# Patient Record
Sex: Female | Born: 1950 | Race: Black or African American | Hispanic: No | Smoking: Former smoker
Health system: Southern US, Community
[De-identification: ages and names within clinical notes are randomized; demographics above are authoritative.]

## PROBLEM LIST (undated history)

## (undated) DIAGNOSIS — E782 Mixed hyperlipidemia: Secondary | ICD-10-CM

## (undated) DIAGNOSIS — I10 Essential (primary) hypertension: Secondary | ICD-10-CM

## (undated) DIAGNOSIS — Z9289 Personal history of other medical treatment: Secondary | ICD-10-CM

## (undated) DIAGNOSIS — Z973 Presence of spectacles and contact lenses: Secondary | ICD-10-CM

## (undated) DIAGNOSIS — Z972 Presence of dental prosthetic device (complete) (partial): Secondary | ICD-10-CM

## (undated) DIAGNOSIS — M1990 Unspecified osteoarthritis, unspecified site: Secondary | ICD-10-CM

## (undated) DIAGNOSIS — R55 Syncope and collapse: Secondary | ICD-10-CM

## (undated) DIAGNOSIS — M5030 Other cervical disc degeneration, unspecified cervical region: Secondary | ICD-10-CM

## (undated) DIAGNOSIS — E785 Hyperlipidemia, unspecified: Secondary | ICD-10-CM

## (undated) HISTORY — PX: COLONOSCOPY: SHX174

## (undated) HISTORY — PX: KNEE ARTHROSCOPY: SUR90

## (undated) HISTORY — DX: Mixed hyperlipidemia: E78.2

## (undated) HISTORY — DX: Hyperlipidemia, unspecified: E78.5

## (undated) HISTORY — DX: Other cervical disc degeneration, unspecified cervical region: M50.30

## (undated) HISTORY — DX: Personal history of other medical treatment: Z92.89

## (undated) HISTORY — DX: Presence of spectacles and contact lenses: Z97.3

## (undated) HISTORY — DX: Presence of dental prosthetic device (complete) (partial): Z97.2

## (undated) HISTORY — DX: Unspecified osteoarthritis, unspecified site: M19.90

## (undated) HISTORY — PX: PARTIAL HYSTERECTOMY: SHX80

## (undated) HISTORY — PX: LIPOMA EXCISION: SHX5283

## (undated) HISTORY — DX: Syncope and collapse: R55

---

## 1998-04-16 ENCOUNTER — Ambulatory Visit (HOSPITAL_COMMUNITY): Admission: RE | Admit: 1998-04-16 | Discharge: 1998-04-16 | Payer: Self-pay | Admitting: Pulmonary Disease

## 2000-10-15 ENCOUNTER — Other Ambulatory Visit: Admission: RE | Admit: 2000-10-15 | Discharge: 2000-10-15 | Payer: Self-pay | Admitting: Unknown Physician Specialty

## 2001-01-30 ENCOUNTER — Ambulatory Visit (HOSPITAL_COMMUNITY): Admission: RE | Admit: 2001-01-30 | Discharge: 2001-01-30 | Payer: Self-pay | Admitting: Pulmonary Disease

## 2001-01-30 ENCOUNTER — Encounter: Payer: Self-pay | Admitting: Pulmonary Disease

## 2001-04-28 ENCOUNTER — Encounter: Admission: RE | Admit: 2001-04-28 | Discharge: 2001-05-26 | Payer: Self-pay | Admitting: Neurosurgery

## 2004-03-27 ENCOUNTER — Ambulatory Visit (HOSPITAL_COMMUNITY): Admission: RE | Admit: 2004-03-27 | Discharge: 2004-03-27 | Payer: Self-pay | Admitting: Pulmonary Disease

## 2005-04-24 ENCOUNTER — Encounter: Admission: RE | Admit: 2005-04-24 | Discharge: 2005-04-24 | Payer: Self-pay | Admitting: General Surgery

## 2005-04-27 ENCOUNTER — Ambulatory Visit (HOSPITAL_BASED_OUTPATIENT_CLINIC_OR_DEPARTMENT_OTHER): Admission: RE | Admit: 2005-04-27 | Discharge: 2005-04-27 | Payer: Self-pay | Admitting: General Surgery

## 2005-04-27 ENCOUNTER — Encounter (INDEPENDENT_AMBULATORY_CARE_PROVIDER_SITE_OTHER): Payer: Self-pay | Admitting: Specialist

## 2005-04-27 ENCOUNTER — Ambulatory Visit (HOSPITAL_COMMUNITY): Admission: RE | Admit: 2005-04-27 | Discharge: 2005-04-27 | Payer: Self-pay | Admitting: General Surgery

## 2005-11-02 DIAGNOSIS — Z9289 Personal history of other medical treatment: Secondary | ICD-10-CM

## 2005-11-02 HISTORY — DX: Personal history of other medical treatment: Z92.89

## 2006-04-12 ENCOUNTER — Ambulatory Visit: Payer: Self-pay | Admitting: Cardiology

## 2006-04-21 ENCOUNTER — Encounter: Payer: Self-pay | Admitting: Cardiovascular Disease

## 2006-04-21 ENCOUNTER — Ambulatory Visit: Payer: Self-pay

## 2008-04-09 ENCOUNTER — Ambulatory Visit (HOSPITAL_COMMUNITY): Admission: RE | Admit: 2008-04-09 | Discharge: 2008-04-09 | Payer: Self-pay | Admitting: Pulmonary Disease

## 2010-11-02 DIAGNOSIS — R55 Syncope and collapse: Secondary | ICD-10-CM

## 2010-11-02 HISTORY — DX: Syncope and collapse: R55

## 2011-03-19 ENCOUNTER — Other Ambulatory Visit (HOSPITAL_COMMUNITY): Payer: Self-pay | Admitting: Pulmonary Disease

## 2011-03-19 DIAGNOSIS — G939 Disorder of brain, unspecified: Secondary | ICD-10-CM

## 2011-03-20 NOTE — Op Note (Signed)
NAME:  Nancy Castro, Nancy Castro               ACCOUNT NO.:  [id number]   MEDICAL RECORD NO.:  [id number]          PATIENT TYPE:  AMB   LOCATION:  DSC                          FACILITY:  [facility]   PHYSICIAN:  Leonie Man, M.D.   DATE OF BIRTH:  [birth year]   DATE OF PROCEDURE:  04/27/2005  DATE OF DISCHARGE:                                 OPERATIVE REPORT   PREOPERATIVE DIAGNOSES:  1.  Lipomata of the right flank x2.  2.  Skin tag of the left upper arm.   POSTOPERATIVE DIAGNOSES:  1.  Lipomata of the right flank x2.  2.  Skin tag of the left upper arm.   PROCEDURE:  1.  Excision of lipoma 15 cm from the right flank.  2.  Excision of lipoma 3 cm from the right flank.  3.  Excision of skin tag the left upper arm.   SURGEON:  Leonie Man, M.D.   ASSISTANT:  OR tech.   ANESTHESIA:  General.   SPECIMENS TO THE LAB:  One 15 cm lipoma, one 3 cm lipoma and a skin tag.   ESTIMATED BLOOD LOSS:  Minimal.   COMPLICATIONS:  No apparent complications. The patient returned to the PACU  in excellent condition.   The patient is a 60 year old female presenting with a large mass of the  right posterior lateral flank which on examination, is felt to be a large  lipoma.  She wishes to have this removed because it is causing some pressure  against her undergarments. She comes to the operating room now after risks  and potential benefits of surgery have been discussed. All questions  answered and consent obtained.  On evaluation, the patient also has a small  lipoma somewhat below this close to the thoracolumbar area and she has an  enlarging irritated skin tag on her left upper arm both of which she wants  also removed.   PROCEDURE:  Following the induction of satisfactory general anesthesia, the  patient is turned in the left lateral recumbent position. The right flank is  then prepped and draped to be included in a sterile operative field.  Incision is made over the larger of the two masses,  deepened through the  skin and subcutaneous tissue, carried down to the capsule of the mass where  the mass is dissected free from the surrounding soft tissues, carried down  to the latissimus dorsi of the back and completely excised.  Forwarded for  pathologic evaluation. Hemostasis assured with electrocautery and sponge and  instrument counts were verified. Subcutaneous tissues closed with  interrupted 3-0 Vicryl sutures and skin closed with running 4-0 Monocryl  suture. Attention then turned to the smaller of the two massed located  somewhat inferior and medial to the first.  This is approached through a  transverse incision, deepened through skin and subcutaneous tissues to the  capsule of the mass.  The mass was dissected free and removed in its  entirety and forwarded for pathologic evaluation. Hemostasis was assured  with electrocautery. The subcutaneous tissues closed with interrupted 3-0  Vicryl sutures and skin closed with a  running 4-0 Monocryl suture. Both  wounds reinforced with Steri-Strips and sterile dressings applied.   Attention then turned to the skin tag on the left forearm where this was  infiltrated with 0.5% Marcaine with epinephrine and the skin tag was cleanly  excised at its base and then cauterized. This was dressed with a Band-Aid.  The anesthetic reversed. The patient removed from the operating room to the  recovery room in stable condition. She tolerated the procedure well.       PB/MEDQ  D:  04/27/2005  T:  04/27/2005  Job:  [id number]

## 2011-03-24 ENCOUNTER — Ambulatory Visit (HOSPITAL_COMMUNITY)
Admission: RE | Admit: 2011-03-24 | Discharge: 2011-03-24 | Disposition: A | Discharge status: Home / Self Care | Payer: BC Managed Care – PPO | Source: Ambulatory Visit | Attending: Pulmonary Disease | Admitting: Pulmonary Disease

## 2011-03-24 ENCOUNTER — Encounter (HOSPITAL_COMMUNITY): Payer: Self-pay

## 2011-03-24 ENCOUNTER — Other Ambulatory Visit (HOSPITAL_COMMUNITY): Payer: Self-pay | Admitting: Pulmonary Disease

## 2011-03-24 DIAGNOSIS — Z9181 History of falling: Secondary | ICD-10-CM | POA: Insufficient documentation

## 2011-03-24 DIAGNOSIS — G939 Disorder of brain, unspecified: Secondary | ICD-10-CM

## 2011-03-24 DIAGNOSIS — R55 Syncope and collapse: Secondary | ICD-10-CM | POA: Insufficient documentation

## 2011-03-24 HISTORY — DX: Essential (primary) hypertension: I10

## 2011-03-24 MED ORDER — IOHEXOL 300 MG/ML  SOLN: 100.0000 mL | Freq: Once | INTRAMUSCULAR | Status: AC | PRN

## 2011-03-25 ENCOUNTER — Ambulatory Visit (HOSPITAL_COMMUNITY)
Admission: RE | Admit: 2011-03-25 | Discharge: 2011-03-25 | Disposition: A | Discharge status: Home / Self Care | Payer: BC Managed Care – PPO | Source: Ambulatory Visit | Attending: Pulmonary Disease | Admitting: Pulmonary Disease

## 2011-03-25 DIAGNOSIS — R55 Syncope and collapse: Secondary | ICD-10-CM

## 2011-04-24 ENCOUNTER — Encounter: Payer: Self-pay | Admitting: Family Medicine

## 2012-02-08 ENCOUNTER — Ambulatory Visit (INDEPENDENT_AMBULATORY_CARE_PROVIDER_SITE_OTHER): Payer: BC Managed Care – PPO | Admitting: Medical

## 2012-02-08 ENCOUNTER — Encounter: Payer: Self-pay | Admitting: Medical

## 2012-02-08 VITALS — BP 170/100 | HR 78 | Temp 98.4°F | Resp 16 | Ht 61.0 in | Wt 145.0 lb

## 2012-02-08 DIAGNOSIS — R55 Syncope and collapse: Secondary | ICD-10-CM

## 2012-02-08 DIAGNOSIS — I10 Essential (primary) hypertension: Secondary | ICD-10-CM | POA: Insufficient documentation

## 2012-02-08 DIAGNOSIS — Z79899 Other long term (current) drug therapy: Secondary | ICD-10-CM

## 2012-02-08 DIAGNOSIS — E785 Hyperlipidemia, unspecified: Secondary | ICD-10-CM | POA: Insufficient documentation

## 2012-02-08 LAB — CBC WITH DIFFERENTIAL/PLATELET
Basophils Absolute: 0 10*3/uL (ref 0.0–0.1)
Basophils Relative: 0 % (ref 0–1)
Eosinophils Absolute: 0 10*3/uL (ref 0.0–0.7)
Eosinophils Relative: 0 % (ref 0–5)
Hemoglobin: 12.9 g/dL (ref 12.0–15.0)
Lymphocytes Relative: 46 % (ref 12–46)
Lymphs Abs: 1.7 10*3/uL (ref 0.7–4.0)
MCHC: 32 g/dL (ref 30.0–36.0)
MCV: 102.8 fL — ABNORMAL HIGH (ref 78.0–100.0)
Neutro Abs: 1.6 10*3/uL — ABNORMAL LOW (ref 1.7–7.7)
Platelets: 318 10*3/uL (ref 150–400)
RBC: 3.92 MIL/uL (ref 3.87–5.11)
RDW: 12.6 % (ref 11.5–15.5)
WBC: 3.7 10*3/uL — ABNORMAL LOW (ref 4.0–10.5)

## 2012-02-08 LAB — COMPREHENSIVE METABOLIC PANEL
ALT: 14 U/L (ref 0–35)
AST: 19 U/L (ref 0–37)
Albumin: 4.5 g/dL (ref 3.5–5.2)
CO2: 27 mEq/L (ref 19–32)
Calcium: 10.4 mg/dL (ref 8.4–10.5)
Chloride: 101 mEq/L (ref 96–112)
Creat: 0.61 mg/dL (ref 0.50–1.10)
Glucose, Bld: 112 mg/dL — ABNORMAL HIGH (ref 70–99)
Sodium: 138 mEq/L (ref 135–145)
Total Bilirubin: 0.8 mg/dL (ref 0.3–1.2)
Total Protein: 7.4 g/dL (ref 6.0–8.3)

## 2012-02-08 LAB — POCT URINALYSIS DIPSTICK
Bilirubin, UA: NEGATIVE
Glucose, UA: NEGATIVE
Ketones, UA: NEGATIVE
Nitrite, UA: NEGATIVE
Spec Grav, UA: 1.025
Urobilinogen, UA: NEGATIVE
pH, UA: 5

## 2012-02-08 LAB — LIPID PANEL
Cholesterol: 277 mg/dL — ABNORMAL HIGH (ref 0–200)
HDL: 139 mg/dL (ref >39)
Total CHOL/HDL Ratio: 2 Ratio
Triglycerides: 81 mg/dL (ref <150)

## 2012-02-08 LAB — TSH: TSH: 2.74 u[IU]/mL (ref 0.350–4.500)

## 2012-02-08 NOTE — Patient Instructions (Signed)
We will check labs today, will try and get copies of prior records, and we will call with lab results.

## 2012-02-08 NOTE — Progress Notes (Signed)
Subjective:   HPI  Nancy Castro is a 61 y.o. female who presents as a new patient today.   She has been seeing Dr. Petra Kuba on [address] the last 18 years, but husband's insurer recently made them changes doctors.  Thus, she is here today to establish.    She would like her BP and cholesterol checked today.  Last check was 9/12, BP was elevated then, but cholesterol was ok.  She does notes some statin intolerance in the past.  Has tried Welchol, Lipitor and both gave her problems.  Can tolerate Crestor.     She does note 2012 having some passing out episodes, went on to have stress testing and head imaging, and no cause could be found.  Last episode was 03/2011.  She ended up quitting her job as she couldn't continue working her job due to dizziness, back pain and syncope.  Ended up seeing cardiology and neurology.  Reviewed their medical, surgical, family, social, medication, and allergy history and updated chart as appropriate.  Past Medical History  Diagnosis Date  . Hypertension   . Hyperlipidemia   . DDD (degenerative disc disease), cervical   . Syncope 2012    cardiac stress test      Review of Systems Constitutional: -fever, -chills, -sweats, -unexpected weight change, -anorexia, -fatigue Allergy: -sneezing, -itching, -congestion Dermatology: denies changing moles, rash, lumps, new worrisome lesions ENT: -runny nose, -ear pain, -sore throat, -hoarseness, -sinus pain, -teeth pain, -tinnitus, -hearing loss, -epistaxis Cardiology:  -chest pain, -palpitations, +edema, -orthopnea, -paroxysmal nocturnal dyspnea Respiratory: -cough, -shortness of breath, -dyspnea on exertion, -wheezing, -hemoptysis Gastroenterology: -abdominal pain, -nausea, -vomiting, -diarrhea, -constipation, -blood in stool, -changes in bowel movement, -dysphagia Hematology: -bleeding or bruising problems Musculoskeletal: -arthralgias, +myalgias, +joint swelling, +back pain, +neck pain, -cramping, -gait  changes Ophthalmology: -vision changes, -eye redness, -itching, -discharge Urology: -dysuria, -difficulty urinating, -hematuria, -urinary frequency, -urgency, incontinence Neurology: -headache, -weakness, -tingling, -numbness, -speech abnormality, -memory loss, +falls x 3, +dizziness Psychology:  -depressed mood, -agitation, +sleep problems      Objective:   Physical Exam  Filed Vitals:   02/08/12 1118  BP: 170/100  Pulse: 78  Temp: 98.4 F (36.9 C)  Resp: 16    General appearance: alert, no distress, WD/WN, pleasant black female HEENT: normocephalic, sclerae anicteric, TMs pearly, nares patent, no discharge or erythema, pharynx normal Oral cavity: MMM, no lesions, upper denture Neck: supple, no lymphadenopathy, no thyromegaly, no masses Heart: RRR, normal S1, S2, no murmurs Lungs: CTA bilaterally, no wheezes, rhonchi, or rales Abdomen: +bs, soft, non tender, non distended, no masses, no hepatomegaly, no splenomegaly Pulses: 2+ symmetric, upper and lower extremities, normal cap refill Neuro: CN2-12 intact, few episodes of clonus with LE exam, DTRs normal, otherwise non focal exam MSK: left index finger and thumb with some bony arthritis changes of DIPs and PIPs, no joint swelling   Assessment and Plan :    Encounter Diagnoses  Name Primary?  . Essential hypertension, benign Yes  . Hyperlipidemia   . Encounter for long-term (current) use of other medications   . Syncope    HTN - she hasn't taken her medication today.  Discussed compliance, not missing doses, and she is reportedly taking Benicar 40mg  daily. Labs today  Hyperlipemia - labs today.  We called [facility] and verified she is taking both Simcor and Trilipix daily.  Syncope - none since 03/2011.  We will request prior records from primary care, urgent care, cardiac stress testing and neurology.  UA today  with blood and urine, microscopic.  Will need to repeat in 2 wk.   Follow-up pending labs.

## 2012-02-12 NOTE — Progress Notes (Signed)
Lmom to cb. CLS 

## 2012-03-24 ENCOUNTER — Ambulatory Visit (INDEPENDENT_AMBULATORY_CARE_PROVIDER_SITE_OTHER): Payer: BC Managed Care – PPO | Admitting: Medical

## 2012-03-24 ENCOUNTER — Encounter: Payer: Self-pay | Admitting: Medical

## 2012-03-24 VITALS — BP 128/90 | HR 84 | Temp 97.7°F | Resp 16 | Wt 145.5 lb

## 2012-03-24 DIAGNOSIS — R3129 Other microscopic hematuria: Secondary | ICD-10-CM

## 2012-03-24 DIAGNOSIS — M62838 Other muscle spasm: Secondary | ICD-10-CM

## 2012-03-24 DIAGNOSIS — E785 Hyperlipidemia, unspecified: Secondary | ICD-10-CM

## 2012-03-24 DIAGNOSIS — I10 Essential (primary) hypertension: Secondary | ICD-10-CM

## 2012-03-24 DIAGNOSIS — R7301 Impaired fasting glucose: Secondary | ICD-10-CM

## 2012-03-24 DIAGNOSIS — M542 Cervicalgia: Secondary | ICD-10-CM

## 2012-03-24 LAB — POCT URINALYSIS DIPSTICK
Bilirubin, UA: NEGATIVE
Blood, UA: NEGATIVE
Glucose, UA: NEGATIVE
Ketones, UA: NEGATIVE
Spec Grav, UA: 1.015
Urobilinogen, UA: NEGATIVE
pH, UA: 5

## 2012-03-24 LAB — POCT GLYCOSYLATED HEMOGLOBIN (HGB A1C): Hemoglobin A1C: 114

## 2012-03-24 LAB — POCT CBG (FASTING - GLUCOSE)-MANUAL ENTRY: Glucose Fasting, POC: 5.3 mg/dL (ref 70–99)

## 2012-03-24 MED ORDER — MELOXICAM 15 MG PO TABS: 15.0000 mg | ORAL_TABLET | Freq: Every day | ORAL | Status: DC

## 2012-03-24 MED ORDER — NIACIN-SIMVASTATIN ER 500-20 MG PO TB24: 1.0000 | ORAL_TABLET | Freq: Every day | ORAL | Status: DC

## 2012-03-24 MED ORDER — OLMESARTAN MEDOXOMIL 40 MG PO TABS: 40.0000 mg | ORAL_TABLET | Freq: Every day | ORAL | Status: DC

## 2012-03-24 NOTE — Progress Notes (Signed)
  Subjective:   HPI  Nancy Castro is a 61 y.o. female who presents with 23mo recheck.  Since last visit started back on her blood pressure medication, is complaint with her cholesterol medication.    She notes longstanding problems with neck pain, hx/o disc disease in the neck.  Has been on Flexeril 10mg  BID for neck for a long time and this doesn't help.  Denies much exercise.  No other aggravating or relieving factors.    No other c/o.  The following portions of the patient's history were reviewed and updated as appropriate: allergies, current medications, past family history, past medical history, past social history, past surgical history and problem list.  Past Medical History  Diagnosis Date  . Hypertension   . Hyperlipidemia   . DDD (degenerative disc disease), cervical   . Syncope 2012    cardiac stress test   . Osteoarthritis     No Known Allergies   Review of Systems ROS reviewed and was negative other than noted in HPI or above.    Objective:   Physical Exam  General appearance: alert, no distress, WD/WN Oral cavity: MMM, no lesions Neck: supple, no lymphadenopathy, no thyromegaly, no masses, tender posterior and lateral neck, mild pain with ROM Back: tender supraspinatus, tender trapezius, otherwise nontender Heart: RRR, normal S1, S2, no murmurs Lungs: CTA bilaterally, no wheezes, rhonchi, or rales Pulses: 2+ symmetric, upper extremities MSK: UE nontender and normal ROM Neuro: normal UE strength and sensation and DTRs  Assessment and Plan :     Encounter Diagnoses  Name Primary?  . Essential hypertension, benign Yes  . Microscopic hematuria   . Impaired fasting blood sugar   . Cervicalgia   . Muscle spasm   . Hyperlipidemia    HTN - c/t same medication, refills today, reviewed her recent labs, recheck 236mo  Hematuria - urinalysis today normal, no further workup.   Impaired fasting glucose - fasting glucose today 114, HgbA1C of 5.3%.  Discussed diet  and regular exercise.   Recheck 236mo.   Cervicalgia and muscle spasm - discussed daily stretching routine, consider That Chi, and will begin trial of Mobic daily.   Hyperlipidemia - c/t same medication, recheck 236mo.   Discuss preventative care next visit.  She is UTD on mammogram, but never had a colonoscopy.

## 2012-04-22 ENCOUNTER — Telehealth: Payer: Self-pay | Admitting: Medical

## 2012-04-22 NOTE — Telephone Encounter (Signed)
LM

## 2012-05-10 DIAGNOSIS — Z0271 Encounter for disability determination: Secondary | ICD-10-CM

## 2012-05-20 ENCOUNTER — Telehealth: Payer: Self-pay | Admitting: Medical

## 2012-05-20 ENCOUNTER — Other Ambulatory Visit: Payer: Self-pay | Admitting: Medical

## 2012-05-20 MED ORDER — CYCLOBENZAPRINE HCL 10 MG PO TABS: 10.0000 mg | ORAL_TABLET | Freq: Two times a day (BID) | ORAL | Status: DC | PRN

## 2012-05-20 NOTE — Telephone Encounter (Signed)
Pt called and stated that mobic is causing her to be dizzy. She wants to go back on flexeril. Pt uses [facility].

## 2012-05-20 NOTE — Telephone Encounter (Signed)
Patient states that she didn't say that the flexrill didn't help. She states that is what she wanted to get back on. CLS

## 2012-05-20 NOTE — Telephone Encounter (Signed)
She had told me that flexeril didn't help.  We can try different anti-inflammatory if she'd like.  Stop the mobic if this is making her feel funny.  Would she be interested in seeing physical therapy for the neck issue?

## 2012-06-22 ENCOUNTER — Telehealth: Payer: Self-pay | Admitting: Internal Medicine

## 2012-06-22 MED ORDER — OLMESARTAN MEDOXOMIL 40 MG PO TABS: 40.0000 mg | ORAL_TABLET | Freq: Every day | ORAL | Status: DC

## 2012-06-22 MED ORDER — NIACIN-SIMVASTATIN ER 500-20 MG PO TB24: 1.0000 | ORAL_TABLET | Freq: Every day | ORAL | Status: DC

## 2012-06-22 NOTE — Telephone Encounter (Signed)
[facility] refills [facility]. CLS

## 2012-06-22 NOTE — Telephone Encounter (Signed)
[facility]

## 2012-09-26 ENCOUNTER — Other Ambulatory Visit: Payer: Self-pay | Admitting: Medical

## 2012-09-26 NOTE — Telephone Encounter (Signed)
Patient is aware that she needs a follow up appointment and she has an appointment for 10/10/12 @ 900 am. CLS

## 2012-09-26 NOTE — Telephone Encounter (Signed)
Rx refill on Flexeril.

## 2012-09-26 NOTE — Telephone Encounter (Signed)
pls make f/u appt.  Last visit, I believe she had elevated glucose and due for recheck at this time

## 2012-10-10 ENCOUNTER — Ambulatory Visit
Admission: RE | Admit: 2012-10-10 | Discharge: 2012-10-10 | Disposition: A | Discharge status: Home / Self Care | Payer: BC Managed Care – PPO | Source: Ambulatory Visit | Attending: Medical | Admitting: Medical

## 2012-10-10 ENCOUNTER — Ambulatory Visit (INDEPENDENT_AMBULATORY_CARE_PROVIDER_SITE_OTHER): Payer: BC Managed Care – PPO | Admitting: Medical

## 2012-10-10 ENCOUNTER — Encounter: Payer: Self-pay | Admitting: Medical

## 2012-10-10 VITALS — BP 120/80 | HR 80 | Temp 98.1°F | Resp 16 | Wt 151.0 lb

## 2012-10-10 DIAGNOSIS — Z23 Encounter for immunization: Secondary | ICD-10-CM

## 2012-10-10 DIAGNOSIS — E785 Hyperlipidemia, unspecified: Secondary | ICD-10-CM

## 2012-10-10 DIAGNOSIS — M25449 Effusion, unspecified hand: Secondary | ICD-10-CM

## 2012-10-10 DIAGNOSIS — M19049 Primary osteoarthritis, unspecified hand: Secondary | ICD-10-CM

## 2012-10-10 DIAGNOSIS — I10 Essential (primary) hypertension: Secondary | ICD-10-CM

## 2012-10-10 DIAGNOSIS — M542 Cervicalgia: Secondary | ICD-10-CM

## 2012-10-10 LAB — HEPATIC FUNCTION PANEL
ALT: 15 U/L (ref 0–35)
AST: 18 U/L (ref 0–37)
Albumin: 4.6 g/dL (ref 3.5–5.2)
Alkaline Phosphatase: 52 U/L (ref 39–117)
Total Bilirubin: 0.5 mg/dL (ref 0.3–1.2)
Total Protein: 6.9 g/dL (ref 6.0–8.3)

## 2012-10-10 LAB — LIPID PANEL
Cholesterol: 209 mg/dL — ABNORMAL HIGH (ref 0–200)
HDL: 87 mg/dL (ref >39)

## 2012-10-10 MED ORDER — CYCLOBENZAPRINE HCL 10 MG PO TABS: 10.0000 mg | ORAL_TABLET | Freq: Two times a day (BID) | ORAL | Status: DC | PRN

## 2012-10-10 MED ORDER — IBUPROFEN 800 MG PO TABS: 800.0000 mg | ORAL_TABLET | Freq: Two times a day (BID) | ORAL | Status: DC | PRN

## 2012-10-10 MED ORDER — OLMESARTAN MEDOXOMIL 40 MG PO TABS: 40.0000 mg | ORAL_TABLET | Freq: Every day | ORAL | Status: DC

## 2012-10-10 NOTE — Progress Notes (Signed)
Subjective: Here for f/u on several issues.   I saw her as new patient 02/2012.  She is here for f/u on things and other ongoing problems.  Hypertension - compliant with medication, no c/o.  Dyslipidemia - complaint with both Trilipix and Simcor.  exercises with walking, tries to eat healthy.  Neck pain ongoing.  Been on Flexeril BID for years.  Didn't tolerate mobic due to dizziness.  Her health care plan recommended she try Ibuprofen instead.  Hx/o consult with neurosurgery who recommended surgery, but she did not want to pursue this as that time.  Has changes joint bony changes and swelling of both hands.  Gets similar swelling in right knee.  Prior neurosurgeon advised that her left hand swells due to the neck issues, but she has new worsening bony changes.  She notes some morning stiffness but it doesn't last very long.  Often the hand pains improve as the day goes on.  She thinks she may have been diagnosed with RA, but not sure.   Says Dr. Petra Kuba did labs on her in 2010 or 2011 related to this.   Needs 90 day supply on most of her medications. Uses [facility].  Past Medical History  Diagnosis Date  . Hypertension   . Hyperlipidemia   . DDD (degenerative disc disease), cervical   . Syncope 2012    cardiac stress test   . Osteoarthritis    ROS as in HPI    Objective:   Physical Exam  Filed Vitals:   10/10/12 0909  BP: 120/80  Pulse: 80  Temp: 98.1 F (36.7 C)  Resp: 16    General appearance: alert, no distress, WD/WN, AA female Neck: mild bilat and posterior tenderness, ROM limited to about 75% of normal in all directions, no lymphadenopathy, no thyromegaly, no masses Heart: RRR, normal S1, S2, no murmurs Lungs: CTA bilaterally, no wheezes, rhonchi, or rales MSK: right hand with bony changes of index finger MCP and DIP, bony changes at thumb MCP, otherwise no bony changes, no swelling, rest of UE unremarkable Skin: unremarkable Pulses normal Ext: no obvious swelling,  cyanosis or clubbing Neuro: normal strength, sensation and DTRs UE  Assessment and Plan :    Encounter Diagnoses  Name Primary?  . Cervicalgia Yes  . Arthritis of hand   . Swelling of hand joint   . Dyslipidemia   . Essential hypertension, benign   . 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 DISC PROTRUSION IS ALSO NOTED POSTEROLATERALLY ON THE LEFT AT C5-6 EXTENDING TOWARDS THE FORAMEN. THERE IS A LARGER FORAMINAL HERNIATED NUCLEUS PULPOSUS ON THE LEFT AT C6-7. GIVEN THE FINDINGS JUST DESCRIBED, I FEEL THAT THE LEFT C6 AND LEFT C7 NERVE ROOTS  ARE AT RISK FOR COMPRESSION. DISC PROTRUSION POSTEROLATERALLY ON THE RIGHT AT C3-4 IS ALSO NOTED. BROAD BASED POSTERIOR ANNULAR BULGING/SUBLIGAMENTOUS DISC PROTRUSION AT C4-5.  She has used Flexeril long term BID.  She has seen neurosurgery prior.   Advised that at some point she may want to consider surgery if things worsen more.  C/t current medication management, c/t stretching and routine exercise.  At her request, we will hold off on additional eval at this time.  Will request prior records.   Arthritis hand, swelling of hand joints - will request records from prior rheumatology screening labs and prior records.  Sent for xray of both hands.  Stop mobic, begin trial of Ibuprofen.  Discussed risks  of NSAIDs including possible gastritis, ulcer disease, kidney and liver effects. Advised she limit this to 1-2 times daily prn.   Dyslipidemia - labs today.  pending labs, will need refills.  HTN - controlled, refills sent.  Flu vaccine, vaccine counseling and VIS given.  Follow-up pending labs

## 2012-10-11 ENCOUNTER — Other Ambulatory Visit: Payer: Self-pay | Admitting: Medical

## 2012-10-11 MED ORDER — CHOLINE FENOFIBRATE 135 MG PO CPDR: 135.0000 mg | DELAYED_RELEASE_CAPSULE | Freq: Every day | ORAL | Status: DC

## 2012-10-11 MED ORDER — NIACIN-SIMVASTATIN ER 500-20 MG PO TB24: 1.0000 | ORAL_TABLET | Freq: Every day | ORAL | Status: DC

## 2012-10-17 ENCOUNTER — Telehealth: Payer: Self-pay | Admitting: Medical

## 2012-10-19 NOTE — Telephone Encounter (Signed)
lm

## 2013-02-28 ENCOUNTER — Encounter: Payer: Self-pay | Admitting: Gastroenterology

## 2013-02-28 ENCOUNTER — Ambulatory Visit (INDEPENDENT_AMBULATORY_CARE_PROVIDER_SITE_OTHER): Payer: BC Managed Care – PPO | Admitting: Medical

## 2013-02-28 ENCOUNTER — Encounter: Payer: Self-pay | Admitting: Medical

## 2013-02-28 ENCOUNTER — Telehealth: Payer: Self-pay | Admitting: Family Medicine

## 2013-02-28 VITALS — BP 150/90 | HR 88 | Temp 98.1°F | Resp 16 | Ht 61.0 in | Wt 150.0 lb

## 2013-02-28 DIAGNOSIS — M25569 Pain in unspecified knee: Secondary | ICD-10-CM

## 2013-02-28 DIAGNOSIS — M25561 Pain in right knee: Secondary | ICD-10-CM

## 2013-02-28 DIAGNOSIS — I10 Essential (primary) hypertension: Secondary | ICD-10-CM

## 2013-02-28 DIAGNOSIS — R3129 Other microscopic hematuria: Secondary | ICD-10-CM

## 2013-02-28 DIAGNOSIS — Z13820 Encounter for screening for osteoporosis: Secondary | ICD-10-CM

## 2013-02-28 DIAGNOSIS — Z0000 Encounter for general adult medical examination without abnormal findings: Secondary | ICD-10-CM

## 2013-02-28 DIAGNOSIS — Z1211 Encounter for screening for malignant neoplasm of colon: Secondary | ICD-10-CM

## 2013-02-28 DIAGNOSIS — Z23 Encounter for immunization: Secondary | ICD-10-CM

## 2013-02-28 DIAGNOSIS — E782 Mixed hyperlipidemia: Secondary | ICD-10-CM | POA: Insufficient documentation

## 2013-02-28 LAB — CBC WITH DIFFERENTIAL/PLATELET
Basophils Absolute: 0 10*3/uL (ref 0.0–0.1)
Basophils Relative: 1 % (ref 0–1)
Eosinophils Absolute: 0 10*3/uL (ref 0.0–0.7)
HCT: 34.9 % — ABNORMAL LOW (ref 36.0–46.0)
Hemoglobin: 12.2 g/dL (ref 12.0–15.0)
Lymphocytes Relative: 48 % — ABNORMAL HIGH (ref 12–46)
Lymphs Abs: 1.6 10*3/uL (ref 0.7–4.0)
MCHC: 35 g/dL (ref 30.0–36.0)
Monocytes Absolute: 0.3 10*3/uL (ref 0.1–1.0)
Monocytes Relative: 9 % (ref 3–12)
Neutro Abs: 1.4 10*3/uL — ABNORMAL LOW (ref 1.7–7.7)
Neutrophils Relative %: 42 % — ABNORMAL LOW (ref 43–77)
RBC: 3.65 MIL/uL — ABNORMAL LOW (ref 3.87–5.11)
WBC: 3.2 10*3/uL — ABNORMAL LOW (ref 4.0–10.5)

## 2013-02-28 LAB — POCT URINALYSIS DIPSTICK
Bilirubin, UA: NEGATIVE
Glucose, UA: NEGATIVE
Ketones, UA: NEGATIVE
Nitrite, UA: NEGATIVE
pH, UA: 5

## 2013-02-28 LAB — LIPID PANEL
Cholesterol: 340 mg/dL — ABNORMAL HIGH (ref 0–200)
HDL: 116 mg/dL (ref >39)
LDL Cholesterol: 196 mg/dL — ABNORMAL HIGH (ref 0–99)
Total CHOL/HDL Ratio: 2.9 Ratio
Triglycerides: 141 mg/dL (ref <150)
VLDL: 28 mg/dL (ref 0–40)

## 2013-02-28 LAB — COMPREHENSIVE METABOLIC PANEL
ALT: 21 U/L (ref 0–35)
AST: 28 U/L (ref 0–37)
BUN: 16 mg/dL (ref 6–23)
CO2: 28 mEq/L (ref 19–32)
Calcium: 10.1 mg/dL (ref 8.4–10.5)
Chloride: 102 mEq/L (ref 96–112)
Creat: 0.58 mg/dL (ref 0.50–1.10)
Total Bilirubin: 0.6 mg/dL (ref 0.3–1.2)

## 2013-02-28 LAB — TSH: TSH: 2.426 u[IU]/mL (ref 0.350–4.500)

## 2013-02-28 NOTE — Telephone Encounter (Signed)
Patient is aware of her appointment for her colonscopy on May 28th, 2014 with Dr. Jarold Motto and her nurse visit on May 14,14 @ 230 pm. CLS [facility] [phone]

## 2013-02-28 NOTE — Progress Notes (Signed)
Subjective:   HPI  Nancy Castro is a 61 y.o. female who presents for a complete physical.   Preventative care: Last ophthalmology visit:Yes - [place]- 06/2012 Last dental visit:yes- [address] Last colonoscopy:never Last mammogram:2013  Last gynecological exam: sees gynecology Last EKG:yes- 2010 Last labs:02/08/2012  Prior vaccinations: TD or Tdap:2010 Influenza:10/2012 Pneumococcal:n/a Shingles/Zostavax:n/a  Advanced directive:n/a Health care power of attorney:n/a Living will:n/a  Concerns: hypertension - compliant, usually numbers are in normal range.  Today its up because she was rushing to get here.     hyperlipidemia - compliant with medication.    Right knee gets swelling occasional.   This is the knee she had surgery on in the past.  No recent fall, injury.  Reviewed their medical, surgical, family, social, medication, and allergy history and updated chart as appropriate.   Past Medical History  Diagnosis Date  . Hypertension   . DDD (degenerative disc disease), cervical   . Syncope 2012    cardiac stress test   . Osteoarthritis   . Wears glasses   . Wears partial dentures     upper and lower  . H/O mammogram 2013  . Mixed dyslipidemia   . History of exercise stress test 2007  . H/O echocardiogram 2007    Past Surgical History  Procedure Laterality Date  . Partial hysterectomy      fibroids; has both ovaries  . Knee arthroscopy      right  . Lipoma excision      back x 2  . Colonoscopy      never    Family History  Problem Relation Age of Onset  . Cancer Mother     died of lung cancer  . Cancer Father     died of lung cancer, mets to brain  . Hypertension Maternal Grandmother   . Heart disease Neg Hx   . Stroke Neg Hx   . Diabetes Neg Hx   . Cancer Sister     lung, mets to brain    History   Social History  . Marital Status: Married    Spouse Name: N/A    Number of Children: N/A  . Years of Education: N/A   Occupational  History  . Not on file.   Social History Main Topics  . Smoking status: Former Smoker    Quit date: 02/08/1987  . Smokeless tobacco: Not on file  . Alcohol Use: No  . Drug Use: No  . Sexually Active: Not on file   Other Topics Concern  . Not on file   Social History Narrative   Married, has 2 children, exercise - treadmill, most days of the week, retired, Oceanographer prior.  No grandchildren.   Sews - hobby    Current Outpatient Prescriptions on File Prior to Visit  Medication Sig Dispense Refill  . aspirin 81 MG tablet Take 81 mg by mouth daily.      . Choline Fenofibrate (TRILIPIX) 135 MG capsule Take 1 capsule (135 mg total) by mouth daily.  90 capsule  1  . cyclobenzaprine (FLEXERIL) 10 MG tablet Take 1 tablet (10 mg total) by mouth 2 (two) times daily as needed.  180 tablet  0  . ibuprofen (ADVIL,MOTRIN) 800 MG tablet Take 1 tablet (800 mg total) by mouth 2 (two) times daily as needed for pain.  60 tablet  1  . niacin-simvastatin (SIMCOR) 500-20 MG 24 hr tablet Take 1 tablet by mouth at bedtime.  90 tablet  1  . olmesartan (BENICAR) 40 MG tablet Take 1 tablet (40 mg total) by mouth daily.  90 tablet  3   No current facility-administered medications on file prior to visit.    No Known Allergies   Review of Systems Constitutional: -fever, -chills, -sweats, -unexpected weight change, -decreased appetite, -fatigue Allergy: -sneezing, -itching, -congestion Dermatology: -changing moles, --rash, -lumps ENT: -runny nose, -ear pain, -sore throat, -hoarseness, -sinus pain, -teeth pain, - ringing in ears, -hearing loss, -nosebleeds Cardiology: -chest pain, -palpitations, +swelling, -difficulty breathing when lying flat, -waking up short of breath Respiratory: -cough, -shortness of breath, -difficulty breathing with exercise or exertion, -wheezing, -coughing up blood Gastroenterology: -abdominal pain, -nausea, -vomiting, -diarrhea, -constipation, -blood in stool,  -changes in bowel movement, -difficulty swallowing or eating Hematology: -bleeding, -bruising  Musculoskeletal: -joint aches, -muscle aches, +joint swelling, -back pain, +neck pain, -cramping, -changes in gait Ophthalmology: denies vision changes, eye redness, itching, discharge Urology: -burning with urination, -difficulty urinating, -blood in urine, -urinary frequency, -urgency, -incontinence Neurology: -headache, -weakness, -tingling, -numbness, -memory loss, -falls, -dizziness Psychology: -depressed mood, -agitation, -sleep problems     Objective:   Physical Exam  Filed Vitals:   02/28/13 0931  BP: 150/90  Pulse: 88  Temp: 98.1 F (36.7 C)  Resp: 16    General appearance: alert, no distress, WD/WN, AA female Skin: flat patchy brown birth mark on right lateral shoulder HEENT: normocephalic, conjunctiva/corneas normal, sclerae anicteric, PERRLA, EOMi, nares patent, no discharge or erythema, pharynx normal Oral cavity: MMM, tongue normal, teeth with dentures present upper and lower Neck: supple, no lymphadenopathy, no thyromegaly, no masses, normal ROM, no bruits Chest: non tender, normal shape and expansion Heart: RRR, normal S1, S2, no murmurs Lungs: CTA bilaterally, no wheezes, rhonchi, or rales Abdomen: +bs, soft, non tender, non distended, no masses, no hepatomegaly, no splenomegaly, no bruits Back: non tender, normal ROM, no scoliosis Musculoskeletal: mild swelling of right knee, surgical scar, mild pain with flexion and extension, otherwise upper extremities non tender, no obvious deformity, normal ROM throughout, lower extremities non tender, no obvious deformity, normal ROM throughout Extremities: no edema, no cyanosis, no clubbing Pulses: 2+ symmetric, upper and lower extremities, normal cap refill Neurological: alert, oriented x 3, CN2-12 intact, strength normal upper extremities and lower extremities, sensation normal throughout, DTRs 2+ throughout, no cerebellar signs,  gait normal Psychiatric: normal affect, behavior normal, pleasant  Breast/gyn/rectal - deferred to gynecology    Adult ECG Report  Indication: physical, hypertension  Rate: 81 bpm  Rhythm: normal sinus rhythm  QRS Axis: 13 degrees  PR Interval: 130 ms  QRS Duration: 80ms  QTc:  Conduction Disturbances: none  Other Abnormalities: none  Patient's cardiac risk factors are: dyslipidemia and hypertension.  EKG comparison: none   Narrative Interpretation: normal EKG     Assessment and Plan :    Encounter Diagnoses  Name Primary?  . Routine general medical examination at a health care facility Yes  . Mixed dyslipidemia   . Essential hypertension, benign   . Knee pain, right   . Special screening for malignant neoplasms, colon   . Microscopic hematuria   . Screening for osteoporosis   . Need for shingles vaccine      Physical exam - discussed healthy lifestyle, diet, exercise, preventative care, vaccinations, and addressed their concerns.  Handout given.  Patient Instructions  Thank you for coming in for your physical today.   Make sure you see your gynecologist, eye doctor, and dentist yearly.  High cholesterol - continue  your current medications.  We will call with lab results and recommendations.  Eat a healthy low fat diet.  Hypertension - continue your current medication daily, exercise regularly, avoid added salt in your diet.  Knee pain - you can use some OTC Aleve occasionally.  Elevated the leg when swollen.  Consider ice pack 20 minutes on, 20 minutes off twice daily when painful or swollen.  Avoid deep bending.   If this worsens let me know . This is likely osteoarthritis changes  We will refer you for your first screening colonoscopy.  At some point in the next year we will consider screening for osteoporosis with a bone density scan.    Call your insurance company and ask about coverage for shingles vaccine.

## 2013-02-28 NOTE — Patient Instructions (Signed)
Thank you for coming in for your physical today.   Make sure you see your gynecologist, eye doctor, and dentist yearly.  High cholesterol - continue your current medications.  We will call with lab results and recommendations.  Eat a healthy low fat diet.  Hypertension - continue your current medication daily, exercise regularly, avoid added salt in your diet.  Knee pain - you can use some OTC Aleve occasionally.  Elevated the leg when swollen.  Consider ice pack 20 minutes on, 20 minutes off twice daily when painful or swollen.  Avoid deep bending.   If this worsens let me know . This is likely osteoarthritis changes  We will refer you for your first screening colonoscopy.  At some point in the next year we will consider screening for osteoporosis with a bone density scan.    Call your insurance company and ask about coverage for shingles vaccine.

## 2013-03-01 ENCOUNTER — Other Ambulatory Visit: Payer: Self-pay | Admitting: Medical

## 2013-03-01 MED ORDER — CHOLINE FENOFIBRATE 135 MG PO CPDR: 135.0000 mg | DELAYED_RELEASE_CAPSULE | Freq: Every day | ORAL | Status: DC

## 2013-03-01 MED ORDER — OLMESARTAN MEDOXOMIL 40 MG PO TABS: 40.0000 mg | ORAL_TABLET | Freq: Every day | ORAL | Status: DC

## 2013-03-01 MED ORDER — ATORVASTATIN CALCIUM 40 MG PO TABS: 40.0000 mg | ORAL_TABLET | Freq: Every day | ORAL | Status: DC

## 2013-03-01 MED ORDER — NIACIN ER (ANTIHYPERLIPIDEMIC) 500 MG PO TBCR: 500.0000 mg | EXTENDED_RELEASE_TABLET | Freq: Every day | ORAL | Status: DC

## 2013-03-04 LAB — URINE CULTURE: Colony Count: >=100000

## 2013-03-05 ENCOUNTER — Other Ambulatory Visit: Payer: Self-pay | Admitting: Medical

## 2013-03-05 MED ORDER — CIPROFLOXACIN HCL 500 MG PO TABS: 500.0000 mg | ORAL_TABLET | Freq: Two times a day (BID) | ORAL | Status: DC

## 2013-03-15 ENCOUNTER — Ambulatory Visit (AMBULATORY_SURGERY_CENTER): Payer: BC Managed Care – PPO | Admitting: *Deleted

## 2013-03-15 ENCOUNTER — Encounter: Payer: Self-pay | Admitting: Gastroenterology

## 2013-03-15 VITALS — Ht 61.0 in | Wt 150.2 lb

## 2013-03-15 DIAGNOSIS — Z1211 Encounter for screening for malignant neoplasm of colon: Secondary | ICD-10-CM

## 2013-03-15 MED ORDER — MOVIPREP 100 G PO SOLR: ORAL | Status: DC

## 2013-03-20 ENCOUNTER — Other Ambulatory Visit (INDEPENDENT_AMBULATORY_CARE_PROVIDER_SITE_OTHER): Payer: BC Managed Care – PPO

## 2013-03-20 DIAGNOSIS — R3129 Other microscopic hematuria: Secondary | ICD-10-CM

## 2013-03-20 LAB — POCT URINALYSIS DIPSTICK
Bilirubin, UA: NEGATIVE
Blood, UA: NEGATIVE
Ketones, UA: NEGATIVE
Leukocytes, UA: NEGATIVE
Nitrite, UA: NEGATIVE
Spec Grav, UA: 1.02
Urobilinogen, UA: NEGATIVE
pH, UA: 5

## 2013-03-29 ENCOUNTER — Ambulatory Visit (AMBULATORY_SURGERY_CENTER): Payer: BC Managed Care – PPO | Admitting: Gastroenterology

## 2013-03-29 ENCOUNTER — Encounter: Payer: Self-pay | Admitting: Gastroenterology

## 2013-03-29 VITALS — BP 131/78 | HR 68 | Temp 98.4°F | Resp 18 | Ht 61.0 in | Wt 150.0 lb

## 2013-03-29 DIAGNOSIS — Z1211 Encounter for screening for malignant neoplasm of colon: Secondary | ICD-10-CM

## 2013-03-29 DIAGNOSIS — D126 Benign neoplasm of colon, unspecified: Secondary | ICD-10-CM

## 2013-03-29 DIAGNOSIS — K5730 Diverticulosis of large intestine without perforation or abscess without bleeding: Secondary | ICD-10-CM

## 2013-03-29 MED ORDER — SODIUM CHLORIDE 0.9 % IV SOLN: 500.0000 mL | INTRAVENOUS | Status: DC

## 2013-03-29 NOTE — Progress Notes (Signed)
Called to room to assist during endoscopic procedure.  Patient ID and intended procedure confirmed with present staff. Received instructions for my participation in the procedure from the performing physician.  

## 2013-03-29 NOTE — Progress Notes (Signed)
Patient did not experience any of the following events: a burn prior to discharge; a fall within the facility; wrong site/side/patient/procedure/implant event; or a hospital transfer or hospital admission upon discharge from the facility. (G8907) Patient did not have preoperative order for IV antibiotic SSI prophylaxis. (G8918) Patient did not have preoperative order for IV antibiotic SSI prophylaxis. (G8918)  

## 2013-03-29 NOTE — Op Note (Signed)
[facility] [address] [place], [address]   COLONOSCOPY PROCEDURE REPORT  PATIENT: Nancy Castro, Nancy Castro  MR#: [id number] BIRTHDATE: 1951-10-12 , 61  yrs. old GENDER: Female ENDOSCOPIST: Mardella Layman, MD, [facility] REFERRED BY:  Kristian Covey, PA-C PROCEDURE DATE:  03/29/2013 PROCEDURE:   Colonoscopy with snare polypectomy ASA CLASS:   Class II INDICATIONS:Average risk patient for colon cancer. MEDICATIONS: propofol (Diprivan) 400mg  IV  DESCRIPTION OF PROCEDURE:   After the risks and benefits and of the procedure were explained, informed consent was obtained.  A digital rectal exam revealed no abnormalities of the rectum.    The [id number]  endoscope was introduced through the anus and advanced to the cecum, which was identified by both the appendix and ileocecal valve .  The quality of the prep was excellent, using MoviPrep .  The instrument was then slowly withdrawn as the colon was fully examined.     COLON FINDINGS: There was moderate diverticulosis noted throughout the entire examined colon with associated tortuosity, muscular hypertrophy and colonic spasm.   Three firm sessile polyps ranging between 3-43mm in size were found in the right colon.  A polypectomy was performed using snare cautery.  The resection was complete and the polyp tissue was completely retrieved.   A polypoid shaped pedunculated polyp ranging between 5-73mm in size was found at the splenic flexure.  A polypectomy was performed using snare cautery. The resection was complete and the polyp tissue was completely retrieved.   A smooth sessile polyp ranging between 3-79mm in size was found in the sigmoid colon.  A polypectomy was performed using snare cautery.  The resection was complete and the polyp tissue was completely retrieved.     Retroflexed views revealed no abnormalities.     The scope was then withdrawn from the patient and the procedure  completed.  COMPLICATIONS: There were no complications. ENDOSCOPIC IMPRESSION: 1.   There was moderate diverticulosis noted throughout the entire examined colon 2.   Three sessile polyps ranging between 3-10mm in size were found in the left colon; polypectomy was performed using snare cautery 3.   Pedunculated polyp ranging between 10-12 mm in size was found at the splenic flexure; polypectomy was performed using snare cautery 4.   Sessile polyp ranging between 3-9mm in size was found in the sigmoid colon; polypectomy was performed using snare cautery  RECOMMENDATIONS: 1.  Await pathology results 2.  Repeat Colonoscopy in 3 years.   REPEAT EXAM:  cc:  _______________________________ eSignedMardella Layman, MD, [facility] 03/29/2013 11:19 AM     PATIENT NAME:  Nancy Castro, Nancy Castro MR#: [id number]

## 2013-03-29 NOTE — Patient Instructions (Addendum)

## 2013-03-30 ENCOUNTER — Telehealth: Payer: Self-pay | Admitting: *Deleted

## 2013-03-30 NOTE — Telephone Encounter (Signed)
  Follow up Call-  Call back number 03/29/2013  Post procedure Call Back phone  # [phone]  Permission to leave phone message No     Patient questions:  Do you have a fever, pain , or abdominal swelling? no Pain Score  0 *  Have you tolerated food without any problems? yes  Have you been able to return to your normal activities? yes  Do you have any questions about your discharge instructions: Diet   no Medications  no Follow up visit  no  Do you have questions or concerns about your Care? no  Actions: * If pain score is 4 or above: No action needed, pain <4.

## 2013-04-04 ENCOUNTER — Encounter: Payer: Self-pay | Admitting: Gastroenterology

## 2013-09-20 LAB — HM MAMMOGRAPHY

## 2013-09-21 ENCOUNTER — Encounter: Payer: Self-pay | Admitting: Internal Medicine

## 2013-10-24 ENCOUNTER — Telehealth: Payer: Self-pay | Admitting: Family Medicine

## 2013-10-24 MED ORDER — ATORVASTATIN CALCIUM 40 MG PO TABS: 40.0000 mg | ORAL_TABLET | Freq: Once | ORAL | Status: DC

## 2013-10-24 NOTE — Telephone Encounter (Signed)
Done

## 2013-11-11 ENCOUNTER — Other Ambulatory Visit: Payer: Self-pay | Admitting: Medical

## 2013-11-13 NOTE — Telephone Encounter (Signed)
Is this okay to refill? 

## 2013-11-17 ENCOUNTER — Other Ambulatory Visit: Payer: Self-pay | Admitting: Medical

## 2014-03-15 ENCOUNTER — Ambulatory Visit (INDEPENDENT_AMBULATORY_CARE_PROVIDER_SITE_OTHER): Payer: BC Managed Care – PPO | Admitting: Medical

## 2014-03-15 ENCOUNTER — Encounter: Payer: Self-pay | Admitting: Medical

## 2014-03-15 VITALS — BP 130/70 | HR 80 | Temp 98.0°F | Resp 18 | Ht 61.0 in | Wt 143.0 lb

## 2014-03-15 DIAGNOSIS — I10 Essential (primary) hypertension: Secondary | ICD-10-CM

## 2014-03-15 DIAGNOSIS — Z0000 Encounter for general adult medical examination without abnormal findings: Secondary | ICD-10-CM

## 2014-03-15 DIAGNOSIS — R7301 Impaired fasting glucose: Secondary | ICD-10-CM

## 2014-03-15 DIAGNOSIS — E782 Mixed hyperlipidemia: Secondary | ICD-10-CM

## 2014-03-15 DIAGNOSIS — M1990 Unspecified osteoarthritis, unspecified site: Secondary | ICD-10-CM

## 2014-03-15 DIAGNOSIS — Z2882 Immunization not carried out because of caregiver refusal: Secondary | ICD-10-CM

## 2014-03-15 DIAGNOSIS — M2540 Effusion, unspecified joint: Secondary | ICD-10-CM

## 2014-03-15 LAB — BASIC METABOLIC PANEL
BUN: 18 mg/dL (ref 6–23)
CO2: 23 meq/L (ref 19–32)
Calcium: 9.9 mg/dL (ref 8.4–10.5)
Chloride: 104 mEq/L (ref 96–112)
Creat: 0.64 mg/dL (ref 0.50–1.10)
Glucose, Bld: 68 mg/dL — ABNORMAL LOW (ref 70–99)
Potassium: 4 mEq/L (ref 3.5–5.3)
Sodium: 139 mEq/L (ref 135–145)

## 2014-03-15 LAB — POCT URINALYSIS DIPSTICK
BILIRUBIN UA: NEGATIVE
Blood, UA: NEGATIVE
Glucose, UA: NEGATIVE
Ketones, UA: NEGATIVE
Leukocytes, UA: NEGATIVE
Nitrite, UA: NEGATIVE
Protein, UA: NEGATIVE
Spec Grav, UA: 1.02
Urobilinogen, UA: NEGATIVE
pH, UA: 5

## 2014-03-15 LAB — HEPATIC FUNCTION PANEL
ALBUMIN: 4.6 g/dL (ref 3.5–5.2)
ALT: 17 U/L (ref 0–35)
AST: 26 U/L (ref 0–37)
Alkaline Phosphatase: 65 U/L (ref 39–117)
Bilirubin, Direct: 0.1 mg/dL (ref 0.0–0.3)
Indirect Bilirubin: 0.4 mg/dL (ref 0.2–1.2)
Total Bilirubin: 0.5 mg/dL (ref 0.2–1.2)
Total Protein: 7.3 g/dL (ref 6.0–8.3)

## 2014-03-15 LAB — CBC
HEMATOCRIT: 34.8 % — AB (ref 36.0–46.0)
Hemoglobin: 11.3 g/dL — ABNORMAL LOW (ref 12.0–15.0)
MCH: 31.2 pg (ref 26.0–34.0)
MCHC: 32.5 g/dL (ref 30.0–36.0)
MCV: 96.1 fL (ref 78.0–100.0)
Platelets: 348 10*3/uL (ref 150–400)
RBC: 3.62 MIL/uL — ABNORMAL LOW (ref 3.87–5.11)
RDW: 13.7 % (ref 11.5–15.5)
WBC: 6.5 10*3/uL (ref 4.0–10.5)

## 2014-03-15 LAB — HEMOGLOBIN A1C
Hgb A1c MFr Bld: 5.5 % (ref <5.7)
Mean Plasma Glucose: 111 mg/dL (ref <117)

## 2014-03-15 NOTE — Progress Notes (Signed)
Subjective:   HPI  Nancy Castro is a 63 y.o. female who presents for a complete physical.   Preventative care: Last ophthalmology visit:2014 [address] Last dental visit:2014 [address] Last colonoscopy:2014 [place], polyps, repeat in 3 years Last mammogram:2014 Last gynecological exam: sees gynecology Last EKG:2007 Last labs:2014  Prior vaccinations: TD or Tdap:unsure, does not want Influenza:2014 Pneumococcal:no Shingles/Zostavax:no  Advanced directive: Health care power of attorney:yes Living will:yes  Concerns: Joint pains, worse deviation of right second and third fingers, right knee pain, neck pain all chronic arthritis pain  Reviewed their medical, surgical, family, social, medication, and allergy history and updated chart as appropriate.  Past Medical History  Diagnosis Date  . Hypertension   . DDD (degenerative disc disease), cervical   . Syncope 2012    cardiac stress test   . Osteoarthritis   . Wears glasses   . Wears partial dentures     upper and lower  . H/O mammogram 2013  . Mixed dyslipidemia   . History of exercise stress test 2007  . H/O echocardiogram 2007    Past Surgical History  Procedure Laterality Date  . Partial hysterectomy      fibroids; has both ovaries  . Knee arthroscopy      right  . Lipoma excision      back x 2  . Colonoscopy      never    History   Social History  . Marital Status: Married    Spouse Name: N/A    Number of Children: N/A  . Years of Education: N/A   Occupational History  . Not on file.   Social History Main Topics  . Smoking status: Former Smoker    Quit date: 02/08/1987  . Smokeless tobacco: Never Used  . Alcohol Use: Yes     Comment: "sometime" occ.  . Drug Use: No  . Sexual Activity: Not on file   Other Topics Concern  . 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  No  grandchildren.   Sews - hobby    Family History  Problem Relation Age of Onset  . Cancer Mother     died of lung cancer  . Cancer Father     died of lung cancer, mets to brain  . Hypertension Maternal Grandmother   . Heart disease Neg Hx   . Stroke Neg Hx   . Diabetes Neg Hx   . Colon cancer Neg Hx   . Cancer Sister     lung, mets to brain    Current outpatient prescriptions:aspirin 81 MG tablet, Take 81 mg by mouth daily., Disp: , Rfl: ;  atorvastatin (LIPITOR) 40 MG tablet, Take 1 tablet (40 mg total) by mouth once., Disp: 90 tablet, Rfl: 0;  Cholecalciferol (VITAMIN D3) 400 UNITS CHEW, Chew 2 tablets by mouth daily., Disp: , Rfl: ;  Choline Fenofibrate (TRILIPIX) 135 MG capsule, Take 1 capsule (135 mg total) by mouth daily., Disp: 90 capsule, Rfl: 1 cyclobenzaprine (FLEXERIL) 10 MG tablet, Take 1 tablet (10 mg total) by mouth 2 (two) times daily as needed., Disp: 180 tablet, Rfl: 0;  ibuprofen (ADVIL,MOTRIN) 800 MG tablet, Take 1 tablet (800 mg total) by mouth 2 (two) times daily as needed for pain., Disp: 60 tablet, Rfl: 1;  niacin (NIASPAN) 500 MG CR tablet, Take 1 tablet (500 mg total) by mouth at  bedtime., Disp: 90 tablet, Rfl: 1 olmesartan (BENICAR) 40 MG tablet, Take 1 tablet (40 mg total) by mouth daily., Disp: 90 tablet, Rfl: 3  No Known Allergies     Review of Systems Constitutional: -fever, -chills, -sweats, -unexpected weight change, -decreased appetite, -fatigue Allergy: -sneezing, -itching, -congestion Dermatology: -changing moles, --rash, -lumps ENT: -runny nose, -ear pain, -sore throat, -hoarseness, -sinus pain, -teeth pain, - ringing in ears, -hearing loss, -nosebleeds Cardiology: -chest pain, -palpitations, -swelling, -difficulty breathing when lying flat, -waking up short of breath Respiratory: -cough, -shortness of breath, -difficulty breathing with exercise or exertion, -wheezing, -coughing up blood Gastroenterology: -abdominal pain, -nausea, -vomiting,  -diarrhea, -constipation, -blood in stool, -changes in bowel movement, -difficulty swallowing or eating Hematology: -bleeding, -bruising  Musculoskeletal: +joint aches, -muscle aches,+joint swelling, -back pain, +neck pain, -cramping, -changes in gait Ophthalmology: denies vision changes, eye redness, itching, discharge Urology: -burning with urination, -difficulty urinating, -blood in urine, -urinary frequency, -urgency, -incontinence Neurology: -headache, -weakness, -tingling, -numbness, -memory loss, -falls, -dizziness Psychology: -depressed mood, -agitation, -sleep problems     Objective:   Physical Exam  BP 130/70  Pulse 80  Temp(Src) 98 F (36.7 C) (Oral)  Resp 18  Ht 5\' 1"  (1.549 m)  Wt 143 lb (64.864 kg)  BMI 27.03 kg/m2  General appearance: alert, no distress, WD/WN, AA female Skin: few scattered macules, including bilat orbits, no worrisome lesions HEENT: normocephalic, conjunctiva/corneas normal, sclerae anicteric, PERRLA, EOMi, nares patent, no discharge or erythema, pharynx normal Oral cavity: MMM, tongue normal, teeth -upper dentures Neck: supple, no lymphadenopathy, no thyromegaly, no masses, normal ROM, no bruits Chest: non tender, normal shape and expansion Heart: RRR, normal S1, S2, no murmurs Lungs: CTA bilaterally, no wheezes, rhonchi, or rales Abdomen: +bs, soft, non tender, non distended, no masses, no hepatomegaly, no splenomegaly, no bruits Back: Fatty lump on right lower back consistent with lipoma, surgical scar on back from prior lipoma ,non tender, normal ROM, no scoliosis Musculoskeletal: Right hand second and third MCP with bony arthritic changes, several PIPs with bony arthritic changes, tender of right hand first and second and third digits at the MCP, right knee with bony arthritic changes, mild effusion in general, tender along the joint line,  No laxity, otherwise, upper extremities non tender, no obvious deformity, normal ROM throughout, lower  extremities non tender, no obvious deformity, normal ROM throughout Extremities: no edema, no cyanosis, no clubbing Pulses: 2+ symmetric, upper and lower extremities, normal cap refill Neurological: alert, oriented x 3, CN2-12 intact, strength normal upper extremities and lower extremities, sensation normal throughout, DTRs 2+ throughout, no cerebellar signs, gait normal Psychiatric: normal affect, behavior normal, pleasant  Breast/gyn/rectal - deferred to gynecology   Assessment and Plan :    Encounter Diagnoses  Name Primary?  . Routine general medical examination at a health care facility Yes  . Mixed dyslipidemia   . Essential hypertension, benign   . Osteoarthritis   . Impaired fasting blood sugar   . Vaccine refused by parent   . Joint swelling     Physical exam - discussed healthy lifestyle, diet, exercise, preventative care, vaccinations, and addressed their concerns.  Handout given. Mixed dyslipidemia-lipoprofile labs today Hypertension-continue current medication Osteoarthritis, joint swelling-labs today, reviewed prior x-rays, will call back with recommendations Impaired fasting glucose-labs today She refuses shingles and Tdap vaccine today despite recommendations Follow-up pending labs

## 2014-03-16 ENCOUNTER — Other Ambulatory Visit: Payer: Self-pay | Admitting: Medical

## 2014-03-16 ENCOUNTER — Telehealth: Payer: Self-pay | Admitting: Medical

## 2014-03-16 LAB — NMR LIPOPROFILE WITH LIPIDS
Cholesterol, Total: 219 mg/dL — ABNORMAL HIGH (ref <200)
HDL Particle Number: 42 umol/L (ref >=30.5)
HDL Size: 10.5 nm (ref >=9.2)
HDL-C: 90 mg/dL (ref >=40)
LDL (calc): 119 mg/dL — ABNORMAL HIGH (ref <100)
LDL PARTICLE NUMBER: 1170 nmol/L — AB (ref <1000)
LDL Size: 21.1 nm (ref >20.5)
LP-IR Score: <25 (ref <=45)
Large HDL-P: >20 umol/L (ref >=4.8)
Large VLDL-P: 0.9 nmol/L (ref <=2.7)
Triglycerides: 49 mg/dL (ref <150)
VLDL Size: 49.6 nm — ABNORMAL HIGH (ref <=46.6)

## 2014-03-16 LAB — CYCLIC CITRUL PEPTIDE ANTIBODY, IGG: Cyclic Citrullin Peptide Ab: <2 U/mL (ref 0.0–5.0)

## 2014-03-16 LAB — ANA: Anti Nuclear Antibody(ANA): NEGATIVE

## 2014-03-16 LAB — SEDIMENTATION RATE: Sed Rate: 8 mm/hr (ref 0–22)

## 2014-03-16 MED ORDER — CYCLOBENZAPRINE HCL 10 MG PO TABS: 10.0000 mg | ORAL_TABLET | Freq: Two times a day (BID) | ORAL | Status: DC | PRN

## 2014-03-16 MED ORDER — MELOXICAM 15 MG PO TABS: 15.0000 mg | ORAL_TABLET | Freq: Every day | ORAL | Status: DC

## 2014-03-16 NOTE — Telephone Encounter (Signed)
Is this okay to refill? 

## 2014-03-19 ENCOUNTER — Other Ambulatory Visit: Payer: Self-pay | Admitting: Medical

## 2014-03-19 MED ORDER — ATORVASTATIN CALCIUM 40 MG PO TABS: 40.0000 mg | ORAL_TABLET | Freq: Once | ORAL | Status: DC

## 2014-04-11 NOTE — Telephone Encounter (Signed)
tsd  °

## 2014-05-11 ENCOUNTER — Telehealth: Payer: Self-pay | Admitting: Medical

## 2014-05-11 NOTE — Telephone Encounter (Signed)
Please call patient.  I was looking back over her chart and at the May visit her lipid numbers were not at goal.  I want to let her know about a lipid study.    We work with a company that does research studies and medication trials, called [employer].   They are currently doing a study on a medication for cholesterol.    Participants in the study receive compensation, free medication, close follow up and monitoring.   I wanted to let them know about this study because although she is on Lipitor high dose, the recent cholesterol was NOT at goal.   See if they have any interest in being part of a study.  If agreeable, pass on her info to [employer] and let me know.  If they desire not to be part of the study, that is fine.   We will continue their current medications and/or treatment as usual.

## 2014-05-17 NOTE — Telephone Encounter (Signed)
NO ANSWER, NO VOICEMAIL. CLS

## 2014-05-22 NOTE — Telephone Encounter (Signed)
LMOM TO CB. CLS 

## 2014-05-24 ENCOUNTER — Other Ambulatory Visit: Payer: Self-pay | Admitting: Medical

## 2014-05-24 MED ORDER — ROSUVASTATIN CALCIUM 40 MG PO TABS: 40.0000 mg | ORAL_TABLET | Freq: Every day | ORAL | Status: DC

## 2014-05-24 NOTE — Telephone Encounter (Signed)
Left message for patient to call back, re crestor rx.

## 2014-05-24 NOTE — Telephone Encounter (Signed)
Patient contacted [employer] and she is not eligible.

## 2014-05-24 NOTE — Telephone Encounter (Signed)
Ok, in that case, lets change to crestor as it works the best.  Plan to recheck lipids in 56mo.  Can give her coupon card.

## 2014-05-24 NOTE — Telephone Encounter (Signed)
Reported to patient. She will start Crestor instead of Lipitor and will call back for labwork appointment.

## 2014-10-17 ENCOUNTER — Encounter: Payer: 59 | Admitting: Medical

## 2014-10-24 ENCOUNTER — Other Ambulatory Visit: Payer: Self-pay | Admitting: Family Medicine

## 2014-10-24 ENCOUNTER — Encounter: Payer: Self-pay | Admitting: Medical

## 2014-10-24 ENCOUNTER — Telehealth: Payer: Self-pay | Admitting: Medical

## 2014-10-24 ENCOUNTER — Ambulatory Visit (INDEPENDENT_AMBULATORY_CARE_PROVIDER_SITE_OTHER): Payer: 59 | Admitting: Medical

## 2014-10-24 VITALS — BP 120/80 | HR 98 | Temp 98.1°F | Resp 14 | Wt 135.0 lb

## 2014-10-24 DIAGNOSIS — I10 Essential (primary) hypertension: Secondary | ICD-10-CM

## 2014-10-24 DIAGNOSIS — E785 Hyperlipidemia, unspecified: Secondary | ICD-10-CM

## 2014-10-24 DIAGNOSIS — R413 Other amnesia: Secondary | ICD-10-CM

## 2014-10-24 DIAGNOSIS — Z79899 Other long term (current) drug therapy: Secondary | ICD-10-CM

## 2014-10-24 DIAGNOSIS — D6489 Other specified anemias: Secondary | ICD-10-CM

## 2014-10-24 LAB — COMPREHENSIVE METABOLIC PANEL
ALT: 18 U/L (ref 0–35)
AST: 25 U/L (ref 0–37)
Albumin: 4.5 g/dL (ref 3.5–5.2)
Alkaline Phosphatase: 62 U/L (ref 39–117)
BILIRUBIN TOTAL: 0.3 mg/dL (ref 0.2–1.2)
BUN: 13 mg/dL (ref 6–23)
CALCIUM: 9.3 mg/dL (ref 8.4–10.5)
CO2: 25 mEq/L (ref 19–32)
Chloride: 105 mEq/L (ref 96–112)
Creat: 0.63 mg/dL (ref 0.50–1.10)
Glucose, Bld: 85 mg/dL (ref 70–99)
Potassium: 3.9 mEq/L (ref 3.5–5.3)
Sodium: 144 mEq/L (ref 135–145)
Total Protein: 7 g/dL (ref 6.0–8.3)

## 2014-10-24 NOTE — Telephone Encounter (Signed)
Chandra-please help me figure out what happened here.  I restarted her Lipitor in May, but for some reason Crestor was sent out in July and I can't see any correspondence to suggest why we have sent Crestor. I don't recall telling her to use Crestor, don't recall switching from Lipitor to Crestor but somehow she ended up getting Crestor in July and has been a stay doubling up taking Lipitor and Crestor.

## 2014-10-24 NOTE — Telephone Encounter (Signed)
I put the orders in EPIC for [facility] and they will contact the patient by phone for her appointment.

## 2014-10-24 NOTE — Telephone Encounter (Signed)
Refer to neurology [facility] ) for memory changes, concern from daughter, abnormal MMSE exam.

## 2014-10-24 NOTE — Progress Notes (Signed)
Subjective:   Nancy Castro is a 63 y.o. female presenting on 10/24/2014 with fasting medication check  She is accompanied by her daughter today from [place] who has questions about her memory and age.  She has hx/o HTN, dyslipidemia.   Hypertension-taking Benicar 40 mg daily. medicaion is somewhat expensive, but works well for her.  Dyslipidemia-per daughter and her report, she is taking Crestor 40 mg and Lipitor 40mg  daily, and niacin 500 mg daily, Aspirin daily.  At her last visit we had advise she start Lipitor and niacin but apparently she is still taking Crestor as well. Last visit in May after we did an NMR LipoProfile showing moderately elevated LDL particle numbers.    Last labs in may showed mild anemia.  Had colonoscopy last year.    Diet - eats healthy Exercise - does some walking  Her daughter's main concern today is her memory over the last several months she seems to forget things are sometimes can't remember certain things. She lives alone although there is some family close by including an aunt that stops by and sees her somewhat regularly.  There hasn't been any major concerns such as forgetting the stove being left on or bath water running or being lost in her car.  Minor but somewhat frequent small things.  She had has had a history of syncope between 2007 and 2012 that never was determined to be cardiac but she never saw neurology at the time.  She hasn't had any of these episodes since at least 2013.  No history of seizures. No other aggravating or relieving factors.  No other complaint.  Review of Systems ROS as in subjective      Objective:   BP 120/80 mmHg  Pulse 98  Temp(Src) 98.1 F (36.7 C) (Oral)  Resp 14  Wt 135 lb (61.236 kg)  Wt Readings from Last 3 Encounters:  10/24/14 135 lb (61.236 kg)  03/15/14 143 lb (64.864 kg)  03/29/13 150 lb (68.04 kg)    General appearance: alert, no distress, WD/WN HEENT: normocephalic, sclerae anicteric, TMs  pearly, nares patent, no discharge or erythema, pharynx normal Oral cavity: MMM, no lesions Neck: supple, no lymphadenopathy, no thyromegaly, no masses, no bruits Heart: RRR, normal S1, S2, no murmurs Lungs: CTA bilaterally, no wheezes, rhonchi, or rales Abdomen: +bs, soft, non tender, non distended, no masses, no hepatomegaly, no splenomegaly Pulses: 2+ symmetric, upper and lower extremities, normal cap refill Neuro: PERRLA, EOMI, CN II through XII intact, normal strength and sensation normal DTRs      Assessment: Encounter Diagnoses  Name Primary?  . Dyslipidemia Yes  . Essential hypertension   . Memory change   . High risk medication use   . 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 She will finish out the Crestor she has then will switch back to Lipitor.  She will continue niacin, daily aspirin, plan to recheck lipid panel in 3-4 months. CMET and liver test today Hypertension-for now continue Benicar 40 mg but consider changing in January to a cheaper option memory change-no mini status exam today with score of 26/30.  Normal clock face drawing.  Part of her recent symptoms could be related to mental fog from statin use and she was taking 2 statins at the same time. Nevertheless, refer to neurology for evaluation High risk medication-labs today Anemia-labs today  Nancy Castro was seen today for fasting medication check.  Diagnoses and associated orders for this visit:  Dyslipidemia - Comprehensive metabolic panel - CBC with Differential - Vitamin B12 - Folate - TSH  Essential hypertension - Comprehensive metabolic panel - CBC with Differential - Vitamin B12 - Folate - TSH  Memory change - Comprehensive metabolic panel - CBC with Differential - Vitamin B12 - Folate - TSH  High risk medication use - Comprehensive metabolic panel - CBC with Differential - Vitamin B12 - Folate - TSH  Anemia due to  other cause - Comprehensive metabolic panel - CBC with Differential - Vitamin B12 - Folate - TSH   Return pending labs.

## 2014-10-25 LAB — CBC WITH DIFFERENTIAL/PLATELET
Basophils Absolute: 0 10*3/uL (ref 0.0–0.1)
Basophils Relative: 1 % (ref 0–1)
Eosinophils Absolute: 0 10*3/uL (ref 0.0–0.7)
Eosinophils Relative: 0 % (ref 0–5)
HCT: 39 % (ref 36.0–46.0)
Hemoglobin: 12.5 g/dL (ref 12.0–15.0)
LYMPHS PCT: 52 % — AB (ref 12–46)
Lymphs Abs: 1.8 10*3/uL (ref 0.7–4.0)
MCH: 32.6 pg (ref 26.0–34.0)
MCHC: 32.1 g/dL (ref 30.0–36.0)
MCV: 101.8 fL — AB (ref 78.0–100.0)
MONO ABS: 0.2 10*3/uL (ref 0.1–1.0)
MPV: 10.7 fL (ref 9.4–12.4)
Monocytes Relative: 5 % (ref 3–12)
NEUTROS PCT: 42 % — AB (ref 43–77)
Neutro Abs: 1.5 10*3/uL — ABNORMAL LOW (ref 1.7–7.7)
Platelets: 337 10*3/uL (ref 150–400)
RBC: 3.83 MIL/uL — ABNORMAL LOW (ref 3.87–5.11)
RDW: 14.6 % (ref 11.5–15.5)
WBC: 3.5 10*3/uL — AB (ref 4.0–10.5)

## 2014-10-25 LAB — FOLATE: Folate: 4 ng/mL

## 2014-10-25 LAB — VITAMIN B12: Vitamin B-12: 500 pg/mL (ref 211–911)

## 2014-10-25 LAB — TSH: TSH: 1.1 u[IU]/mL (ref 0.350–4.500)

## 2014-10-29 ENCOUNTER — Telehealth: Payer: Self-pay | Admitting: Medical

## 2014-10-29 NOTE — Telephone Encounter (Signed)
I look through the patients chart and I'm not sure where the mix up happen. I  did speak to her daughter to make sure all medications was correct as of today. The states that everything is correct.

## 2014-10-29 NOTE — Telephone Encounter (Signed)
Pt called requesting refill on Benicar 40MG  to [facility] @ [facility]. Also rcvd refill request for Cyclobenzaprine, Niacin ER, Crestor, Atorvastatin, Meloxicam @ [facility]

## 2014-10-30 ENCOUNTER — Telehealth: Payer: Self-pay | Admitting: Family Medicine

## 2014-10-30 ENCOUNTER — Other Ambulatory Visit: Payer: Self-pay | Admitting: Medical

## 2014-10-30 MED ORDER — OLMESARTAN MEDOXOMIL 40 MG PO TABS: 40.0000 mg | ORAL_TABLET | Freq: Every day | ORAL | Status: DC

## 2014-10-30 MED ORDER — NIACIN ER (ANTIHYPERLIPIDEMIC) 500 MG PO TBCR: 500.0000 mg | EXTENDED_RELEASE_TABLET | Freq: Every day | ORAL | Status: DC

## 2014-10-30 NOTE — Telephone Encounter (Signed)
  Patient states that she takes Flexeril 2 x a day and she takes Melocicam 1 x a day.  Patient is aware of the message from Dorothea Ogle PA         Patient is requesting refills on her Flexeril and Meloxicam.

## 2014-10-30 NOTE — Telephone Encounter (Signed)
How often is she taking Flexeril?  How often is she using Meloxicam?  As we just discussed at recent visit, she has plenty of Crestor and Lipitor left.  She will finish out Crestor then switch to Lipitor.  I sent the Niacin and Benicar.

## 2014-10-30 NOTE — Telephone Encounter (Signed)
Patient states that she takes Flexeril 2 x a day and she takes Melocicam 1 x a day.  Patient is aware of the message from [facility] PA

## 2014-11-01 ENCOUNTER — Telehealth: Payer: Self-pay | Admitting: Internal Medicine

## 2014-11-01 ENCOUNTER — Other Ambulatory Visit: Payer: Self-pay | Admitting: Medical

## 2014-11-01 MED ORDER — CYCLOBENZAPRINE HCL 10 MG PO TABS: 10.0000 mg | ORAL_TABLET | Freq: Two times a day (BID) | ORAL | Status: DC | PRN

## 2014-11-01 NOTE — Telephone Encounter (Signed)
She states she has arthritis everywhere and needs something for pain daily. She states she was on flexeril when she cam to see you and then you provided her with Meloxicam. I told her we would advise her on what you would like her to try.

## 2014-11-01 NOTE — Telephone Encounter (Signed)
Now that we recently discussed her memory and overall medications, I don't like the fact she is taking Meloxicam every day and flexeril BID.  Flexeril is sedating, and meloxicam raises risk of GI bleeds and kidney damage.    I assume she still takes these regularly for neck pains?   Does she feel like she needs something for pain every day?    Lets come up with a different plan.

## 2014-11-01 NOTE — Telephone Encounter (Signed)
Refill request for crestor and atorvastatin to [facility]

## 2014-11-01 NOTE — Telephone Encounter (Signed)
Tried to call patient, phone was busy

## 2014-11-01 NOTE — Telephone Encounter (Signed)
No.  I don't understand . When she was here recently with her daughter, she had several Crestor tablets left and was taking this and Lipitor simultaneously.   I told her to STOP Lipitor for the time being so she wouldn't be doubling up, finish Crestor, then go back to Lipitor.   My understanding is that she has both tablets in plenty of quantity at home NOW.    Of note, when she was here, there was concern for her memory.   If needed, call her daughter and verify or clarify any discrepancy.  If needed I'll send Lipitor.

## 2014-11-01 NOTE — Telephone Encounter (Signed)
Nancy Castro ok to RF both crestor and atorvastatin?

## 2014-11-01 NOTE — Telephone Encounter (Signed)
Spoke with patient, she apparently did get Lipitor filled at the pharmacy recently and does not need it sent to mail order. She does have Crestor and Lipitor prescriptions at home. I advised her NOT to take both at the same time and she says she has not and will not. However she is planning on using up the Crestor she has and then going to Lipitor.

## 2014-11-01 NOTE — Telephone Encounter (Signed)
Ok, lets c/t same medications for now, plan recheck in 25mo, and lets discuss her pains further at that time.  This past visit we spent the bulk of the time on other issues, so we really didn't address pain and those medications like we need to

## 2014-11-05 NOTE — Telephone Encounter (Signed)
Patient is aware of Nancy Castro pAc message in detail

## 2014-11-08 ENCOUNTER — Other Ambulatory Visit: Payer: Self-pay | Admitting: Medical

## 2014-11-08 ENCOUNTER — Telehealth: Payer: Self-pay | Admitting: Family Medicine

## 2014-11-08 ENCOUNTER — Other Ambulatory Visit: Payer: Self-pay

## 2014-11-08 MED ORDER — ATORVASTATIN CALCIUM 40 MG PO TABS: 40.0000 mg | ORAL_TABLET | Freq: Every day | ORAL | Status: DC

## 2014-11-08 NOTE — Telephone Encounter (Signed)
Pt called and wanted refill of Atorvastatin sent to [facility] on [address]  Pt ph [phone]

## 2014-11-08 NOTE — Telephone Encounter (Signed)
OK to RF

## 2014-11-08 NOTE — Telephone Encounter (Signed)
pls call it out 

## 2014-11-08 NOTE — Telephone Encounter (Signed)
Called generic lipitor 40mg  1 po qd #90 0 rf to [address], patient aware, she did not want it sent to [place].

## 2014-12-03 ENCOUNTER — Ambulatory Visit: Payer: BC Managed Care – PPO | Admitting: Neurology

## 2014-12-03 ENCOUNTER — Ambulatory Visit (INDEPENDENT_AMBULATORY_CARE_PROVIDER_SITE_OTHER): Payer: 59 | Admitting: Neurology

## 2014-12-03 ENCOUNTER — Encounter: Payer: Self-pay | Admitting: Neurology

## 2014-12-03 VITALS — BP 130/76 | HR 91 | Resp 16 | Ht 61.0 in | Wt 140.0 lb

## 2014-12-03 DIAGNOSIS — I10 Essential (primary) hypertension: Secondary | ICD-10-CM

## 2014-12-03 DIAGNOSIS — E785 Hyperlipidemia, unspecified: Secondary | ICD-10-CM

## 2014-12-03 DIAGNOSIS — R413 Other amnesia: Secondary | ICD-10-CM

## 2014-12-03 NOTE — Patient Instructions (Signed)
1. Schedule MRI brain without contrast 2. Physical exercise and brain stimulation exercises are important for brain health 3. Continue control of BP and cholesterol with your PCP 4. Continue to monitor mood, as this may affect memory as well 5. Follow-up in 6 months

## 2014-12-03 NOTE — Progress Notes (Signed)
NEUROLOGY CONSULTATION NOTE  Nancy Castro MRN: [id number] DOB: [birth year]  Referring provider: Chana Bode, PA-C Primary care provider: Chana Bode, PA-C  Reason for consult:  Memory loss  Thank you for your kind referral of Nancy Castro for consultation of the above symptoms. Although her history is well known to you, please allow me to reiterate it for the purpose of our medical record. The patient was accompanied to the clinic by her daughter who also provides collateral information. Records and images were personally reviewed where available.  HISTORY OF PRESENT ILLNESS: This is a 64 year old right-handed woman with a history of hypertension, syncope, hyperlipidemia, presenting for evaluation of memory loss. She feels her memory is "all right." She might repeat herself, which she feels is normal for her age. Her daughter has been concerned about this, because she [place]. She states there are days when she is "spot on," but other days when she calls and the next day her mother does not recall their conversation. This has been ongoing for the past year. She reports that her mother separated from her father earlier in the year. She denies any personality changes in her mother. The patient denies missing her medications, she denies getting lost driving, no missed bill payments. She lives by herself and performs ADLs independently. There is no family history of memory loss.  She denies any headaches, dizziness, diplopia, dysarthria, dysphagia, focal numbness/tingling/weakness, bowel/bladder dysfunction. No tremors or anosmia. She has occasional neck pain. She denies any significant head injuries. They report syncopal episodes in the past with unremarkable cardiac workup, last episode was in 2012. No prior warning symptoms, she would wake up on the ground. She denies any episodes of staring/unresponsiveness, rising epigastric sensation, olfactory/gustatory  hallucinations, myoclonic jerks.   Laboratory Data: Lab Results  Component Value Date   WBC 3.5* 10/24/2014   HGB 12.5 10/24/2014   HCT 39.0 10/24/2014   MCV 101.8* 10/24/2014   PLT 337 10/24/2014     Chemistry      Component Value Date/Time   NA 144 10/24/2014 1050   K 3.9 10/24/2014 1050   CL 105 10/24/2014 1050   CO2 25 10/24/2014 1050   BUN 13 10/24/2014 1050   CREATININE 0.63 10/24/2014 1050      Component Value Date/Time   CALCIUM 9.3 10/24/2014 1050   ALKPHOS 62 10/24/2014 1050   AST 25 10/24/2014 1050   ALT 18 10/24/2014 1050   BILITOT 0.3 10/24/2014 1050     Lab Results  Component Value Date   TSH 1.100 10/24/2014   Lab Results  Component Value Date   VITAMINB12 500 10/24/2014     PAST MEDICAL HISTORY: Past Medical History  Diagnosis Date  . Hypertension   . DDD (degenerative disc disease), cervical   . Syncope 2012    cardiac stress test   . Osteoarthritis   . Wears glasses   . Wears partial dentures     upper and lower  . H/O mammogram 2013  . Mixed dyslipidemia   . History of exercise stress test 2007  . H/O echocardiogram 2007    PAST SURGICAL HISTORY: Past Surgical History  Procedure Laterality Date  . Partial hysterectomy      fibroids; has both ovaries  . Knee arthroscopy      right  . Lipoma excision      back x 2  . Colonoscopy      2014, polyps, repeat 3 years  MEDICATIONS: Current Outpatient Prescriptions on File Prior to Visit  Medication Sig Dispense Refill  . aspirin 81 MG tablet Take 81 mg by mouth daily.    . Cholecalciferol (VITAMIN D3) 400 UNITS CHEW Chew 2 tablets by mouth daily.    . cyclobenzaprine (FLEXERIL) 10 MG tablet Take 1 tablet (10 mg total) by mouth 2 (two) times daily as needed. 180 tablet 0  . ibuprofen (ADVIL,MOTRIN) 800 MG tablet Take 1 tablet (800 mg total) by mouth 2 (two) times daily as needed for pain. 60 tablet 1  . meloxicam (MOBIC) 15 MG tablet Take 1 tablet (15 mg total) by mouth daily. 30  tablet 1  . niacin (NIASPAN) 500 MG CR tablet Take 1 tablet (500 mg total) by mouth at bedtime. 90 tablet 1  . olmesartan (BENICAR) 40 MG tablet Take 1 tablet (40 mg total) by mouth daily. 90 tablet 0  . atorvastatin (LIPITOR) 40 MG tablet Take 1 tablet (40 mg total) by mouth daily. (Patient not taking: Reported on 12/03/2014) 90 tablet 0   No current facility-administered medications on file prior to visit.    ALLERGIES: No Known Allergies  FAMILY HISTORY: Family History  Problem Relation Age of Onset  . Cancer Mother     died of lung cancer  . Cancer Father     died of lung cancer, mets to brain  . Hypertension Maternal Grandmother   . Heart disease Neg Hx   . Stroke Neg Hx   . Diabetes Neg Hx   . Colon cancer Neg Hx   . Cancer Sister     lung, mets to brain    SOCIAL HISTORY: History   Social History  . Marital Status: Married    Spouse Name: N/A    Number of Children: N/A  . Years of Education: N/A   Occupational History  . Not on file.   Social History Main Topics  . Smoking status: Former Smoker    Quit date: 02/08/1987  . Smokeless tobacco: Never Used  . Alcohol Use: No  . Drug Use: No  . Sexual Activity: Not on file   Other Topics Concern  . 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  No grandchildren.   Sews - hobby    REVIEW OF SYSTEMS: Constitutional: No fevers, chills, or sweats, no generalized fatigue, change in appetite Eyes: No visual changes, double vision, eye pain Ear, nose and throat: No hearing loss, ear pain, nasal congestion, sore throat Cardiovascular: No chest pain, palpitations Respiratory:  No shortness of breath at rest or with exertion, wheezes GastrointestinaI: No nausea, vomiting, diarrhea, abdominal pain, fecal incontinence Genitourinary:  No dysuria, urinary retention or frequency Musculoskeletal:  + neck pain, no  back pain Integumentary: No rash, pruritus, skin lesions Neurological: as above Psychiatric: No depression, insomnia, anxiety Endocrine: No palpitations, fatigue, diaphoresis, mood swings, change in appetite, change in weight, increased thirst Hematologic/Lymphatic:  No anemia, purpura, petechiae. Allergic/Immunologic: no itchy/runny eyes, nasal congestion, recent allergic reactions, rashes  PHYSICAL EXAM: Filed Vitals:   12/03/14 0851  BP: 130/76  Pulse: 91  Resp: 16   General: No acute distress Head:  Normocephalic/atraumatic Eyes: Fundoscopic exam shows bilateral sharp discs, no vessel changes, exudates, or hemorrhages Neck: supple, no paraspinal tenderness, full range of motion Back: No paraspinal tenderness Heart: regular rate and rhythm Lungs: Clear to auscultation bilaterally. Vascular: No carotid  bruits. Skin/Extremities: No rash, no edema Neurological Exam: Mental status: alert and oriented to person, place, and time, no dysarthria or aphasia, Fund of knowledge is appropriate.  Remote memory intact.  Attention and concentration are normal.    Able to name objects and repeat phrases.  Montreal Cognitive Assessment  12/03/2014  Visuospatial/ Executive (0/5) 4  Naming (0/3) 3  Attention: Read list of digits (0/2) 1  Attention: Read list of letters (0/1) 1  Attention: Serial 7 subtraction starting at 100 (0/3) 2  Language: Repeat phrase (0/2) 2  Language : Fluency (0/1) 0  Abstraction (0/2) 2  Delayed Recall (0/5) 3  Orientation (0/6) 6  Total 24  Adjusted Score (based on education) 24   Cranial nerves: CN I: not tested CN II: pupils equal, round and reactive to light, visual fields intact, fundi unremarkable. CN III, IV, VI:  full range of motion, no nystagmus, no ptosis CN V: facial sensation intact CN VII: upper and lower face symmetric CN VIII: hearing intact to finger rub CN IX, X: gag intact, uvula midline CN XI: sternocleidomastoid and trapezius muscles  intact CN XII: tongue midline Bulk & Tone: normal, no fasciculations. Motor: 5/5 throughout with no pronator drift. Sensation: intact to light touch, cold, pin, vibration and joint position sense.  No extinction to double simultaneous stimulation.  Romberg test negative Deep Tendon Reflexes: +2 throughout, no ankle clonus Plantar responses: downgoing bilaterally Cerebellar: no incoordination on finger to nose, heel to shin. No dysdiadochokinesia Gait: narrow-based and steady, able to tandem walk adequately. Tremor: none  IMPRESSION: This is a 64 year old right-handed woman with vascular risk factors including hypertension and hyperlipidemia, presenting for evaluation of memory loss. Her daughter has noticed short-term memory changes which the patient denies. Her MOCA score today is 24/30 (normal >26/30), indicating mild cognitive impairment. Otherwise non-focal exam. We discussed different causes of memory changes. TSH and B12 normal. MRI brain without contrast will be ordered to assess for underlying structural abnormality and vascular load. We discussed pseudodementia and effects of mood on memory, her daughter did endorse stress since her recent separation from her husband. We discussed the importance of control of vascular risk factors, physical exercise and brain stimulation exercises for brain health. We discussed the option for starting low dose cholinesterase inhibitors such as Aricept, side effects and expectations from the medication, she would like to hold off for now. She will follow-up in 6 months.   Thank you for allowing me to participate in the care of this patient. Please do not hesitate to call for any questions or concerns.   Ellouise Newer, M.D.

## 2014-12-04 ENCOUNTER — Encounter: Payer: Self-pay | Admitting: Neurology

## 2014-12-17 ENCOUNTER — Telehealth: Payer: Self-pay | Admitting: Family Medicine

## 2014-12-17 NOTE — Telephone Encounter (Signed)
Patient called. She wanted to let you know that she had to cancel MRI due to expense. Her out of pocket would have been $2000.

## 2014-12-18 ENCOUNTER — Other Ambulatory Visit: Payer: Self-pay | Admitting: Family Medicine

## 2014-12-18 ENCOUNTER — Telehealth: Payer: Self-pay | Admitting: Medical

## 2014-12-18 ENCOUNTER — Ambulatory Visit (HOSPITAL_COMMUNITY)
Admission: RE | Admit: 2014-12-18 | Discharge: 2014-12-18 | Disposition: A | Discharge status: Home / Self Care | Payer: 59 | Source: Ambulatory Visit | Attending: Neurology | Admitting: Neurology

## 2014-12-18 DIAGNOSIS — R413 Other amnesia: Secondary | ICD-10-CM | POA: Diagnosis present

## 2014-12-18 DIAGNOSIS — R930 Abnormal findings on diagnostic imaging of skull and head, not elsewhere classified: Secondary | ICD-10-CM | POA: Diagnosis not present

## 2014-12-18 DIAGNOSIS — M479 Spondylosis, unspecified: Secondary | ICD-10-CM | POA: Insufficient documentation

## 2014-12-18 MED ORDER — NIACIN ER (ANTIHYPERLIPIDEMIC) 500 MG PO TBCR: 500.0000 mg | EXTENDED_RELEASE_TABLET | Freq: Every day | ORAL | Status: DC

## 2014-12-18 MED ORDER — ATORVASTATIN CALCIUM 40 MG PO TABS: 40.0000 mg | ORAL_TABLET | Freq: Every day | ORAL | Status: DC

## 2014-12-18 NOTE — Telephone Encounter (Signed)
Pt stopped by and requested refills on lipitor and niacin sent to [facility]. Pt can be reached at [phone]

## 2014-12-18 NOTE — Telephone Encounter (Signed)
Lmom to return my call. 

## 2014-12-18 NOTE — Telephone Encounter (Signed)
Can she do head CT without contrast? If not, we can discuss on follow-up visit. Thanks

## 2014-12-18 NOTE — Telephone Encounter (Signed)
Medications was sent to her pharmacy

## 2014-12-20 ENCOUNTER — Telehealth: Payer: Self-pay | Admitting: Family Medicine

## 2014-12-20 NOTE — Telephone Encounter (Signed)
Lmovm to return my call. 

## 2014-12-20 NOTE — Telephone Encounter (Signed)
-----   Message from Cameron Sprang, MD sent at 12/20/2014 12:52 PM EST ----- Pls let her know I reviewed MRI brain, it is normal, no evidence of tumor, stroke, or bleed. Thanks

## 2014-12-24 NOTE — Telephone Encounter (Signed)
Left another msg for patient to return my call. Need to speak with her about MRI results.

## 2014-12-27 ENCOUNTER — Encounter: Payer: Self-pay | Admitting: Family Medicine

## 2014-12-27 NOTE — Telephone Encounter (Signed)
Result letter mailed to patient after not being able to reach by phone.

## 2014-12-31 ENCOUNTER — Telehealth: Payer: Self-pay | Admitting: Family Medicine

## 2014-12-31 MED ORDER — ATORVASTATIN CALCIUM 40 MG PO TABS: 40.0000 mg | ORAL_TABLET | Freq: Every day | ORAL | Status: DC

## 2014-12-31 NOTE — Telephone Encounter (Signed)
Pt was told after finish her Crestor for her to start her Lipitor.  It looks like the Lipitor was ordered and then cancelled.  Patient needs Lipitor, not sure why cancelled.  Please send to [facility]

## 2014-12-31 NOTE — Telephone Encounter (Signed)
done

## 2015-01-02 ENCOUNTER — Other Ambulatory Visit: Payer: Self-pay | Admitting: Medical

## 2015-01-02 ENCOUNTER — Telehealth: Payer: Self-pay | Admitting: Family Medicine

## 2015-01-02 MED ORDER — NIACIN ER (ANTIHYPERLIPIDEMIC) 500 MG PO TBCR: 500.0000 mg | EXTENDED_RELEASE_TABLET | Freq: Every day | ORAL | Status: DC

## 2015-01-02 NOTE — Telephone Encounter (Signed)
I sent different form of Niacin to pharmacy.  F/u was advised 2-3 mo, so will be due end of March fasting.

## 2015-01-02 NOTE — Telephone Encounter (Signed)
Pt called and she went to pick up her Niacin and she cannot afford it.  Please call in generic to [facility] on [address].

## 2015-01-02 NOTE — Telephone Encounter (Signed)
Due for f/u anyhow, so have her come in fasting.

## 2015-01-02 NOTE — Telephone Encounter (Signed)
Patient states that she was just in here on 10/25/15. Your last OV note plan said to follow up here fasting in 3 to 4 months so please advise about her medication.

## 2015-01-03 NOTE — Telephone Encounter (Signed)
Reported and scheduled appointment 01/30/15

## 2015-01-07 NOTE — Telephone Encounter (Signed)
Pt states that the niacin/niaspan is still over $100 and she can't afford that. Pt needs something else sent in and then also needs lipitor refilled. Send to [facility]

## 2015-01-07 NOTE — Telephone Encounter (Signed)
pls call pharmacy to see what generic form is available or what they recommend.   I am only seeing XR versions in Epic and warnings about not using regular release dosing

## 2015-01-08 NOTE — Telephone Encounter (Signed)
I spoke with the pharmacist who said that the Niacin is the generic to the Niaspan. He said he doesn't know of anything else except they have a OTC Niacin controlled release she could try.

## 2015-01-09 ENCOUNTER — Other Ambulatory Visit: Payer: Self-pay | Admitting: Medical

## 2015-01-09 MED ORDER — NIACIN 250 MG PO TABS: ORAL_TABLET | ORAL | Status: DC

## 2015-01-09 NOTE — Telephone Encounter (Signed)
If possible, I would try and call her daughter who came in last visit.  There was already a concern about memory loss, and although she has seen neurology, this phone call would suggests that maybe she is forgetting things.   I do recommend a follow up visit

## 2015-01-09 NOTE — Telephone Encounter (Signed)
Since insurance is giving [place] a road block I sent regular release Niacin, take 2 tablets BID.   F/u in 6wk fasting.

## 2015-01-09 NOTE — Telephone Encounter (Signed)
I called and I spoke with the patients Daughter Nancy Castro about the last 2 conversations that I had with her regarding her medications . The daughter states that she will speak with her mom and go over medications with her over the phone. I told the daughter that she can call me back if she needs to. She states that they had a death in the family and she may be a little confused from that. She said she appreciates [place] calling her.

## 2015-01-09 NOTE — Telephone Encounter (Signed)
I went over the message from [facility] and I told her sent another Rx of Niacin to her pharmacy.

## 2015-01-09 NOTE — Telephone Encounter (Signed)
Patient proceeds to ask me about Lipitor Rx and it hasn't been ordered yet. I look in the computer and it was ordered on 12/31/14 # 33. I called over to [facility] and spoke with the pharmacy tech. Who states that Rx was there. It was pick up and paid for on 01/01/15 for # 90. I called the patient back to let her know what the pharmacy said. She states that she doesn't have a Rx for Niacin I explain to her that I'm talking about her Niacin but I'm speaking about her Lipitor  that she states was not sent to the pharmacy. She states that she doesn't have any Lipitor.  Please, advise on what to do next

## 2015-01-30 ENCOUNTER — Ambulatory Visit (INDEPENDENT_AMBULATORY_CARE_PROVIDER_SITE_OTHER): Payer: 59 | Admitting: Medical

## 2015-01-30 ENCOUNTER — Encounter: Payer: Self-pay | Admitting: Medical

## 2015-01-30 VITALS — BP 130/82 | HR 76 | Temp 97.9°F | Resp 14 | Wt 143.0 lb

## 2015-01-30 DIAGNOSIS — I10 Essential (primary) hypertension: Secondary | ICD-10-CM | POA: Diagnosis not present

## 2015-01-30 DIAGNOSIS — E782 Mixed hyperlipidemia: Secondary | ICD-10-CM | POA: Diagnosis not present

## 2015-01-30 DIAGNOSIS — G3184 Mild cognitive impairment, so stated: Secondary | ICD-10-CM | POA: Diagnosis not present

## 2015-01-30 DIAGNOSIS — M159 Polyosteoarthritis, unspecified: Secondary | ICD-10-CM

## 2015-01-30 DIAGNOSIS — M8949 Other hypertrophic osteoarthropathy, multiple sites: Secondary | ICD-10-CM

## 2015-01-30 DIAGNOSIS — D6489 Other specified anemias: Secondary | ICD-10-CM | POA: Diagnosis not present

## 2015-01-30 DIAGNOSIS — M150 Primary generalized (osteo)arthritis: Secondary | ICD-10-CM

## 2015-01-30 LAB — COMPREHENSIVE METABOLIC PANEL
ALBUMIN: 4.4 g/dL (ref 3.5–5.2)
ALT: 19 U/L (ref 0–35)
AST: 17 U/L (ref 0–37)
Alkaline Phosphatase: 71 U/L (ref 39–117)
BILIRUBIN TOTAL: 0.5 mg/dL (ref 0.2–1.2)
BUN: 10 mg/dL (ref 6–23)
CO2: 27 mEq/L (ref 19–32)
Calcium: 10.1 mg/dL (ref 8.4–10.5)
Chloride: 106 mEq/L (ref 96–112)
Creat: 0.58 mg/dL (ref 0.50–1.10)
Glucose, Bld: 96 mg/dL (ref 70–99)
POTASSIUM: 4.3 meq/L (ref 3.5–5.3)
Sodium: 142 mEq/L (ref 135–145)
Total Protein: 6.9 g/dL (ref 6.0–8.3)

## 2015-01-30 LAB — CBC
HCT: 35.9 % — ABNORMAL LOW (ref 36.0–46.0)
HEMOGLOBIN: 12 g/dL (ref 12.0–15.0)
MCH: 31.7 pg (ref 26.0–34.0)
MCHC: 33.4 g/dL (ref 30.0–36.0)
MCV: 95 fL (ref 78.0–100.0)
MPV: 10.4 fL (ref 8.6–12.4)
Platelets: 284 10*3/uL (ref 150–400)
RBC: 3.78 MIL/uL — ABNORMAL LOW (ref 3.87–5.11)
RDW: 13.7 % (ref 11.5–15.5)
WBC: 3.6 10*3/uL — AB (ref 4.0–10.5)

## 2015-01-30 MED ORDER — CYCLOBENZAPRINE HCL 5 MG PO TABS: 5.0000 mg | ORAL_TABLET | Freq: Every evening | ORAL | Status: DC | PRN

## 2015-01-30 MED ORDER — OLMESARTAN MEDOXOMIL 40 MG PO TABS: 40.0000 mg | ORAL_TABLET | Freq: Every day | ORAL | Status: DC

## 2015-01-30 MED ORDER — MELOXICAM 15 MG PO TABS: 15.0000 mg | ORAL_TABLET | Freq: Every day | ORAL | Status: DC

## 2015-01-30 NOTE — Progress Notes (Signed)
   Subjective:   Nancy Castro is a 64 y.o. female presenting on 01/30/2015 with fasting medication check and Medication Refill  She has hx/o HTN, dyslipidemia.   Hypertension-taking Benicar 40 mg daily without c/o.  Dyslipidemia-taking Lipitor daily and OTC niacin BID without c/o.  Finished all the crestor she had stockpiled from last visit   Last labs in may showed mild anemia.  Had colonoscopy last year.    Diet - eats healthy Exercise - does some walking  Needs refill on arthritis pill.  Had arthritis of fingers, hands, right knee.    Review of Systems ROS as in subjective      Objective:   BP 130/82 mmHg  Pulse 76  Temp(Src) 97.9 F (36.6 C) (Oral)  Resp 14  Wt 143 lb (64.864 kg)  General appearance: alert, no distress, WD/WN Neck: supple, no lymphadenopathy, no thyromegaly, no masses, no bruits Heart: RRR, normal S1, S2, no murmurs Lungs: CTA bilaterally, no wheezes, rhonchi, or rales Pulses: 2+ symmetric, upper and lower extremities, normal cap refill Ext: no edema MSK: bony arthrits changes of bilat thumbs, 1st and 2nd MCP and DIP, right knee with bony arthritis changes, mild generalized tenderness, no laxity, no swelling      Assessment: Encounter Diagnoses  Name Primary?  . Mixed dyslipidemia Yes  . Essential hypertension   . Anemia due to other cause   . Mild cognitive impairment   . Primary osteoarthritis involving multiple joints      Plan: Dyslipidemia-labs today, c/t Lipitor and niacin OTC  Hypertension- continue Benicar 40 mg   Anemia - labs today  Mild cognitive impairment - saw neurology since last visit here   OA - mobic 15mg  daily, can use topical capsaicin, routine exercise   [name] was seen today for fasting medication check and medication refill.  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 For blood pressure -     meloxicam (MOBIC) 15 MG tablet; Take 1 tablet (15 mg total) by mouth daily. For arthritis -     cyclobenzaprine (FLEXERIL) 5 MG tablet; Take 1 tablet (5 mg total) by mouth at bedtime as needed for muscle spasms.   Return pending labs.

## 2015-01-30 NOTE — Addendum Note (Signed)
Addended by: Christin Bach L on: 01/30/2015 12:12 PM   Modules accepted: Medications

## 2015-02-01 LAB — NMR LIPOPROFILE WITH LIPIDS
Cholesterol, Total: 274 mg/dL — ABNORMAL HIGH (ref 100–199)
HDL Particle Number: 29.7 umol/L — ABNORMAL LOW (ref >=30.5)
HDL Size: 10.4 nm (ref >=9.2)
HDL-C: 82 mg/dL (ref >39)
LARGE HDL: 13.7 umol/L (ref >=4.8)
LDL (calc): 177 mg/dL — ABNORMAL HIGH (ref 0–99)
LDL PARTICLE NUMBER: 1824 nmol/L — AB (ref <1000)
LDL Size: 21.3 nm (ref >=20.8)
Large VLDL-P: 1.4 nmol/L (ref <=2.7)
Small LDL Particle Number: <90 nmol/L (ref <=527)
Triglycerides: 73 mg/dL (ref 0–149)
VLDL Size: 41.9 nm (ref <=46.6)

## 2015-02-04 ENCOUNTER — Other Ambulatory Visit: Payer: Self-pay | Admitting: Medical

## 2015-06-04 ENCOUNTER — Ambulatory Visit: Payer: 59 | Admitting: Neurology

## 2015-06-04 LAB — HM MAMMOGRAPHY

## 2015-06-05 ENCOUNTER — Encounter: Payer: Self-pay | Admitting: Internal Medicine

## 2015-06-06 ENCOUNTER — Telehealth: Payer: Self-pay | Admitting: Medical

## 2015-06-06 NOTE — Telephone Encounter (Signed)
Patient was not aware of results as of yet but she was told from [facility] that her results would come through the mail and she hasn't received the results yet, so I did make her aware the results

## 2015-06-06 NOTE — Telephone Encounter (Signed)
Mammogram normal/no worrisome findings.  Verify if she already knew this info.  I want to try and cut down on duplicate communication.   I got the paper result from [facility] that shows result and that patient was informed of result.

## 2015-06-06 NOTE — Telephone Encounter (Signed)
LMOM TO CB. CLS 

## 2015-07-11 ENCOUNTER — Telehealth: Payer: Self-pay | Admitting: Medical

## 2015-07-11 NOTE — Telephone Encounter (Signed)
Pt says Benicar is too expensive. She did not pick up the med. Requesting a generic form of Benicar be sent in to the pharmacy.

## 2015-07-12 ENCOUNTER — Other Ambulatory Visit: Payer: Self-pay | Admitting: Medical

## 2015-07-12 MED ORDER — LOSARTAN POTASSIUM 25 MG PO TABS: 25.0000 mg | ORAL_TABLET | Freq: Every day | ORAL | Status: DC

## 2015-07-12 NOTE — Telephone Encounter (Signed)
Pt made an appt for first week in October.

## 2015-07-12 NOTE — Telephone Encounter (Signed)
Losartan sent in place of Benicar, and make f/u appt for 3-4 wk from now.

## 2015-08-07 ENCOUNTER — Encounter: Payer: Self-pay | Admitting: Medical

## 2015-08-07 ENCOUNTER — Ambulatory Visit (INDEPENDENT_AMBULATORY_CARE_PROVIDER_SITE_OTHER): Payer: PPO | Admitting: Medical

## 2015-08-07 VITALS — BP 130/70 | HR 64 | Wt 152.0 lb

## 2015-08-07 DIAGNOSIS — M159 Polyosteoarthritis, unspecified: Secondary | ICD-10-CM | POA: Insufficient documentation

## 2015-08-07 DIAGNOSIS — G3184 Mild cognitive impairment, so stated: Secondary | ICD-10-CM | POA: Diagnosis not present

## 2015-08-07 DIAGNOSIS — E782 Mixed hyperlipidemia: Secondary | ICD-10-CM

## 2015-08-07 DIAGNOSIS — M150 Primary generalized (osteo)arthritis: Secondary | ICD-10-CM

## 2015-08-07 DIAGNOSIS — Z23 Encounter for immunization: Secondary | ICD-10-CM | POA: Insufficient documentation

## 2015-08-07 DIAGNOSIS — I10 Essential (primary) hypertension: Secondary | ICD-10-CM | POA: Diagnosis not present

## 2015-08-07 DIAGNOSIS — M8949 Other hypertrophic osteoarthropathy, multiple sites: Secondary | ICD-10-CM

## 2015-08-07 MED ORDER — MELOXICAM 15 MG PO TABS: 15.0000 mg | ORAL_TABLET | Freq: Every day | ORAL | Status: DC

## 2015-08-07 MED ORDER — LOSARTAN POTASSIUM 50 MG PO TABS: 50.0000 mg | ORAL_TABLET | Freq: Every day | ORAL | Status: DC

## 2015-08-07 NOTE — Patient Instructions (Signed)
Continue your current medications  Please check insurance coverage for Shingles vaccine.  I do recommend you get the Tdap vaccine as well.  This is good for 10 years.  Return at your convenience for pap smear with Harland Dingwall, our new nurse practitioner.

## 2015-08-07 NOTE — Progress Notes (Signed)
Subjective: Chief Complaint  Patient presents with  . Follow-up    flu shot given. been checking at home and its been about 160/100. no other concerns.   Here for routine med check.   Been alone for a year.  Husband had been seeing another lady for 10 years, but she and husband finally separated a year ago.   Working through the divorce now.  Spends time with neighbors and friends.  Has neighbor 65yo she helps look after.   Compliant with losartan which was sent a few months ago when benicar more than doubled in price on her plan.  BPs running about the same as what we get here.  Mixed dyslipidemia - since last visit using Lipitor and added OTC Niacin 250mg  2 tablets daily, c/t ASA daily  Takes mobic for OA, does ok on this.    No new c/o.  Past Medical History  Diagnosis Date  . Hypertension   . DDD (degenerative disc disease), cervical   . Syncope 2012    cardiac stress test   . Osteoarthritis   . Wears glasses   . Wears partial dentures     upper and lower  . H/O mammogram 2013  . Mixed dyslipidemia   . History of exercise stress test 2007  . H/O echocardiogram 2007   ROS as in subjective   Objective: BP 130/70 mmHg  Pulse 64  Wt 152 lb (68.947 kg)  Gen: wd, wn, nad Heart: RRR, normal s1, s2, no murmurs Lungs clear Ext: no edema Pulses normal Psych - pleasant, good eye contact, answers questions approprietly   Assessment: Encounter Diagnoses  Name Primary?  . Mixed dyslipidemia Yes  . Essential hypertension   . Primary osteoarthritis involving multiple joints   . Mild cognitive impairment   . Need for prophylactic vaccination and inoculation against influenza   . Need for prophylactic vaccination against Streptococcus pneumoniae (pneumococcus)     Plan: Mixed dyslipidemia - since last visit she has continued Lipitor and OTC Niacin 500mg  daily, ASA 81mg  daily.  Labs today HTN - increase to Losartan 50mg .   We called out losartan since last visit to change  from Benicar due to cost. Mild cognitive impairment - sees neurology.  Seems to be doing better, working through her divorce which has been a big stressors C/t OTC Vit D daily. Counseled on the influenza virus vaccine.  Vaccine information sheet given.  Influenza vaccine given after consent obtained. Counseled on the pneumococcal vaccine.  Vaccine information sheet given.  Pneumococcal vaccine PPSV 23 given after consent obtained. Recommended Tdap and Shingles vaccines.   She will consider. She will f/u with Vickie here for breast/pap for female provider Advised she return within 65mo for Welcome To Medicare Visit.

## 2015-08-08 LAB — COMPREHENSIVE METABOLIC PANEL
ALBUMIN: 4.4 g/dL (ref 3.6–5.1)
ALT: 17 U/L (ref 6–29)
AST: 18 U/L (ref 10–35)
Alkaline Phosphatase: 92 U/L (ref 33–130)
BUN: 10 mg/dL (ref 7–25)
CHLORIDE: 106 mmol/L (ref 98–110)
CO2: 25 mmol/L (ref 20–31)
CREATININE: 0.55 mg/dL (ref 0.50–0.99)
Calcium: 10 mg/dL (ref 8.6–10.4)
Glucose, Bld: 91 mg/dL (ref 65–99)
Potassium: 4.4 mmol/L (ref 3.5–5.3)
SODIUM: 138 mmol/L (ref 135–146)
TOTAL PROTEIN: 6.9 g/dL (ref 6.1–8.1)
Total Bilirubin: 0.4 mg/dL (ref 0.2–1.2)

## 2015-08-10 LAB — CARDIO IQ(R) ADVANCED LIPID PANEL
APOLIPOPROTEIN (CARDIO IQ ADV LIPID PANEL): 73 mg/dL (ref 49–103)
Cholesterol, Total: 187 mg/dL (ref 125–200)
Cholesterol/HDL Ratio: 2.1 calc (ref <=5.0)
HDL Cholesterol: 89 mg/dL (ref >=46)
LDL Large: 10374 nmol/L (ref 5038–17886)
LDL Medium: 206 nmol/L (ref 121–397)
LDL Particle Number: 1353 nmol/L (ref 1016–2185)
LDL Peak Size: 224.8 Angstrom (ref >=218.2)
LDL SMALL: 197 nmol/L (ref 115–386)
LDL, Calculated: 88 mg/dL
Lipoprotein (a): 192 nmol/L — ABNORMAL HIGH (ref <75)
NON-HDL CHOLESTEROL (CARDIO IQ ADV LIPID PANEL): 98 mg/dL
TRIGLYCERIDES (CARDIO IQ ADV LIPID PANEL): 51 mg/dL

## 2015-08-20 ENCOUNTER — Telehealth: Payer: Self-pay | Admitting: Medical

## 2015-08-20 NOTE — Telephone Encounter (Signed)
Received request for topical and oral pain medication from a random pharmacy.  Did she order this?

## 2015-08-20 NOTE — Telephone Encounter (Signed)
Pt did not order those

## 2015-08-21 ENCOUNTER — Encounter: Payer: Self-pay | Admitting: Medical

## 2015-09-06 ENCOUNTER — Other Ambulatory Visit: Payer: Self-pay | Admitting: Medical

## 2015-10-17 ENCOUNTER — Telehealth (HOSPITAL_COMMUNITY): Payer: Self-pay | Admitting: *Deleted

## 2015-11-06 ENCOUNTER — Telehealth: Payer: Self-pay | Admitting: Family Medicine

## 2015-11-06 NOTE — Telephone Encounter (Signed)
Tom with [facility] called to change the patients appointment.  I explained the pt needed to do that.  Gershon Mussel said that they had completed the AWV and the patient had lots of testing done and wanted to make sure we received the results in time for the appointment.  I asked Tom who ordered the testing?  He said that is something insurance pays for .  I explained that the PCP would order any testing for our patients as should be done for continuity of care.  I also explained we do the AWV here for our patients.  I asked for his name and number.  He would only give me his first name, Gershon Mussel at [phone].   The patient will need to call to change their appointment.  He understood.  He stated AIC and Lipids, BP and AWV done.

## 2015-11-06 NOTE — Telephone Encounter (Signed)
noted 

## 2015-11-13 ENCOUNTER — Institutional Professional Consult (permissible substitution): Payer: PPO | Admitting: Medical

## 2015-11-25 ENCOUNTER — Institutional Professional Consult (permissible substitution): Payer: PPO | Admitting: Medical

## 2016-01-20 ENCOUNTER — Encounter: Payer: Self-pay | Admitting: Internal Medicine

## 2016-02-05 ENCOUNTER — Encounter: Payer: Self-pay | Admitting: Medical

## 2016-02-05 ENCOUNTER — Ambulatory Visit (INDEPENDENT_AMBULATORY_CARE_PROVIDER_SITE_OTHER): Payer: PPO | Admitting: Medical

## 2016-02-05 VITALS — BP 120/88 | HR 83 | Ht 60.75 in | Wt 157.0 lb

## 2016-02-05 DIAGNOSIS — R609 Edema, unspecified: Secondary | ICD-10-CM

## 2016-02-05 DIAGNOSIS — G3184 Mild cognitive impairment, so stated: Secondary | ICD-10-CM

## 2016-02-05 DIAGNOSIS — I10 Essential (primary) hypertension: Secondary | ICD-10-CM | POA: Diagnosis not present

## 2016-02-05 DIAGNOSIS — Z780 Asymptomatic menopausal state: Secondary | ICD-10-CM | POA: Diagnosis not present

## 2016-02-05 DIAGNOSIS — Z0000 Encounter for general adult medical examination without abnormal findings: Secondary | ICD-10-CM

## 2016-02-05 DIAGNOSIS — R635 Abnormal weight gain: Secondary | ICD-10-CM | POA: Diagnosis not present

## 2016-02-05 DIAGNOSIS — M150 Primary generalized (osteo)arthritis: Secondary | ICD-10-CM

## 2016-02-05 DIAGNOSIS — E559 Vitamin D deficiency, unspecified: Secondary | ICD-10-CM | POA: Diagnosis not present

## 2016-02-05 DIAGNOSIS — E782 Mixed hyperlipidemia: Secondary | ICD-10-CM

## 2016-02-05 DIAGNOSIS — M8949 Other hypertrophic osteoarthropathy, multiple sites: Secondary | ICD-10-CM

## 2016-02-05 DIAGNOSIS — M159 Polyosteoarthritis, unspecified: Secondary | ICD-10-CM

## 2016-02-05 LAB — POCT URINALYSIS DIPSTICK
Bilirubin, UA: NEGATIVE
GLUCOSE UA: NEGATIVE
Ketones, UA: NEGATIVE
NITRITE UA: NEGATIVE
Protein, UA: NEGATIVE
Spec Grav, UA: >=1.03
UROBILINOGEN UA: NEGATIVE
pH, UA: 6

## 2016-02-05 LAB — CBC
HCT: 38.4 % (ref 35.0–45.0)
Hemoglobin: 12.5 g/dL (ref 11.7–15.5)
MCH: 31.1 pg (ref 27.0–33.0)
MCHC: 32.6 g/dL (ref 32.0–36.0)
MCV: 95.5 fL (ref 80.0–100.0)
MPV: 11.3 fL (ref 7.5–12.5)
Platelets: 271 10*3/uL (ref 140–400)
RBC: 4.02 MIL/uL (ref 3.80–5.10)
RDW: 13.5 % (ref 11.0–15.0)
WBC: 3.6 10*3/uL — ABNORMAL LOW (ref 4.0–10.5)

## 2016-02-05 NOTE — Progress Notes (Signed)
Subjective:    Nancy Castro is a 65 y.o. female who presents for Preventative Services visit and chronic medical problems/med check visit.  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 Chief Complaint  Patient presents with  . Annual Exam    fasting. sees eye doctor, sees dentist. will come back to see Nancy Castro for breast and pelvic. urinary frequency. no other problems or concerns    Primary Care Provider Nancy Oxford, PA-C here for primary care  Current Health Care Team:  Sees Dentist  Sees Eye doctor  Dr. Ellouise Newer, neurology  Dr. Verl Blalock, GI  D. Kirk Ruths, cardiology [name], [place] here for primary care  Medical Services you may have received from other than [facility] providers in the past year (date may be approximate) none  Exercise Current exercise habits: mowing the yard, walking, exerciser machine in house   Nutrition/Diet Current diet: in general, a "healthy" diet  , eats biggest meal in the morning, juices, avoids salt  Depression Screen Depression screen [facility] 2/9 02/05/2016  Decreased Interest 0  Down, Depressed, Hopeless 0  PHQ - 2 Score 0    Activities of Daily Living Screen/Functional Status Survey Is the patient deaf or have difficulty hearing?: No Does the patient have difficulty seeing, even when wearing glasses/contacts?: No Does the patient have difficulty concentrating, remembering, or making decisions?: No Does the patient have difficulty walking or climbing stairs?: No Does the patient have difficulty dressing or bathing?: No Does the patient have difficulty doing errands alone such as visiting a doctor's office or shopping?: No  Can patient draw a clock face showing 3:15 o'clock, yes  Fall Risk Screen Fall Risk  02/05/2016 08/07/2015  Falls in the past year? No No    Gait Assessment: Normal gait observed yes  Advanced directives Does patient have a [place]? Yes Does patient have a Living  Will? Yes  Past Medical History  Diagnosis Date  . Hypertension   . DDD (degenerative disc disease), cervical   . Syncope 2012    cardiac stress test   . Osteoarthritis   . Wears glasses   . Wears partial dentures     upper and lower  . H/O mammogram 2013  . Mixed dyslipidemia   . History of exercise stress test 2007  . H/O echocardiogram 2007    Past Surgical History  Procedure Laterality Date  . Partial hysterectomy      fibroids; has both ovaries  . Knee arthroscopy      right  . Lipoma excision      back x 2  . Colonoscopy      2014, polyps, repeat 3 years    Social History   Social History  . Marital Status: Married    Spouse Name: N/A  . Number of Children: N/A  . Years of Education: N/A   Occupational History  . Not on file.   Social History Main Topics  . Smoking status: Former Smoker -- 0.25 packs/day for 5 years    Quit date: 02/08/1987  . Smokeless tobacco: Never Used  . Alcohol Use: No  . Drug Use: No  . Sexual Activity: Not on file   Other Topics Concern  . 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  No grandchildren.   Sews - hobby    Family History  Problem Relation Age of Onset  . Cancer Mother     died of breast cancer  . Cancer Father     died of lung cancer, mets to brain  . Hypertension Maternal Grandmother   . Heart disease Neg Hx   . Stroke Neg Hx   . Diabetes Neg Hx   . Colon cancer Neg Hx   . Cancer Sister     lung, mets to brain  . Cancer Brother     lung and stomach     Current outpatient prescriptions:  .  aspirin 81 MG tablet, Take 81 mg by mouth daily., Disp: , Rfl:  .  atorvastatin (LIPITOR) 40 MG tablet, TAKE 1 TABLET BY MOUTH DAILY, Disp: 90 tablet, Rfl: 1 .  losartan (COZAAR) 50 MG tablet, Take 1 tablet (50 mg total) by mouth daily., Disp: 90 tablet, Rfl: 3 .  MULTIPLE VITAMIN PO, Take by mouth  daily., Disp: , Rfl:  .  niacin 250 MG tablet, Take 250 mg by mouth at bedtime. 2 tablets OTC daily, Disp: , Rfl:  .  Vitamin D, Cholecalciferol, 400 UNITS TABS, Take 2 tablets by mouth., Disp: , Rfl:  .  cyclobenzaprine (FLEXERIL) 5 MG tablet, Take 1 tablet (5 mg total) by mouth at bedtime as needed for muscle spasms. (Patient not taking: Reported on 02/05/2016), Disp: 30 tablet, Rfl: 1 .  meloxicam (MOBIC) 15 MG tablet, Take 1 tablet (15 mg total) by mouth daily. For arthritis (Patient not taking: Reported on 02/05/2016), Disp: 90 tablet, Rfl: 1  No Known Allergies  History reviewed: allergies, current medications, past family history, past medical history, past social history, past surgical history and problem list  Chronic issues discussed: Dyslipidemia - She is compliant with Lipitor 40mg  daily and niacin 250mg  2 tablets daily without c/o  HTN - She is compliant with losartan 50mg  daily without c/o  She requests something cheaper than meloxicam for OA  Acute issues discussed: Urinating more since starting the last new medication Losartan.    Has gained some weight in recent weeks and unsure why.  Diet, activity all the same.  Objective:      Biometrics BP 120/88 mmHg  Pulse 83  Ht 5' 0.75" (1.543 m)  Wt 157 lb (71.215 kg)  BMI 29.91 kg/m2  Cognitive Testing  Alert? Yes  Normal Appearance?Yes  Oriented to person? Yes  Place? Yes   Time? Yes  Recall of three objects?  Yes  Can perform simple calculations? Yes  Displays appropriate judgment?Yes  Can read the correct time from a watch face?Yes  General appearance: alert, no distress, WD/WN, AA  female  Nutritional Status: Inadequate calore intake? no Loss of muscle mass? no Loss of fat beneath skin? no Localized or general edema? no Diminished functional status? no  Other pertinent exam: HEENT: normocephalic, sclerae anicteric, TMs pearly, nares patent, no discharge or erythema, pharynx normal Oral cavity: MMM, no  lesions, dentures present upper and lower Neck: supple, no lymphadenopathy, no thyromegaly, no masses, no bruits Heart: RRR, normal S1, S2, no murmurs Lungs: CTA bilaterally, no wheezes, rhonchi, or rales Abdomen: +bs, soft, non tender, non distended, no masses, no hepatomegaly, no splenomegaly, no bruits Back: nontender, mid right back short surgical scars from prior lipoma excision Musculoskeletal: right knee with anterior surgical scars, right knee with bony arthritis changes and superior medial portion of tibia with scooped out deformity from prior surgery, otherwise UE and LE nontender, no swelling, no obvious deformity Extremities: no edema, no cyanosis,  no clubbing Pulses: 2+ symmetric, upper and lower extremities, normal cap refill Neurological: alert, oriented x 3, CN2-12 intact, strength normal upper extremities and lower extremities, sensation normal throughout, DTRs 2+ throughout, no cerebellar signs, gait normal Psychiatric: normal affect, behavior normal, pleasant    Assessment:   Encounter Diagnoses  Name Primary?  . Routine general medical examination at a health care facility Yes  . Medicare annual wellness visit, initial   . Mixed dyslipidemia   . Essential hypertension   . Primary osteoarthritis involving multiple joints   . Mild cognitive impairment   . Weight gain   . Edema, unspecified type      Plan:   A preventative services visit was completed today.  During the course of the visit today, we discussed and counseled about appropriate screening and preventive services.  A health risk assessment was established today that included a review of current medications, allergies, social history, family history, medical and preventative health history, biometrics, and preventative screenings to identify potential safety concerns or impairments.  A personalized plan was printed today for your records and use.   Personalized health advice and education was given today to  reduce health risks and promote self management and wellness.  Information regarding end of life planning was discussed today.  Conditions/risks identified: Swelling, weight gain - and we will follow up pending labs  Chronic problems discussed today: Hypertension - continue your Losartan 50mg  daily  Dyslipidemia - continue Aspirin 81mg  daily, Niacin 250mg , 2 tablets daily, Lipitor 40mg  daily at bedtime  Continue Vitamin D OTC daily  Osteoarthritis - change to Naprosyn as needed up to twice daily, but try not to use this every day.  Consider topical OTC arthritis cream such as capsicin.   If you continue to have knee swelling, we will need to refer you back to [place]  Acute problems discussed today: Wait gain, urinary frequency - follow up pending labs  Recommendations:  I recommend a yearly ophthalmology/optometry visit for glaucoma screening and eye checkup  I recommended a yearly dental visit for hygiene and checkup  Advanced directives - discussed nature and purpose of Advanced Directives, encouraged them to complete them if they have not done so and/or encouraged them to get [place] a copy if they have done this already.  I recommend a screening mammogram every year  Your last colonoscopy was 2014.  I recommend you have a repeat at this time, and you have already been contacted by gastroenterology  Your last bone density screen was ?  I recommend you have a bone density evaluation  Your last diabetes screen was today.     Your last lipid/cholesterol screen was in recent months and continue the same medication for now   Referrals today: Bone Density Scan  Immunizations: I recommended a yearly influenza vaccine, typically in September when the vaccine is usually available Is the Pneumococcal vaccine up to date: yes. Is the Shingles vaccine up to date: no.  Check costs with local pharmacy such as [facility] Is the Td/Tdap vaccine up to date: no.  Check  costs with local pharmacy such as [facility] was seen today for annual exam.  Diagnoses and all orders for this visit:  Routine general medical examination at a health care facility -     POCT urinalysis dipstick  Medicare annual wellness visit, initial  Mixed dyslipidemia -     atorvastatin (LIPITOR) 40 MG tablet; Take 1 tablet (40 mg total)  by mouth daily. -     aspirin 81 MG tablet; Take 1 tablet (81 mg total) by mouth daily. -     niacin 250 MG tablet; Take 1 tablet (250 mg total) by mouth at bedtime. 2 tablets OTC daily  Essential hypertension -     CBC -     Basic metabolic panel -     Hemoglobin A1c -     TSH -     losartan (COZAAR) 50 MG tablet; Take 1 tablet (50 mg total) by mouth daily.  Primary osteoarthritis involving multiple joints -     DG Bone Density; Future -     naproxen (NAPROSYN) 375 MG tablet; Take 1 tablet (375 mg total) by mouth 2 (two) times daily with a meal.  Mild cognitive impairment -     DG Bone Density; Future  Weight gain -     CBC -     Basic metabolic panel -     Hemoglobin A1c -     TSH  Edema, unspecified type -     CBC -     Basic metabolic panel -     Hemoglobin A1c -     TSH -     DG Bone Density; Future  Postmenopausal -     DG Bone Density; Future  Vitamin D deficiency -     DG Bone Density; Future      Medicare Attestation A preventative services visit was completed today.  During the course of the visit the patient was educated and counseled about appropriate screening and preventive services.  A health risk assessment was established with the patient that included a review of current medications, allergies, social history, family history, medical and preventative health history, biometrics, and preventative screenings to identify potential safety concerns or impairments.  A personalized plan was printed today for the patient's records and use.   Personalized health advice and education was given today  to reduce health risks and promote self management and wellness.  Information regarding end of life planning was discussed today.  Nancy Oxford, PA-C   02/06/2016

## 2016-02-06 ENCOUNTER — Telehealth: Payer: Self-pay | Admitting: Medical

## 2016-02-06 ENCOUNTER — Encounter: Payer: Self-pay | Admitting: Medical

## 2016-02-06 DIAGNOSIS — Z780 Asymptomatic menopausal state: Secondary | ICD-10-CM | POA: Insufficient documentation

## 2016-02-06 DIAGNOSIS — R635 Abnormal weight gain: Secondary | ICD-10-CM | POA: Insufficient documentation

## 2016-02-06 DIAGNOSIS — Z0000 Encounter for general adult medical examination without abnormal findings: Secondary | ICD-10-CM | POA: Insufficient documentation

## 2016-02-06 DIAGNOSIS — R609 Edema, unspecified: Secondary | ICD-10-CM | POA: Insufficient documentation

## 2016-02-06 LAB — BASIC METABOLIC PANEL
BUN: 17 mg/dL (ref 7–25)
CO2: 23 mmol/L (ref 20–31)
Calcium: 10.2 mg/dL (ref 8.6–10.4)
Chloride: 105 mmol/L (ref 98–110)
Creat: 0.62 mg/dL (ref 0.50–0.99)
Glucose, Bld: 103 mg/dL — ABNORMAL HIGH (ref 65–99)
Potassium: 4.3 mmol/L (ref 3.5–5.3)
Sodium: 140 mmol/L (ref 135–146)

## 2016-02-06 LAB — HEMOGLOBIN A1C
Hgb A1c MFr Bld: 5.5 % (ref <5.7)
MEAN PLASMA GLUCOSE: 111 mg/dL

## 2016-02-06 LAB — TSH: TSH: 3.46 m[IU]/L

## 2016-02-06 MED ORDER — NAPROXEN 375 MG PO TABS: 375.0000 mg | ORAL_TABLET | Freq: Two times a day (BID) | ORAL | Status: DC

## 2016-02-06 MED ORDER — ASPIRIN 81 MG PO TABS: 81.0000 mg | ORAL_TABLET | Freq: Every day | ORAL | Status: DC

## 2016-02-06 MED ORDER — NIACIN 250 MG PO TABS: 250.0000 mg | ORAL_TABLET | Freq: Every day | ORAL | Status: DC

## 2016-02-06 MED ORDER — LOSARTAN POTASSIUM 50 MG PO TABS: 50.0000 mg | ORAL_TABLET | Freq: Every day | ORAL | Status: DC

## 2016-02-06 MED ORDER — ATORVASTATIN CALCIUM 40 MG PO TABS: 40.0000 mg | ORAL_TABLET | Freq: Every day | ORAL | Status: DC

## 2016-02-06 NOTE — Telephone Encounter (Signed)
Pt is aware and said she can not afford the shingles. Said she will try the new medication and let us know if she will do the bone density when she comes in to see Jocelyn Lamer

## 2016-02-06 NOTE — Telephone Encounter (Signed)
Yes i was

## 2016-02-06 NOTE — Telephone Encounter (Signed)
Labs were fine  Please mail her the AV summary.  1- Swelling, weight gain - labs show no worrisome findings.  Review your calorie intake and exercise as this may just be related to your diet and exercise.  2- Osteoarthritis - change to Naprosyn as needed up to twice daily, but try not to use this every day.  Consider topical OTC arthritis cream such as capsicin.   If you continue to have knee swelling, we will need to refer you back to [place] [facility]  3- I recommend you have a bone density evaluation.  Please help her schedule this  4 - Check costs of shingles and Tdap vaccine with local pharmacy such as [facility]

## 2016-02-06 NOTE — Telephone Encounter (Signed)
Call her about labs and AV summary.  See the telephone encounter I accidentally closed

## 2016-02-06 NOTE — Telephone Encounter (Signed)
I assume you are referring to naprosyn correct?

## 2016-02-06 NOTE — Patient Instructions (Signed)
A preventative services visit was completed today.  During the course of the visit today, we discussed and counseled about appropriate screening and preventive services.  A health risk assessment was established today that included a review of current medications, allergies, social history, family history, medical and preventative health history, biometrics, and preventative screenings to identify potential safety concerns or impairments.  A personalized plan was printed today for your records and use.   Personalized health advice and education was given today to reduce health risks and promote self management and wellness.  Information regarding end of life planning was discussed today.  Conditions/risks identified: Swelling, weight gain - labs show no worrisome findings.  Review your calorie intake and exercise as this may just be related to your diet and exercise.  Chronic problems discussed today: Hypertension - continue your Losartan 50mg  daily  Dyslipidemia - continue Aspirin 81mg  daily, Niacin 250mg , 2 tablets daily, Lipitor 40mg  daily at bedtime  Continue Vitamin D OTC daily  Osteoarthritis - change to Naprosyn as needed up to twice daily, but try not to use this every day.  Consider topical OTC arthritis cream such as capsicin.   If you continue to have knee swelling, we will need to refer you back to [facility]   Recommendations:  I recommend a yearly ophthalmology/optometry visit for glaucoma screening and eye checkup  I recommended a yearly dental visit for hygiene and checkup  Advanced directives - discussed nature and purpose of Advanced Directives, encouraged them to complete them if they have not done so and/or encouraged them to get [place] a copy if they have done this already.  I recommend a screening mammogram every year  Your last colonoscopy was 2014.  I recommend you have a repeat at this time, and you have already been contacted by gastroenterology  Your  last bone density screen was ?  I recommend you have a bone density evaluation.  We will call you about this.  Your last diabetes screen was today.     Your last lipid/cholesterol screen was in recent months and continue the same medication for now   Referrals today: Bone Density Scan  Immunizations: I recommended a yearly influenza vaccine, typically in September when the vaccine is usually available Is the Pneumococcal vaccine up to date: yes. Is the Shingles vaccine up to date: no.  Check costs with local pharmacy such as [facility] Is the Td/Tdap vaccine up to date: no.  Check costs with local pharmacy such as [facility]

## 2016-02-25 ENCOUNTER — Encounter: Payer: Self-pay | Admitting: Family Medicine

## 2016-02-25 ENCOUNTER — Ambulatory Visit (INDEPENDENT_AMBULATORY_CARE_PROVIDER_SITE_OTHER): Payer: PPO | Admitting: Family Medicine

## 2016-02-25 VITALS — BP 124/80 | HR 64 | Wt 157.4 lb

## 2016-02-25 DIAGNOSIS — Z01419 Encounter for gynecological examination (general) (routine) without abnormal findings: Secondary | ICD-10-CM

## 2016-02-25 DIAGNOSIS — Z1239 Encounter for other screening for malignant neoplasm of breast: Secondary | ICD-10-CM

## 2016-02-25 DIAGNOSIS — N898 Other specified noninflammatory disorders of vagina: Secondary | ICD-10-CM

## 2016-02-25 NOTE — Progress Notes (Signed)
   Subjective:    Patient ID: Nancy Castro, female    DOB: [birth year], 65 y.o.   MRN: [id number]  HPI Chief Complaint  Patient presents with  . pap only    breast and pelvic   She is here for a breast and pelvic exam. She was seen by her PCP, Audelia Acton, within the past month for complete physical exam and preferred that a female do her breast and pelvic exams.  She had a hysterectomy in 1985 for fibroids.  States she has some vaginal dryness and has been using over the counter monistat for this. She also reports taking tub baths with bath salts. Denies vaginal discharge, itching, or irritation.  Denies fever, chills, urinary symptoms.  Is not sexually active.  Denies need for STI testing today  Last mammogram: 06/2015   Review of Systems Pertinent positives and negatives in the history of present illness.     Objective:   Physical Exam  Pulmonary/Chest: Right breast exhibits no mass, no skin change and no tenderness. Left breast exhibits no mass, no skin change and no tenderness.  Normal breast exam.   Genitourinary: There is no tenderness or lesion on the right labia. There is no tenderness or lesion on the left labia. Right adnexum displays no mass, no tenderness and no fullness. Left adnexum displays no mass, no tenderness and no fullness.  Vagina with dryness and mild atrophy. No discharge or tenderness.    BP 124/80 mmHg  Pulse 64  Wt 157 lb 6.4 oz (71.396 kg)      Assessment & Plan:  Encounter for routine pelvic examination  Vaginal dryness  Screening breast examination  Discussed that she does appear to have some dryness and minimal atrophy to vagina. Recommend that she avoid monistat or any other chemicals or harsh soaps to the area and avoid tub baths for a few weeks. Discussed taking showers and using Dove or mild soap. Recommend giving these interventions some time and seeing if she notices any improvement in vaginal dryness and if not then she will follow up for  possible topical estrogen therapy. Follow up with Audelia Acton, PCP as recommended.

## 2016-03-18 ENCOUNTER — Encounter: Payer: Self-pay | Admitting: Medical

## 2016-04-02 ENCOUNTER — Other Ambulatory Visit: Payer: Self-pay | Admitting: Medical

## 2016-04-03 NOTE — Telephone Encounter (Signed)
Is this ok to refill?  

## 2016-05-04 ENCOUNTER — Other Ambulatory Visit: Payer: Self-pay | Admitting: Medical

## 2016-05-04 NOTE — Telephone Encounter (Signed)
Is this ok to refill?  

## 2016-06-25 ENCOUNTER — Telehealth: Payer: Self-pay

## 2016-06-25 NOTE — Telephone Encounter (Signed)
Please call her and tell her that is okay that she stop the Lipitor for now and follow up with Audelia Acton next week regarding this. He may want to change the dose or medication altogether.

## 2016-06-25 NOTE — Telephone Encounter (Signed)
Pt called the office to let us know that she is having pain and swelling in hands with taking Lipitor. She states that Tuesday she stopped taking the lipitor and the pain and swelling improved. /RLB   Please callback at [phone]

## 2016-06-25 NOTE — Telephone Encounter (Signed)
Tried to call the patient but phone rang and rang

## 2016-06-25 NOTE — Telephone Encounter (Signed)
Pt was notified and states what does she need to do next. Please asked if she could get a call Tuesday instead of monday

## 2016-06-28 NOTE — Telephone Encounter (Signed)
Call her Tuesday and see if her symptoms have continued to resolve?  Has she had similar reaction with statins prior such as Crestor, Simvastatin or other cholesterol medications?

## 2016-06-29 NOTE — Telephone Encounter (Signed)
Error - duplicate

## 2016-07-02 NOTE — Telephone Encounter (Signed)
Pt said it has slacked off, stated she has never been in her hands but has had reactions like this before from Statins.

## 2016-07-02 NOTE — Telephone Encounter (Signed)
Ok, the next time she is in for appt, lets discuss risk factors for heart disease, treatment options.  Make sure she has a f/u appt

## 2016-07-03 NOTE — Telephone Encounter (Signed)
Pt questions if she can take 1/2 tablet of the Lipitor, instead of the whole 40mg .  She would just like to cut it in half, not stop taking it. Does not want to schedule appt at this time.

## 2016-07-06 ENCOUNTER — Encounter: Payer: Self-pay | Admitting: Medical

## 2016-07-06 ENCOUNTER — Other Ambulatory Visit: Payer: Self-pay | Admitting: Medical

## 2016-07-06 NOTE — Telephone Encounter (Signed)
Lets try 1/2 tablet daily for the time being.  See if she tolerates this.

## 2016-07-07 NOTE — Telephone Encounter (Signed)
Pt notified ok to take 20mg  of Liptor (1/2 of the 40mg ) and call back with update within week or 2. Victorino December

## 2016-08-20 ENCOUNTER — Other Ambulatory Visit: Payer: Self-pay | Admitting: Medical

## 2016-08-20 ENCOUNTER — Ambulatory Visit (INDEPENDENT_AMBULATORY_CARE_PROVIDER_SITE_OTHER): Payer: PPO | Admitting: Medical

## 2016-08-20 ENCOUNTER — Encounter: Payer: Self-pay | Admitting: Medical

## 2016-08-20 VITALS — BP 122/86 | HR 76 | Temp 97.7°F | Ht 61.0 in | Wt 154.2 lb

## 2016-08-20 DIAGNOSIS — Z0000 Encounter for general adult medical examination without abnormal findings: Secondary | ICD-10-CM | POA: Insufficient documentation

## 2016-08-20 DIAGNOSIS — M79641 Pain in right hand: Secondary | ICD-10-CM | POA: Diagnosis not present

## 2016-08-20 DIAGNOSIS — Z1239 Encounter for other screening for malignant neoplasm of breast: Secondary | ICD-10-CM

## 2016-08-20 DIAGNOSIS — Z9181 History of falling: Secondary | ICD-10-CM | POA: Insufficient documentation

## 2016-08-20 DIAGNOSIS — M159 Polyosteoarthritis, unspecified: Secondary | ICD-10-CM

## 2016-08-20 DIAGNOSIS — Z23 Encounter for immunization: Secondary | ICD-10-CM

## 2016-08-20 DIAGNOSIS — M150 Primary generalized (osteo)arthritis: Secondary | ICD-10-CM

## 2016-08-20 DIAGNOSIS — M8949 Other hypertrophic osteoarthropathy, multiple sites: Secondary | ICD-10-CM

## 2016-08-20 DIAGNOSIS — Z1231 Encounter for screening mammogram for malignant neoplasm of breast: Secondary | ICD-10-CM | POA: Diagnosis not present

## 2016-08-20 MED ORDER — MELOXICAM 7.5 MG PO TABS: 7.5000 mg | ORAL_TABLET | Freq: Every day | ORAL | 2 refills | Status: DC

## 2016-08-20 MED ORDER — CYCLOBENZAPRINE HCL 5 MG PO TABS: 5.0000 mg | ORAL_TABLET | Freq: Every evening | ORAL | 1 refills | Status: DC | PRN

## 2016-08-20 NOTE — Patient Instructions (Signed)
Recommendations:  Begin Mobic/Meloxicam arthritis pill in the evening  Begin back on Flexeril as needed for aches/spasm in muscle  Please schedule your mammogram and bone density scan.   The orders are already in  We updated your flu shot today  Go back to the regular dose of your cholesterol medications  STOP naprosyn.

## 2016-08-20 NOTE — Addendum Note (Signed)
Addended by: Milta Deiters on: 08/20/2016 11:08 AM   Modules accepted: Orders

## 2016-08-20 NOTE — Progress Notes (Signed)
Subjective: Chief Complaint  Patient presents with  . Medication Management  . Hand Pain    x2 months, R hand,intermittent, worst in the morning when she get up, hand "pops" per patient   Here for c/o pain in hand, right hand, mostly of MCP of 2nd - 3rd fingers.   Sometimes feels swollen.  Aches when she goes to bed, but in the mornings can be stiff and hurting.  No pain in left hand.  Right knee gives her problems, but no other joint pain or swelling.   She cut back on statin thinking it was causing the right hand pain.  Flexeril does seem to help the right hand but she ran out of it.    hasn't scheduled the bone density scan or mammogram.  Hasn't had repeat colonoscopy due to nobody to take her.  All family lives out of town.   Past Medical History:  Diagnosis Date  . DDD (degenerative disc disease), cervical   . H/O echocardiogram 2007  . H/O mammogram 2013  . History of exercise stress test 2007  . Hypertension   . Mixed dyslipidemia   . Osteoarthritis   . Syncope 2012   cardiac stress test   . Wears glasses   . Wears partial dentures    upper and lower    Past Surgical History:  Procedure Laterality Date  . COLONOSCOPY     2014, polyps, repeat 3 years  . KNEE ARTHROSCOPY     right  . LIPOMA EXCISION     back x 2  . PARTIAL HYSTERECTOMY     fibroids; has both ovaries    Social History   Social History  . Marital status: Married    Spouse name: N/A  . Number of children: N/A  . Years of education: N/A   Occupational History  . Not on file.   Social History Main Topics  . Smoking status: Former Smoker    Packs/day: 0.25    Years: 5.00    Quit date: 02/08/1987  . Smokeless tobacco: Never Used  . Alcohol use No  . Drug use: No  . Sexual activity: Not on file   Other Topics Concern  . 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  No  grandchildren.   Sews - hobby    Family History  Problem Relation Age of Onset  . Cancer Mother     died of breast cancer  . Cancer Father     died of lung cancer, mets to brain  . Cancer Sister     lung, mets to brain  . Cancer Brother     lung and stomach  . Hypertension Maternal Grandmother   . Heart disease Neg Hx   . Stroke Neg Hx   . Diabetes Neg Hx   . Colon cancer Neg Hx      Current Outpatient Prescriptions:  .  aspirin 81 MG tablet, Take 1 tablet (81 mg total) by mouth daily., Disp: 90 tablet, Rfl: 3 .  atorvastatin (LIPITOR) 40 MG tablet, Take 1 tablet (40 mg total) by mouth daily. (Patient taking differently: Take 20 mg by mouth daily. ), Disp: 90 tablet, Rfl: 3 .  losartan (COZAAR) 50 MG tablet, Take 1 tablet (50 mg total) by mouth daily., Disp: 90 tablet, Rfl: 3 .  MULTIPLE VITAMIN PO, Take by mouth daily., Disp: , Rfl:  .  niacin 250 MG tablet, Take 1 tablet (250 mg total) by mouth at bedtime. 2 tablets OTC daily, Disp: 180 tablet, Rfl: 3 .  Vitamin D, Cholecalciferol, 400 UNITS TABS, Take 2 tablets by mouth., Disp: , Rfl:  .  cyclobenzaprine (FLEXERIL) 5 MG tablet, Take 1 tablet (5 mg total) by mouth at bedtime as needed for muscle spasms., Disp: 30 tablet, Rfl: 1 .  meloxicam (MOBIC) 7.5 MG tablet, Take 1 tablet (7.5 mg total) by mouth at bedtime., Disp: 30 tablet, Rfl: 2  No Known Allergies  ROS as in subjective   Objective: BP 122/86 (BP Location: Right Arm, Patient Position: Sitting, Cuff Size: Large)   Pulse 76   Temp 97.7 F (36.5 C) (Oral)   Ht 5\' 1"  (1.549 m)   Wt 154 lb 3.2 oz (69.9 kg)   SpO2 98%   BMI 29.14 kg/m   Gen: wd, wn, nad Skin: unremarkable MSK: tender over right 2nd and 3rd MCP, there are bony arthritis changes of 2nd and 3rd MCP along with slight deviation of those 2 fingers medially, the bony arthritis changes seems to rub on tendon causing a slightly non smooth tracking of the finger (3rd digit right hand).   Otherwise hand non  tender, no other swelling or deformity.  Rest of arms unremarkable Lungs clear Heart RRR, normal s1, s2, on murmurs No ext edema Pleasant, good eye contacts, demeanor appropriate   Assessment:  Encounter Diagnoses  Name Primary?  . Primary osteoarthritis involving multiple joints Yes  . Hand pain, right   . Screening for breast cancer   . History of fall   . Need for prophylactic vaccination and inoculation against influenza      Plan: OA - right hand.  Change to Mobic, can use flexeril prn since it gives her some benefit.  If not improving in next few weeks, repeat image or consider ortho consult given the mild locking of the joint  Gave following recommendations  Counseled on the influenza virus vaccine.  Vaccine information sheet given.   High dose Influenza vaccine given after consent obtained.   Patient Instructions  Recommendations:  Begin Mobic/Meloxicam arthritis pill in the evening  Begin back on Flexeril as needed for aches/spasm in muscle  Please schedule your mammogram and bone density scan.   The orders are already in  We updated your flu shot today  Go back to the regular dose of your cholesterol medications  STOP naprosyn.     Nancy Castro was seen today for medication management and hand pain.  Diagnoses and all orders for this visit:  Primary osteoarthritis involving multiple joints  Hand pain, right  Screening for breast cancer  History of fall  Need for prophylactic vaccination and inoculation against influenza  Other orders -     cyclobenzaprine (FLEXERIL) 5 MG tablet; Take 1 tablet (5 mg total) by mouth at bedtime as needed for muscle spasms. -     meloxicam (MOBIC) 7.5 MG tablet; Take 1 tablet (7.5 mg total) by mouth at bedtime.

## 2016-08-21 ENCOUNTER — Telehealth: Payer: Self-pay | Admitting: Internal Medicine

## 2016-08-21 ENCOUNTER — Other Ambulatory Visit: Payer: Self-pay | Admitting: Medical

## 2016-08-21 DIAGNOSIS — E2839 Other primary ovarian failure: Secondary | ICD-10-CM

## 2016-08-21 DIAGNOSIS — Z1231 Encounter for screening mammogram for malignant neoplasm of breast: Secondary | ICD-10-CM

## 2016-08-21 NOTE — Telephone Encounter (Signed)
Ok, then lets use estrogen deficiency

## 2016-08-21 NOTE — Telephone Encounter (Signed)
[facility] called and states that pt's insurance will not cover the dx for bone density and needs it changed. She said you can put in estrogen deficiency and it should cover it

## 2016-08-23 ENCOUNTER — Telehealth: Payer: Self-pay | Admitting: Medical

## 2016-08-23 NOTE — Telephone Encounter (Signed)
P.A. CYCLOBENZAPRINE  °

## 2016-08-24 NOTE — Addendum Note (Signed)
Addended by: Minette Headland A on: 08/24/2016 02:50 PM   Modules accepted: Orders

## 2016-08-24 NOTE — Telephone Encounter (Signed)
Cancelled old order and put in new one

## 2016-08-25 NOTE — Telephone Encounter (Signed)
[facility] called to verify that pt takes this medication as needed.  They will fax determination

## 2016-08-26 ENCOUNTER — Ambulatory Visit
Admission: RE | Admit: 2016-08-26 | Discharge: 2016-08-26 | Disposition: A | Discharge status: Home / Self Care | Payer: PPO | Source: Ambulatory Visit | Attending: Medical | Admitting: Medical

## 2016-08-26 DIAGNOSIS — Z1231 Encounter for screening mammogram for malignant neoplasm of breast: Secondary | ICD-10-CM

## 2016-08-30 NOTE — Telephone Encounter (Signed)
P.A. Approved til 11/01/17 

## 2016-09-09 NOTE — Telephone Encounter (Signed)
Pt informed

## 2016-12-17 ENCOUNTER — Ambulatory Visit (INDEPENDENT_AMBULATORY_CARE_PROVIDER_SITE_OTHER): Payer: Medicare Other | Admitting: Medical

## 2016-12-17 ENCOUNTER — Encounter: Payer: Self-pay | Admitting: Medical

## 2016-12-17 ENCOUNTER — Other Ambulatory Visit: Payer: Self-pay | Admitting: Medical

## 2016-12-17 ENCOUNTER — Encounter: Payer: PPO | Admitting: Family Medicine

## 2016-12-17 ENCOUNTER — Encounter: Payer: PPO | Admitting: Medical

## 2016-12-17 VITALS — BP 140/80 | HR 85 | Wt 149.6 lb

## 2016-12-17 DIAGNOSIS — Z780 Asymptomatic menopausal state: Secondary | ICD-10-CM | POA: Diagnosis not present

## 2016-12-17 DIAGNOSIS — E2839 Other primary ovarian failure: Secondary | ICD-10-CM

## 2016-12-17 DIAGNOSIS — E559 Vitamin D deficiency, unspecified: Secondary | ICD-10-CM | POA: Diagnosis not present

## 2016-12-17 DIAGNOSIS — M150 Primary generalized (osteo)arthritis: Secondary | ICD-10-CM | POA: Diagnosis not present

## 2016-12-17 DIAGNOSIS — I10 Essential (primary) hypertension: Secondary | ICD-10-CM | POA: Diagnosis not present

## 2016-12-17 DIAGNOSIS — E782 Mixed hyperlipidemia: Secondary | ICD-10-CM | POA: Diagnosis not present

## 2016-12-17 DIAGNOSIS — M8949 Other hypertrophic osteoarthropathy, multiple sites: Secondary | ICD-10-CM

## 2016-12-17 DIAGNOSIS — Z9181 History of falling: Secondary | ICD-10-CM

## 2016-12-17 DIAGNOSIS — Z131 Encounter for screening for diabetes mellitus: Secondary | ICD-10-CM

## 2016-12-17 DIAGNOSIS — R609 Edema, unspecified: Secondary | ICD-10-CM

## 2016-12-17 DIAGNOSIS — M159 Polyosteoarthritis, unspecified: Secondary | ICD-10-CM

## 2016-12-17 LAB — CBC
HCT: 36.1 % (ref 35.0–45.0)
Hemoglobin: 11.9 g/dL (ref 11.7–15.5)
MCH: 30.8 pg (ref 27.0–33.0)
MCHC: 33 g/dL (ref 32.0–36.0)
MCV: 93.5 fL (ref 80.0–100.0)
MPV: 10.3 fL (ref 7.5–12.5)
PLATELETS: 361 10*3/uL (ref 140–400)
RBC: 3.86 MIL/uL (ref 3.80–5.10)
RDW: 13 % (ref 11.0–15.0)
WBC: 4.4 10*3/uL (ref 4.0–10.5)

## 2016-12-17 LAB — COMPREHENSIVE METABOLIC PANEL
ALT: 13 U/L (ref 6–29)
AST: 22 U/L (ref 10–35)
Albumin: 4.1 g/dL (ref 3.6–5.1)
Alkaline Phosphatase: 59 U/L (ref 33–130)
BILIRUBIN TOTAL: 0.5 mg/dL (ref 0.2–1.2)
BUN: 12 mg/dL (ref 7–25)
CHLORIDE: 99 mmol/L (ref 98–110)
CO2: 26 mmol/L (ref 20–31)
Calcium: 9.7 mg/dL (ref 8.6–10.4)
Creat: 0.65 mg/dL (ref 0.50–0.99)
Glucose, Bld: 113 mg/dL — ABNORMAL HIGH (ref 65–99)
Potassium: 4.1 mmol/L (ref 3.5–5.3)
SODIUM: 137 mmol/L (ref 135–146)
Total Protein: 7.3 g/dL (ref 6.1–8.1)

## 2016-12-17 LAB — LIPID PANEL
CHOLESTEROL: 178 mg/dL (ref <200)
HDL: 56 mg/dL (ref >50)
LDL CALC: 106 mg/dL — AB (ref <100)
TRIGLYCERIDES: 78 mg/dL (ref <150)
Total CHOL/HDL Ratio: 3.2 Ratio (ref <5.0)
VLDL: 16 mg/dL (ref <30)

## 2016-12-17 LAB — TSH: TSH: 2.16 mIU/L

## 2016-12-17 NOTE — Progress Notes (Signed)
Subjective:    Nancy Castro is a 66 y.o. female who presents for med check  Chief Complaint  Patient presents with  . med check    med check ,needs refill on meds    Current Health Care Team:  Sees Dentist  Sees Eye doctor  Dr. Ellouise Newer, neurology  Dr. Verl Blalock, GI  D. Kirk Ruths, cardiology 2007 [address] [place], [place] here for primary care  Exercise Current exercise habits: mowing the yard, walking, exerciser machine in house   Nutrition/Diet Current diet: in general, a "healthy" diet  , eats biggest meal in the morning, juices, avoids salt  Dyslipidemia - She is compliant with Lipitor 40mg  1/2 tablet daily (20mg ) and niacin 250mg  2 tablets daily without c/o  HTN - She is compliant with losartan 50mg  daily without c/o  She takes ASA 81mg  daily, Vit D 1000 IU daily  History reviewed: allergies, current medications, past family history, past medical history, past social history, past surgical history and problem list  Past Medical History:  Diagnosis Date  . DDD (degenerative disc disease), cervical   . H/O echocardiogram 2007  . H/O mammogram 2013  . History of exercise stress test 2007  . Hypertension   . Mixed dyslipidemia   . Osteoarthritis   . Syncope 2012   cardiac stress test   . Wears glasses   . Wears partial dentures    upper and lower   Current Outpatient Prescriptions on File Prior to Visit  Medication Sig Dispense Refill  . aspirin 81 MG tablet Take 1 tablet (81 mg total) by mouth daily. 90 tablet 3  . atorvastatin (LIPITOR) 40 MG tablet Take 1 tablet (40 mg total) by mouth daily. (Patient taking differently: Take 20 mg by mouth daily. ) 90 tablet 3  . cyclobenzaprine (FLEXERIL) 5 MG tablet Take 1 tablet (5 mg total) by mouth at bedtime as needed for muscle spasms. 30 tablet 1  . losartan (COZAAR) 50 MG tablet Take 1 tablet (50 mg total) by mouth daily. 90 tablet 3  . meloxicam (MOBIC) 7.5 MG tablet Take 1 tablet (7.5 mg  total) by mouth at bedtime. 30 tablet 2  . MULTIPLE VITAMIN PO Take by mouth daily.    . niacin 250 MG tablet Take 1 tablet (250 mg total) by mouth at bedtime. 2 tablets OTC daily 180 tablet 3  . Vitamin D, Cholecalciferol, 400 UNITS TABS Take 2 tablets by mouth.     No current facility-administered medications on file prior to visit.     Objective: BP 140/80   Pulse 85   Wt 149 lb 9.6 oz (67.9 kg)   SpO2 97%   BMI 28.27 kg/m   Gen: wd, wn, nad Neck: supple, no lymphadenopathy, no thyromegaly, no masses, no bruits Heart: RRR, normal S1, S2, no murmurs Lungs: CTA bilaterally, no wheezes, rhonchi, or rales Extremities: no edema, no cyanosis, no clubbing Pulses: 2+ symmetric, upper and lower extremities, normal cap refill Neurological: alert, oriented x 3, CN2-12 intact, strength normal upper extremities and lower extremities, sensation normal throughout, DTRs 2+ throughout, no cerebellar signs, gait normal Psychiatric: normal affect, behavior normal, pleasant    Assessment:   Encounter Diagnoses  Name Primary?  . Essential hypertension Yes  . Primary osteoarthritis involving multiple joints   . Edema, unspecified type   . Mixed dyslipidemia   . Vitamin D deficiency   . Postmenopausal   . History of fall   . Estrogen deficiency  Plan:   Hypertension - continue your Losartan 50mg  daily  Dyslipidemia - continue Aspirin 81mg  daily, Niacin 250mg , 2 tablets daily, Lipitor 20mg  daily at bedtime  Continue Vitamin D OTC daily  Recommendations  Call your insurance about coverage for the following  Bone density scan - to evaluated for post menopausal, estrogen deficiency, vitamin D deficiency, history of fall  Pneumococcal 13 vaccine  Shingles vaccine  Td/tetanus diptheria vaccine  She is due for repeat colonoscopy at this time.  She will work on finding someone to take her.  Transportation is the issue.

## 2016-12-17 NOTE — Patient Instructions (Signed)
Recommendations  Call your insurance about coverage for the following  Bone density scan - to evaluated for post menopausal, estrogen deficiency, vitamin D deficiency, history of fall  Pneumococcal 13 vaccine  Shingles vaccine  Td/tetanus diptheria vaccine   Bone Densitometry Introduction Bone densitometry is an imaging test that uses a special X-ray to measure the amount of calcium and other minerals in your bones (bone density). This test is also known as a bone mineral density test or dual-energy X-ray absorptiometry (DXA). The test can measure bone density at your hip and your spine. It is similar to having a regular X-ray. You may have this test to:  Diagnose a condition that causes weak or thin bones (osteoporosis).  Predict your risk of a broken bone (fracture).  Determine how well osteoporosis treatment is working. Tell a health care provider about:  Any allergies you have.  All medicines you are taking, including vitamins, herbs, eye drops, creams, and over-the-counter medicines.  Any problems you or family members have had with anesthetic medicines.  Any blood disorders you have.  Any surgeries you have had.  Any medical conditions you have.  Possibility of pregnancy.  Any other medical test you had within the previous 14 days that used contrast material. What are the risks? Generally, this is a safe procedure. However, problems can occur and may include the following:  This test exposes you to a very small amount of radiation.  The risks of radiation exposure may be greater to unborn children. What happens before the procedure?  Do not take any calcium supplements for 24 hours before having the test. You can otherwise eat and drink what you usually do.  Take off all metal jewelry, eyeglasses, dental appliances, and any other metal objects. What happens during the procedure?  You may lie on an exam table. There will be an X-ray generator below you and an  imaging device above you.  Other devices, such as boxes or braces, may be used to position your body properly for the scan.  You will need to lie still while the machine slowly scans your body.  The images will show up on a computer monitor. What happens after the procedure? You may need more testing at a later time. This information is not intended to replace advice given to you by your health care provider. Make sure you discuss any questions you have with your health care provider. Document Released: 11/10/2004 Document Revised: 03/26/2016 Document Reviewed: 03/29/2014  2017 Elsevier

## 2016-12-18 ENCOUNTER — Other Ambulatory Visit: Payer: Self-pay | Admitting: Medical

## 2016-12-18 DIAGNOSIS — E782 Mixed hyperlipidemia: Secondary | ICD-10-CM

## 2016-12-18 DIAGNOSIS — I10 Essential (primary) hypertension: Secondary | ICD-10-CM

## 2016-12-18 LAB — VITAMIN D 25 HYDROXY (VIT D DEFICIENCY, FRACTURES): Vit D, 25-Hydroxy: 36 ng/mL (ref 30–100)

## 2016-12-18 MED ORDER — ASPIRIN 81 MG PO TABS: 81.0000 mg | ORAL_TABLET | Freq: Every day | ORAL | 3 refills | Status: DC

## 2016-12-18 MED ORDER — ATORVASTATIN CALCIUM 20 MG PO TABS: 20.0000 mg | ORAL_TABLET | Freq: Every day | ORAL | 1 refills | Status: DC

## 2016-12-18 MED ORDER — LOSARTAN POTASSIUM 50 MG PO TABS: 50.0000 mg | ORAL_TABLET | Freq: Every day | ORAL | 3 refills | Status: DC

## 2016-12-18 MED ORDER — NIACIN 250 MG PO TABS: 250.0000 mg | ORAL_TABLET | Freq: Every day | ORAL | 3 refills | Status: DC

## 2016-12-18 NOTE — Telephone Encounter (Signed)
Is this this okay to refill  ?

## 2016-12-19 LAB — HEMOGLOBIN A1C
Hgb A1c MFr Bld: 5 % (ref <5.7)
Mean Plasma Glucose: 97 mg/dL

## 2017-02-11 ENCOUNTER — Other Ambulatory Visit: Payer: Self-pay | Admitting: Medical

## 2017-02-11 NOTE — Telephone Encounter (Signed)
Is this okay to refill? 

## 2017-03-08 ENCOUNTER — Other Ambulatory Visit: Payer: Self-pay | Admitting: Medical

## 2017-03-08 DIAGNOSIS — E782 Mixed hyperlipidemia: Secondary | ICD-10-CM

## 2017-03-17 ENCOUNTER — Other Ambulatory Visit: Payer: Self-pay | Admitting: Medical

## 2017-03-18 NOTE — Telephone Encounter (Signed)
Can this pt have a refill on this ? 

## 2017-04-19 ENCOUNTER — Other Ambulatory Visit: Payer: Self-pay | Admitting: Medical

## 2017-04-19 NOTE — Telephone Encounter (Signed)
Can she have a refill on this 

## 2017-04-23 ENCOUNTER — Other Ambulatory Visit: Payer: Self-pay | Admitting: Medical

## 2017-04-23 NOTE — Telephone Encounter (Signed)
Is this okay to refill? 

## 2017-04-26 ENCOUNTER — Telehealth: Payer: Self-pay | Admitting: Medical

## 2017-04-26 ENCOUNTER — Other Ambulatory Visit: Payer: Self-pay | Admitting: Medical

## 2017-04-26 MED ORDER — CYCLOBENZAPRINE HCL 5 MG PO TABS: ORAL_TABLET | ORAL | 2 refills | Status: DC

## 2017-04-26 NOTE — Telephone Encounter (Signed)
Pt called for refill on her Flexeril, it was sent to Dr. Redmond School by mistake and denied.  She states was denied at pharmacy and didn't know why,  She has been on this medication since 2013 and takes as needed for osteoarthritis and hand pain.  She would like a refill.

## 2017-04-26 NOTE — Telephone Encounter (Signed)
rx sent

## 2017-05-25 ENCOUNTER — Other Ambulatory Visit: Payer: Self-pay | Admitting: Medical

## 2017-05-25 NOTE — Telephone Encounter (Signed)
Can this pt have a refill on this ? 

## 2017-06-14 ENCOUNTER — Other Ambulatory Visit: Payer: Self-pay | Admitting: Medical

## 2017-06-14 DIAGNOSIS — E782 Mixed hyperlipidemia: Secondary | ICD-10-CM

## 2017-06-29 ENCOUNTER — Other Ambulatory Visit: Payer: Self-pay | Admitting: Medical

## 2017-06-30 NOTE — Telephone Encounter (Signed)
Can pt have a refill on this 

## 2017-07-28 ENCOUNTER — Other Ambulatory Visit: Payer: Self-pay | Admitting: Medical

## 2017-07-28 NOTE — Telephone Encounter (Signed)
Can pt have a refill on this 

## 2017-07-29 NOTE — Telephone Encounter (Signed)
Send 12mo supply, get in for physical.  Last visit I had recommended she call insurance about vaccines.  Make sure she does this before the visit

## 2017-08-04 ENCOUNTER — Other Ambulatory Visit: Payer: Self-pay | Admitting: Medical

## 2017-08-04 DIAGNOSIS — Z1231 Encounter for screening mammogram for malignant neoplasm of breast: Secondary | ICD-10-CM

## 2017-08-25 ENCOUNTER — Encounter: Payer: Self-pay | Admitting: Medical

## 2017-08-25 ENCOUNTER — Ambulatory Visit (INDEPENDENT_AMBULATORY_CARE_PROVIDER_SITE_OTHER): Payer: Medicare Other | Admitting: Medical

## 2017-08-25 VITALS — BP 122/80 | HR 72 | Ht 60.5 in | Wt 145.2 lb

## 2017-08-25 DIAGNOSIS — E2839 Other primary ovarian failure: Secondary | ICD-10-CM

## 2017-08-25 DIAGNOSIS — I10 Essential (primary) hypertension: Secondary | ICD-10-CM

## 2017-08-25 DIAGNOSIS — M1711 Unilateral primary osteoarthritis, right knee: Secondary | ICD-10-CM | POA: Diagnosis not present

## 2017-08-25 DIAGNOSIS — M25561 Pain in right knee: Secondary | ICD-10-CM | POA: Diagnosis not present

## 2017-08-25 DIAGNOSIS — M8949 Other hypertrophic osteoarthropathy, multiple sites: Secondary | ICD-10-CM

## 2017-08-25 DIAGNOSIS — E559 Vitamin D deficiency, unspecified: Secondary | ICD-10-CM | POA: Diagnosis not present

## 2017-08-25 DIAGNOSIS — M159 Polyosteoarthritis, unspecified: Secondary | ICD-10-CM

## 2017-08-25 DIAGNOSIS — G3184 Mild cognitive impairment, so stated: Secondary | ICD-10-CM

## 2017-08-25 DIAGNOSIS — Z0000 Encounter for general adult medical examination without abnormal findings: Secondary | ICD-10-CM | POA: Diagnosis not present

## 2017-08-25 DIAGNOSIS — Z1239 Encounter for other screening for malignant neoplasm of breast: Secondary | ICD-10-CM

## 2017-08-25 DIAGNOSIS — Z2821 Immunization not carried out because of patient refusal: Secondary | ICD-10-CM | POA: Insufficient documentation

## 2017-08-25 DIAGNOSIS — Z23 Encounter for immunization: Secondary | ICD-10-CM | POA: Diagnosis not present

## 2017-08-25 DIAGNOSIS — E782 Mixed hyperlipidemia: Secondary | ICD-10-CM | POA: Diagnosis not present

## 2017-08-25 DIAGNOSIS — Z780 Asymptomatic menopausal state: Secondary | ICD-10-CM

## 2017-08-25 DIAGNOSIS — G8929 Other chronic pain: Secondary | ICD-10-CM

## 2017-08-25 DIAGNOSIS — Z1231 Encounter for screening mammogram for malignant neoplasm of breast: Secondary | ICD-10-CM

## 2017-08-25 DIAGNOSIS — D649 Anemia, unspecified: Secondary | ICD-10-CM | POA: Insufficient documentation

## 2017-08-25 DIAGNOSIS — Z1211 Encounter for screening for malignant neoplasm of colon: Secondary | ICD-10-CM | POA: Diagnosis not present

## 2017-08-25 DIAGNOSIS — M150 Primary generalized (osteo)arthritis: Secondary | ICD-10-CM

## 2017-08-25 LAB — POCT URINALYSIS DIP (PROADVANTAGE DEVICE)
Bilirubin, UA: NEGATIVE
Blood, UA: NEGATIVE
Glucose, UA: NEGATIVE mg/dL
Ketones, POC UA: NEGATIVE mg/dL
Nitrite, UA: NEGATIVE
PH UA: 5.5 (ref 5.0–8.0)
PROTEIN UA: NEGATIVE mg/dL
Specific Gravity, Urine: 1.03
Urobilinogen, Ur: NEGATIVE

## 2017-08-25 NOTE — Progress Notes (Signed)
Subjective:    Nancy Castro is a 66 y.o. female who presents for Preventative Services visit and chronic medical problems/med check visit. Chief Complaint  Patient presents with  . fasting cpe    fasting cpe, no other concerns, eye appt scheduled. does not see any other doctors    Primary Care Provider Camary Sosa, Camelia Eng, PA-C here for primary care  Current Health Care Team:  Sees Dentist  Sees Eye doctor  Dr. Ellouise Newer, neurology  Dr. Verl Blalock, GI D. Kirk Ruths, cardiology  Divya Munshi, Camelia Eng, PA-C here for primary care  Medical Services you may have received from other than [facility] providers in the past year (date may be approximate) none  Exercise Current exercise habits: mowing the yard, walking, exerciser machine in house   Nutrition/Diet Current diet: in general, a "healthy" diet  , eats biggest meal in the morning, juices, avoids salt  Depression Screen Depression screen [facility] 2/9 08/25/2017  Decreased Interest 0  Down, Depressed, Hopeless 0  PHQ - 2 Score 0    Activities of Daily Living Screen/Functional Status Survey Is the patient deaf or have difficulty hearing?: No Does the patient have difficulty seeing, even when wearing glasses/contacts?: No Does the patient have difficulty concentrating, remembering, or making decisions?: No Does the patient have difficulty walking or climbing stairs?: No Does the patient have difficulty dressing or bathing?: No Does the patient have difficulty doing errands alone such as visiting a doctor's office or shopping?: No  [place]  08/25/2017 02/05/2016 08/07/2015  Falls in the past year? No No No    Gait Assessment: Normal gait observed yes  Advanced directives Does patient have a [place]? Yes Does patient have a Living Will? Yes  Past Medical History:  Diagnosis Date  . DDD (degenerative disc disease), cervical   . H/O echocardiogram 2007  . History of exercise  stress test 2007  . Hypertension   . Mixed dyslipidemia   . Osteoarthritis   . Syncope 2012   cardiac stress test   . Wears glasses   . Wears partial dentures    upper and lower    Past Surgical History:  Procedure Laterality Date  . COLONOSCOPY     2014, polyps, repeat 3 years  . KNEE ARTHROSCOPY     right  . LIPOMA EXCISION     back x 2  . PARTIAL HYSTERECTOMY     fibroids; has both ovaries    Social History   Social History  . Marital status: Married    Spouse name: N/A  . Number of children: N/A  . Years of education: N/A   Occupational History  . Not on file.   Social History Main Topics  . Smoking status: Former Smoker    Packs/day: 0.25    Years: 5.00    Quit date: 02/08/1987  . Smokeless tobacco: Never Used  . Alcohol use No  . Drug use: No  . Sexual activity: Not on file   Other Topics Concern  . 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  No grandchildren.   Sews - hobby.  08/2017    Family History  Problem Relation Age of Onset  . Cancer Mother        died of breast cancer  . Cancer Father  died of lung cancer, mets to brain  . Cancer Sister        lung, mets to brain  . Cancer Brother        lung and stomach  . Hypertension Maternal Grandmother   . Diabetes Brother   . Cancer Brother        lung  . Heart disease Neg Hx   . Stroke Neg Hx   . Colon cancer Neg Hx      Current Outpatient Prescriptions:  .  aspirin 81 MG tablet, Take 1 tablet (81 mg total) by mouth daily., Disp: 90 tablet, Rfl: 3 .  atorvastatin (LIPITOR) 40 MG tablet, TAKE 1 TABLET(40 MG) BY MOUTH DAILY, Disp: 90 tablet, Rfl: 0 .  losartan (COZAAR) 50 MG tablet, Take 1 tablet (50 mg total) by mouth daily., Disp: 90 tablet, Rfl: 3 .  MULTIPLE VITAMIN PO, Take by mouth daily., Disp: , Rfl:  .  niacin 250 MG tablet, Take 1 tablet (250 mg total) by mouth  at bedtime. 2 tablets OTC daily, Disp: 180 tablet, Rfl: 3 .  Vitamin D, Cholecalciferol, 400 UNITS TABS, Take 2 tablets by mouth., Disp: , Rfl:   No Known Allergies  History reviewed: allergies, current medications, past family history, past medical history, past social history, past surgical history and problem list  Chronic issues discussed: Dyslipidemia - She is compliant with Lipitor 40mg  daily and niacin 250mg  2 tablets daily without c/o  HTN - She is compliant with losartan 50mg  daily without c/o  Having regular pains in joint pains in hands, worse right, and pains and swelling in right knee, bony changes in right kne.   Had surgery in past at [facility] to clean out knee  Acute issues discussed: none  Objective:      Biometrics BP 122/80   Pulse 72   Ht 5' 0.5" (1.537 m)   Wt 145 lb 3.2 oz (65.9 kg)   BMI 27.89 kg/m   Wt Readings from Last 3 Encounters:  08/25/17 145 lb 3.2 oz (65.9 kg)  12/17/16 149 lb 9.6 oz (67.9 kg)  08/20/16 154 lb 3.2 oz (69.9 kg)    Cognitive Testing  Alert? Yes  Normal Appearance?Yes  Oriented to person? Yes  Place? Yes   Time? Yes  Recall of three objects?  Yes  Can perform simple calculations? Yes  Displays appropriate judgment?Yes  Can read the correct time from a watch face?Yes  General appearance: alert, no distress, WD/WN, AA  female  Nutritional Status: Inadequate calore intake? no Loss of muscle mass? no Loss of fat beneath skin? no Localized or general edema? no Diminished functional status? no  Other pertinent exam: HEENT: normocephalic, sclerae anicteric, TMs pearly, nares patent, no discharge or erythema, pharynx normal Oral cavity: MMM, no lesions, dentures present upper and lower Neck: supple, no lymphadenopathy, no thyromegaly, no masses, no bruits Heart: RRR, normal S1, S2, no murmurs Lungs: CTA bilaterally, no wheezes, rhonchi, or rales Abdomen: +bs, soft, non tender, non distended, no masses, no  hepatomegaly, no splenomegaly, no bruits Back: nontender, mid right back short surgical scars from prior lipoma excision Musculoskeletal: right knee with anterior surgical scars, right knee with bony arthritis changes and superior medial portion of tibia with scooped out deformity from prior surgery, right hand 2nd and 3rd MCP with bony arthritic changes, otherwise UE and LE nontender, no swelling, no obvious deformity Extremities: no edema, no cyanosis, no clubbing Pulses: 2+ symmetric, upper and lower extremities, normal cap refill  Neurological: alert, oriented x 3, CN2-12 intact, strength normal upper extremities and lower extremities, sensation normal throughout, DTRs 2+ throughout, no cerebellar signs, gait normal Psychiatric: normal affect, behavior normal, pleasant  Declined breast/gyn exam    Assessment:   Encounter Diagnoses  Name Primary?  . Routine general medical examination at a health care facility Yes  . Essential hypertension   . Mixed dyslipidemia   . Mild cognitive impairment   . Primary osteoarthritis involving multiple joints   . Estrogen deficiency   . Vitamin D deficiency   . Postmenopausal   . Need for prophylactic vaccination against Streptococcus pneumoniae (pneumococcus)   . Screening for breast cancer   . Chronic pain of right knee   . Arthritis of right knee   . Screen for colon cancer   . Anemia, unspecified type   . Pneumococcal vaccination declined      Plan:   A preventative services visit was completed today.  During the course of the visit today, we discussed and counseled about appropriate screening and preventive services.  A health risk assessment was established today that included a review of current medications, allergies, social history, family history, medical and preventative health history, biometrics, and preventative screenings to identify potential safety concerns or impairments.  A personalized plan was printed today for your  records and use.   Personalized health advice and education was given today to reduce health risks and promote self management and wellness.  Information regarding end of life planning was discussed today.  Conditions/risks identified: Arthritis pain of right and and right knee.  Will look into compounded cream for topical use.  Stop mobic and flexeril  Chronic problems discussed today: Hypertension - continue your Losartan 50mg  daily  Dyslipidemia - continue Aspirin 81mg  daily, Niacin 250mg , 2 tablets daily, Lipitor 40mg  daily at bedtime  Continue Vitamin D OTC daily  Acute problems discussed today: none  Recommendations:  I recommend a yearly ophthalmology/optometry visit for glaucoma screening and eye checkup  I recommended a yearly dental visit for hygiene and checkup  Advanced directives - discussed nature and purpose of Advanced Directives, encouraged them to complete them if they have not done so and/or encouraged them to get [place] a copy if they have done this already.  I recommend a screening mammogram every year  Due now for colonoscopy.  She wants to call back with time to schedule.  She has to secure a ride.    Your last bone density screen was ?  I recommend you have a bone density evaluation   Referrals today: Bone Density Scan Xray right knee, right hand   Immunizations: I recommended a yearly influenza vaccine, typically in September when the vaccine is usually available Is the Pneumococcal vaccine up to date: yes on pneumococcal 23, but declines Prevnar 13 and shingles vaccine. Is the Td/Tdap vaccine up to date: she reports < 10 years ago.     Jacobi was seen today for fasting cpe.  Diagnoses and all orders for this visit:  Routine general medical examination at a health care facility -     POCT Urinalysis DIP (Proadvantage Device)  Essential hypertension -     VITAMIN D 25 Hydroxy (Vit-D Deficiency, Fractures) -     Lipid panel -     CBC -      Comprehensive metabolic panel  Mixed dyslipidemia -     Lipid panel -     CBC -     Comprehensive metabolic panel  Mild  cognitive impairment -     VITAMIN D 25 Hydroxy (Vit-D Deficiency, Fractures) -     Lipid panel -     CBC -     Comprehensive metabolic panel -     Iron  Primary osteoarthritis involving multiple joints -     DG Hand Complete Right; Future  Estrogen deficiency -     DG Bone Density; Future  Vitamin D deficiency -     VITAMIN D 25 Hydroxy (Vit-D Deficiency, Fractures) -     DG Bone Density; Future -     Iron  Postmenopausal -     VITAMIN D 25 Hydroxy (Vit-D Deficiency, Fractures) -     DG Bone Density; Future  Need for prophylactic vaccination against Streptococcus pneumoniae (pneumococcus)  Screening for breast cancer  Chronic pain of right knee -     DG Knee Complete 4 Views Right; Future  Arthritis of right knee -     DG Knee Complete 4 Views Right; Future  Screen for colon cancer -     CBC  Anemia, unspecified type -     CBC -     Comprehensive metabolic panel -     Iron  Pneumococcal vaccination declined      Medicare Attestation A preventative services visit was completed today.  During the course of the visit the patient was educated and counseled about appropriate screening and preventive services.  A health risk assessment was established with the patient that included a review of current medications, allergies, social history, family history, medical and preventative health history, biometrics, and preventative screenings to identify potential safety concerns or impairments.  A personalized plan was printed today for the patient's records and use.   Personalized health advice and education was given today to reduce health risks and promote self management and wellness.  Information regarding end of life planning was discussed today.  Crisoforo Oxford, PA-C   08/25/2017

## 2017-08-25 NOTE — Progress Notes (Signed)
   Subjective:    Patient ID: Nancy Castro, female    DOB: [birth year], 66 y.o.   MRN: [id number]  HPI Chief Complaint  Patient presents with  . fasting cpe    fasting cpe, no other concerns, eye appt scheduled. does not see any other doctors   She is here for a breast and pelvic exam. She was seen by her PCP, Audelia Acton, within the past month for complete physical exam and preferred that a female do her breast and pelvic exams.  She had a hysterectomy in 1985 for fibroids.  States she has some vaginal dryness and has been using over the counter monistat for this. She also reports taking tub baths with bath salts. Denies vaginal discharge, itching, or irritation.  Denies fever, chills, urinary symptoms.  Is not sexually active.  Denies need for STI testing today  Last mammogram: 06/2015   Review of Systems Pertinent positives and negatives in the history of present illness.     Objective:   Physical Exam  Pulmonary/Chest: Right breast exhibits no mass, no skin change and no tenderness. Left breast exhibits no mass, no skin change and no tenderness.  Normal breast exam.   Genitourinary: There is no tenderness or lesion on the right labia. There is no tenderness or lesion on the left labia. Right adnexum displays no mass, no tenderness and no fullness. Left adnexum displays no mass, no tenderness and no fullness.  Genitourinary Comments: Vagina with dryness and mild atrophy. No discharge or tenderness.    BP 122/80   Pulse 72   Ht 5' 0.5" (1.537 m)   Wt 145 lb 3.2 oz (65.9 kg)   BMI 27.89 kg/m       Assessment & Plan:  Routine general medical examination at a health care facility - Plan: POCT Urinalysis DIP (Proadvantage Device)  Discussed that she does appear to have some dryness and minimal atrophy to vagina. Recommend that she avoid monistat or any other chemicals or harsh soaps to the area and avoid tub baths for a few weeks. Discussed taking showers and using Dove or mild  soap. Recommend giving these interventions some time and seeing if she notices any improvement in vaginal dryness and if not then she will follow up for possible topical estrogen therapy. Follow up with Audelia Acton, PCP as recommended.

## 2017-08-25 NOTE — Progress Notes (Signed)
   Subjective:    Patient ID: Nancy Castro, female    DOB: 1951-07-18, 66 y.o.   MRN: [id number]  HPI    Review of Systems     Objective:   Physical Exam        Assessment & Plan:

## 2017-08-26 ENCOUNTER — Other Ambulatory Visit: Payer: Self-pay

## 2017-08-26 LAB — CBC
HCT: 36.9 % (ref 35.0–45.0)
Hemoglobin: 12.4 g/dL (ref 11.7–15.5)
MCH: 31.6 pg (ref 27.0–33.0)
MCHC: 33.6 g/dL (ref 32.0–36.0)
MCV: 94.1 fL (ref 80.0–100.0)
MPV: 11.8 fL (ref 7.5–12.5)
PLATELETS: 243 10*3/uL (ref 140–400)
RBC: 3.92 10*6/uL (ref 3.80–5.10)
RDW: 12.4 % (ref 11.0–15.0)
WBC: 3.8 10*3/uL (ref 3.8–10.8)

## 2017-08-26 LAB — IRON: IRON: 105 ug/dL (ref 45–160)

## 2017-08-26 LAB — COMPREHENSIVE METABOLIC PANEL
AG Ratio: 1.8 (calc) (ref 1.0–2.5)
ALKALINE PHOSPHATASE (APISO): 62 U/L (ref 33–130)
ALT: 16 U/L (ref 6–29)
AST: 21 U/L (ref 10–35)
Albumin: 4.6 g/dL (ref 3.6–5.1)
BILIRUBIN TOTAL: 0.7 mg/dL (ref 0.2–1.2)
BUN: 11 mg/dL (ref 7–25)
CALCIUM: 9.9 mg/dL (ref 8.6–10.4)
CHLORIDE: 104 mmol/L (ref 98–110)
CO2: 28 mmol/L (ref 20–32)
CREATININE: 0.62 mg/dL (ref 0.50–0.99)
Globulin: 2.5 g/dL (calc) (ref 1.9–3.7)
Glucose, Bld: 102 mg/dL — ABNORMAL HIGH (ref 65–99)
POTASSIUM: 4.3 mmol/L (ref 3.5–5.3)
SODIUM: 139 mmol/L (ref 135–146)
TOTAL PROTEIN: 7.1 g/dL (ref 6.1–8.1)

## 2017-08-26 LAB — LIPID PANEL
CHOL/HDL RATIO: 2 (calc) (ref <5.0)
CHOLESTEROL: 195 mg/dL (ref <200)
HDL: 99 mg/dL (ref >50)
LDL Cholesterol (Calc): 84 mg/dL (calc)
Non-HDL Cholesterol (Calc): 96 mg/dL (calc) (ref <130)
Triglycerides: 43 mg/dL (ref <150)

## 2017-08-26 LAB — VITAMIN D 25 HYDROXY (VIT D DEFICIENCY, FRACTURES): Vit D, 25-Hydroxy: 35 ng/mL (ref 30–100)

## 2017-08-31 ENCOUNTER — Ambulatory Visit
Admission: RE | Admit: 2017-08-31 | Discharge: 2017-08-31 | Disposition: A | Discharge status: Home / Self Care | Payer: Medicare Other | Source: Ambulatory Visit | Attending: Medical | Admitting: Medical

## 2017-08-31 DIAGNOSIS — Z1231 Encounter for screening mammogram for malignant neoplasm of breast: Secondary | ICD-10-CM | POA: Diagnosis not present

## 2017-09-06 ENCOUNTER — Telehealth: Payer: Self-pay | Admitting: Medical

## 2017-09-06 NOTE — Telephone Encounter (Signed)
Dtr Meredith Mody (she is on HIPPA)  For information regarding the new compounding cream for pt's arthritis, spoke with Louretta Shorten and gave T# [phone] for [facility] to call for info

## 2017-09-06 NOTE — Telephone Encounter (Signed)
Wow, that is expensive.  She can ask them if they do a cheaper option such as $30/mo.  If not, have her try one of the following OTC creams for arthritis: Capsacian Aspercreme Horse Lentiment

## 2017-09-06 NOTE — Telephone Encounter (Signed)
Daughter called back & compounding cream is $124 for 60 grams which is only 2 ounces which pharmacist said would only last 2 weeks.  She wants to know if there are any other options available that are less expensive ?

## 2017-09-07 NOTE — Telephone Encounter (Signed)
Called and spoke with pt gave her the options

## 2017-09-08 ENCOUNTER — Other Ambulatory Visit: Payer: Self-pay | Admitting: Medical

## 2017-09-08 DIAGNOSIS — E782 Mixed hyperlipidemia: Secondary | ICD-10-CM

## 2017-11-12 ENCOUNTER — Ambulatory Visit
Admission: RE | Admit: 2017-11-12 | Discharge: 2017-11-12 | Disposition: A | Discharge status: Home / Self Care | Payer: Medicare HMO | Source: Ambulatory Visit | Attending: Medical | Admitting: Medical

## 2017-11-12 DIAGNOSIS — M159 Polyosteoarthritis, unspecified: Secondary | ICD-10-CM

## 2017-11-12 DIAGNOSIS — G8929 Other chronic pain: Secondary | ICD-10-CM

## 2017-11-12 DIAGNOSIS — M25561 Pain in right knee: Principal | ICD-10-CM

## 2017-11-12 DIAGNOSIS — M8949 Other hypertrophic osteoarthropathy, multiple sites: Secondary | ICD-10-CM

## 2017-11-12 DIAGNOSIS — M179 Osteoarthritis of knee, unspecified: Secondary | ICD-10-CM | POA: Diagnosis not present

## 2017-11-12 DIAGNOSIS — M150 Primary generalized (osteo)arthritis: Secondary | ICD-10-CM

## 2017-11-12 DIAGNOSIS — M19041 Primary osteoarthritis, right hand: Secondary | ICD-10-CM | POA: Diagnosis not present

## 2017-11-12 DIAGNOSIS — M1711 Unilateral primary osteoarthritis, right knee: Secondary | ICD-10-CM

## 2017-12-08 ENCOUNTER — Encounter: Payer: Self-pay | Admitting: Medical

## 2017-12-08 ENCOUNTER — Ambulatory Visit (INDEPENDENT_AMBULATORY_CARE_PROVIDER_SITE_OTHER): Payer: Medicare HMO | Admitting: Medical

## 2017-12-08 VITALS — BP 110/68 | HR 80 | Wt 143.4 lb

## 2017-12-08 DIAGNOSIS — M150 Primary generalized (osteo)arthritis: Secondary | ICD-10-CM

## 2017-12-08 DIAGNOSIS — E2839 Other primary ovarian failure: Secondary | ICD-10-CM

## 2017-12-08 DIAGNOSIS — M1711 Unilateral primary osteoarthritis, right knee: Secondary | ICD-10-CM

## 2017-12-08 DIAGNOSIS — E782 Mixed hyperlipidemia: Secondary | ICD-10-CM | POA: Diagnosis not present

## 2017-12-08 DIAGNOSIS — M25561 Pain in right knee: Secondary | ICD-10-CM

## 2017-12-08 DIAGNOSIS — R7309 Other abnormal glucose: Secondary | ICD-10-CM

## 2017-12-08 DIAGNOSIS — G3184 Mild cognitive impairment, so stated: Secondary | ICD-10-CM | POA: Diagnosis not present

## 2017-12-08 DIAGNOSIS — M8949 Other hypertrophic osteoarthropathy, multiple sites: Secondary | ICD-10-CM

## 2017-12-08 DIAGNOSIS — E559 Vitamin D deficiency, unspecified: Secondary | ICD-10-CM | POA: Diagnosis not present

## 2017-12-08 DIAGNOSIS — Z1211 Encounter for screening for malignant neoplasm of colon: Secondary | ICD-10-CM

## 2017-12-08 DIAGNOSIS — M159 Polyosteoarthritis, unspecified: Secondary | ICD-10-CM

## 2017-12-08 DIAGNOSIS — G8929 Other chronic pain: Secondary | ICD-10-CM

## 2017-12-08 DIAGNOSIS — Z780 Asymptomatic menopausal state: Secondary | ICD-10-CM

## 2017-12-08 DIAGNOSIS — I10 Essential (primary) hypertension: Secondary | ICD-10-CM | POA: Diagnosis not present

## 2017-12-08 LAB — POCT GLYCOSYLATED HEMOGLOBIN (HGB A1C): HEMOGLOBIN A1C: 5.3

## 2017-12-08 MED ORDER — LOSARTAN POTASSIUM 50 MG PO TABS: 50.0000 mg | ORAL_TABLET | Freq: Every day | ORAL | 3 refills | Status: DC

## 2017-12-08 MED ORDER — ATORVASTATIN CALCIUM 40 MG PO TABS: ORAL_TABLET | ORAL | 3 refills | Status: DC

## 2017-12-08 MED ORDER — VITAMIN D (CHOLECALCIFEROL) 10 MCG (400 UNIT) PO TABS: 1.0000 | ORAL_TABLET | Freq: Two times a day (BID) | ORAL | 11 refills | Status: DC

## 2017-12-08 MED ORDER — ASPIRIN 81 MG PO TABS: 81.0000 mg | ORAL_TABLET | Freq: Every day | ORAL | 3 refills | Status: DC

## 2017-12-08 MED ORDER — NIACIN 250 MG PO TABS: 250.0000 mg | ORAL_TABLET | Freq: Every day | ORAL | 3 refills | Status: DC

## 2017-12-08 NOTE — Progress Notes (Signed)
Subjective: Chief Complaint  Patient presents with  . med check    med check , no other cocerns    Here for med check.  Last visit glucose was a little elevated.  Working to eat healthy, doing smoothies with kale and fresh fruit.  Hyperlipidemia-compliant with Lipitor 40 mg daily, aspirin 81 mg daily, niacin 250 mg 2 tablets daily over-the-counter  Hypertension-compliant with losartan 50 mg daily,  Vitamin D -taking over-the-counter vitamin D daily  Last colonoscopy 2014, was due repeat 3 years.    Last visit we recommended bone density scan  OA of hand and knee - using essential oils topically.  Couldn't afford cream at [facility].  Half brother died in 11-04-23, had cancer.     Past Medical History:  Diagnosis Date  . DDD (degenerative disc disease), cervical   . H/O echocardiogram 2007  . History of exercise stress test 2007  . Hypertension   . Mixed dyslipidemia   . Osteoarthritis   . Syncope 2012   cardiac stress test   . Wears glasses   . Wears partial dentures    upper and lower   Current Outpatient Medications on File Prior to Visit  Medication Sig Dispense Refill  . aspirin 81 MG tablet Take 1 tablet (81 mg total) by mouth daily. 90 tablet 3  . atorvastatin (LIPITOR) 40 MG tablet TAKE 1 TABLET(40 MG) BY MOUTH DAILY 90 tablet 0  . losartan (COZAAR) 50 MG tablet Take 1 tablet (50 mg total) by mouth daily. 90 tablet 3  . MULTIPLE VITAMIN PO Take by mouth daily.    . niacin 250 MG tablet Take 1 tablet (250 mg total) by mouth at bedtime. 2 tablets OTC daily 180 tablet 3  . Vitamin D, Cholecalciferol, 400 UNITS TABS Take 2 tablets by mouth.     No current facility-administered medications on file prior to visit.    ROS as in subjective     Objective: BP 110/68   Pulse 80   Wt 143 lb 6.4 oz (65 kg)   SpO2 96%   BMI 27.54 kg/m   General appearance: alert, no distress, WD/WN,  Neck: supple, no lymphadenopathy, no thyromegaly, no masses Heart:  RRR, normal S1, S2, no murmurs Lungs: CTA bilaterally, no wheezes, rhonchi, or rales Ext: no edema Pulses: 2+ symmetric, upper and lower extremities, normal cap refill    Assessment: Encounter Diagnoses  Name Primary?  . Elevated glucose   . Essential hypertension Yes  . Mild cognitive impairment   . Arthritis of right knee   . Primary osteoarthritis involving multiple joints   . Chronic pain of right knee   . Mixed dyslipidemia   . Vitamin D deficiency   . Postmenopausal   . Screen for colon cancer   . Estrogen deficiency      Plan: Elevated glucose last visit-hemoglobin A1c 5.3 % today.  Counseled on diet and exercise  hypertension-at goal, continue current medication  Dyslipidemia-continue current medication, at goal   Vitamin D deficiency-continue get seafood and diet, continue over-the-counter vitamin D, weightbearing exercise   Postmenopausal, estrogen deficiency-reminded her to schedule her bone density scan  Screen for colon cancer- she is due for repeat colonoscopy, plans to call back for referral in a few months.    Arthritis of knee and hand-doing ok with topical essential oils.

## 2017-12-08 NOTE — Patient Instructions (Addendum)
Good news, your diabetes marker is ok.  Call and schedule a bone density test at [facility].  Let me know when you are ready for referral back for colonoscopy  Continue your current medications  Lets see you back in October for physical/medicare well visit.

## 2017-12-08 NOTE — Addendum Note (Signed)
Addended by: Tyrone Apple on: 12/08/2017 09:56 AM   Modules accepted: Orders

## 2018-02-23 DIAGNOSIS — M21161 Varus deformity, not elsewhere classified, right knee: Secondary | ICD-10-CM | POA: Diagnosis not present

## 2018-02-23 DIAGNOSIS — M25561 Pain in right knee: Secondary | ICD-10-CM | POA: Diagnosis not present

## 2018-02-23 DIAGNOSIS — M1711 Unilateral primary osteoarthritis, right knee: Secondary | ICD-10-CM | POA: Diagnosis not present

## 2018-02-23 DIAGNOSIS — R269 Unspecified abnormalities of gait and mobility: Secondary | ICD-10-CM | POA: Diagnosis not present

## 2018-05-04 ENCOUNTER — Encounter: Payer: Self-pay | Admitting: Internal Medicine

## 2018-05-13 LAB — GLUCOSE, POCT (MANUAL RESULT ENTRY): POC Glucose: 125 mg/dl — AB (ref 70–99)

## 2018-06-30 ENCOUNTER — Encounter: Payer: Self-pay | Admitting: Internal Medicine

## 2018-06-30 ENCOUNTER — Ambulatory Visit (AMBULATORY_SURGERY_CENTER): Payer: Self-pay

## 2018-06-30 VITALS — Ht 61.0 in | Wt 138.2 lb

## 2018-06-30 DIAGNOSIS — Z86010 Personal history of colonic polyps: Secondary | ICD-10-CM

## 2018-06-30 MED ORDER — NA SULFATE-K SULFATE-MG SULF 17.5-3.13-1.6 GM/177ML PO SOLN: 1.0000 | Freq: Once | ORAL | 0 refills | Status: AC

## 2018-06-30 NOTE — Progress Notes (Signed)
Per pt, no allergies to soy or egg products.Pt not taking any weight loss meds or using  O2 at home.  Pt refused emmi video. 

## 2018-07-14 ENCOUNTER — Ambulatory Visit (AMBULATORY_SURGERY_CENTER): Payer: Medicare HMO | Admitting: Internal Medicine

## 2018-07-14 ENCOUNTER — Encounter: Payer: Self-pay | Admitting: Internal Medicine

## 2018-07-14 VITALS — BP 133/71 | HR 64 | Temp 97.7°F | Resp 15 | Ht 61.0 in | Wt 138.0 lb

## 2018-07-14 DIAGNOSIS — D120 Benign neoplasm of cecum: Secondary | ICD-10-CM

## 2018-07-14 DIAGNOSIS — Z1211 Encounter for screening for malignant neoplasm of colon: Secondary | ICD-10-CM | POA: Diagnosis not present

## 2018-07-14 DIAGNOSIS — D123 Benign neoplasm of transverse colon: Secondary | ICD-10-CM

## 2018-07-14 DIAGNOSIS — Z86010 Personal history of colonic polyps: Secondary | ICD-10-CM | POA: Diagnosis not present

## 2018-07-14 HISTORY — PX: COLONOSCOPY: SHX174

## 2018-07-14 MED ORDER — SODIUM CHLORIDE 0.9 % IV SOLN: 500.0000 mL | Freq: Once | INTRAVENOUS | Status: DC

## 2018-07-14 NOTE — Progress Notes (Signed)
Per Dr. Hilarie Fredrickson ok to continue taking asa 81 mg daily.  No problems noted in the recovery room. maw

## 2018-07-14 NOTE — Op Note (Signed)
[facility] Patient Name: Nancy Castro Procedure Date: 07/14/2018 11:33 AM MRN: [id number] Endoscopist: Jerene Bears , MD Age: 67 Referring MD:  Date of Birth: [birth year] Gender: Female Account #: [id number] Procedure:                Colonoscopy Indications:              Surveillance: Personal history of adenomatous                            polyps on last colonoscopy 5 years ago Medicines:                Monitored Anesthesia Care Procedure:                Pre-Anesthesia Assessment:                           - Prior to the procedure, a History and Physical                            was performed, and patient medications and                            allergies were reviewed. The patient's tolerance of                            previous anesthesia was also reviewed. The risks                            and benefits of the procedure and the sedation                            options and risks were discussed with the patient.                            All questions were answered, and informed consent                            was obtained. Prior Anticoagulants: The patient has                            taken no previous anticoagulant or antiplatelet                            agents. ASA Grade Assessment: II - A patient with                            mild systemic disease. After reviewing the risks                            and benefits, the patient was deemed in                            satisfactory condition to undergo the procedure.  After obtaining informed consent, the colonoscope                            was passed under direct vision. Throughout the                            procedure, the patient's blood pressure, pulse, and                            oxygen saturations were monitored continuously. The                            Colonoscope was introduced through the anus and                            advanced to the cecum,  identified by appendiceal                            orifice and ileocecal valve. The colonoscopy was                            performed without difficulty. The patient tolerated                            the procedure well. The quality of the bowel                            preparation was good. The ileocecal valve,                            appendiceal orifice, and rectum were photographed. Scope In: 11:50:28 AM Scope Out: 12:11:17 PM Scope Withdrawal Time: 0 hours 16 minutes 50 seconds  Total Procedure Duration: 0 hours 20 minutes 49 seconds  Findings:                 The digital rectal exam was normal.                           A 15 mm polyp was found in the cecum. The polyp was                            pedunculated. The polyp was removed with a hot                            snare. Resection and retrieval were complete (polyp                            was transected after polypectomy to aid in                            retrieval).                           Four sessile polyps were found in the transverse  colon. The polyps were 4 to 6 mm in size. These                            polyps were removed with a cold snare. Resection                            and retrieval were complete.                           There was a medium-sized lipoma, in the transverse                            colon.                           Multiple small and large-mouthed diverticula were                            found in the sigmoid colon, descending colon,                            ascending colon and cecum.                           The retroflexed view of the distal rectum and anal                            verge was normal and showed no anal or rectal                            abnormalities. Complications:            No immediate complications. Estimated Blood Loss:     Estimated blood loss was minimal. Impression:               - One 15 mm polyp in the cecum,  removed with a hot                            snare. Resected and retrieved.                           - Four 4 to 6 mm polyps in the transverse colon,                            removed with a cold snare. Resected and retrieved.                           - Medium-sized lipoma in the transverse colon.                           - Moderate diverticulosis in the sigmoid colon, in                            the descending colon, in the ascending colon and in  the cecum.                           - The distal rectum and anal verge are normal on                            retroflexion view. Recommendation:           - Patient has a contact number available for                            emergencies. The signs and symptoms of potential                            delayed complications were discussed with the                            patient. Return to normal activities tomorrow.                            Written discharge instructions were provided to the                            patient.                           - Resume previous diet.                           - Continue present medications.                           - Await pathology results.                           - No ibuprofen, naproxen, or other non-steroidal                            anti-inflammatory drugs for 3 weeks after polyp                            removal.                           - Repeat colonoscopy date to be determined after                            pending pathology results are reviewed for                            surveillance. Jerene Bears, MD 07/14/2018 12:16:36 PM This report has been signed electronically.

## 2018-07-14 NOTE — Patient Instructions (Signed)
YOU HAD AN ENDOSCOPIC PROCEDURE TODAY AT [facility]:   Refer to the procedure report that was given to you for any specific questions about what was found during the examination.  If the procedure report does not answer your questions, please call your gastroenterologist to clarify.  If you requested that your care partner not be given the details of your procedure findings, then the procedure report has been included in a sealed envelope for you to review at your convenience later.  YOU SHOULD EXPECT: Some feelings of bloating in the abdomen. Passage of more gas than usual.  Walking can help get rid of the air that was put into your GI tract during the procedure and reduce the bloating. If you had a lower endoscopy (such as a colonoscopy or flexible sigmoidoscopy) you may notice spotting of blood in your stool or on the toilet paper. If you underwent a bowel prep for your procedure, you may not have a normal bowel movement for a few days.  Please Note:  You might notice some irritation and congestion in your nose or some drainage.  This is from the oxygen used during your procedure.  There is no need for concern and it should clear up in a day or so.  SYMPTOMS TO REPORT IMMEDIATELY:   Following lower endoscopy (colonoscopy or flexible sigmoidoscopy):  Excessive amounts of blood in the stool  Significant tenderness or worsening of abdominal pains  Swelling of the abdomen that is new, acute  Fever of 100F or higher  For urgent or emergent issues, a gastroenterologist can be reached at any hour by calling [phone].   DIET:  We do recommend a small meal at first, but then you may proceed to your regular diet.  Drink plenty of fluids but you should avoid alcoholic beverages for 24 hours.  ACTIVITY:  You should plan to take it easy for the rest of today and you should NOT DRIVE or use heavy machinery until tomorrow (because of the sedation medicines used during the test).     FOLLOW UP: Our staff will call the number listed on your records the next business day following your procedure to check on you and address any questions or concerns that you may have regarding the information given to you following your procedure. If we do not reach you, we will leave a message.  However, if you are feeling well and you are not experiencing any problems, there is no need to return our call.  We will assume that you have returned to your regular daily activities without incident.  If any biopsies were taken you will be contacted by phone or by letter within the next 1-3 weeks.  Please call us at [phone] if you have not heard about the biopsies in 3 weeks.    SIGNATURES/CONFIDENTIALITY: You and/or your care partner have signed paperwork which will be entered into your electronic medical record.  These signatures attest to the fact that that the information above on your After Visit Summary has been reviewed and is understood.  Full responsibility of the confidentiality of this discharge information lies with you and/or your care-partner.   Handouts were given to your care partner on polyps and diverticulosis. NO ASPIRIN, ASPIRIN CONTAINING PRODUCTS (BC OR GOODY POWDERS) OR NSAIDS (IBUPROFEN, ADVIL, ALEVE, AND MOTRIN) FOR 3 weeks; TYLENOL IS OK TO TAKE You may resume your other current medications today. Await biopsy results. Please call if any questions or concerns.

## 2018-07-14 NOTE — Progress Notes (Signed)
Called to room to assist during endoscopic procedure.  Patient ID and intended procedure confirmed with present staff. Received instructions for my participation in the procedure from the performing physician.  

## 2018-07-14 NOTE — Progress Notes (Signed)
To PACU, VSS. Report to [email or web address] 

## 2018-07-14 NOTE — Progress Notes (Signed)
Pt's states no medical or surgical changes since previsit or office visit. 

## 2018-07-15 ENCOUNTER — Telehealth: Payer: Self-pay

## 2018-07-15 NOTE — Telephone Encounter (Signed)
  Follow up Call-  Call back number 07/14/2018  Post procedure Call Back phone  # [phone] hm  Permission to leave phone message Yes  Some recent data might be hidden     Patient questions:  Do you have a fever, pain , or abdominal swelling? No. Pain Score  0 *  Have you tolerated food without any problems? Yes.    Have you been able to return to your normal activities? Yes.    Do you have any questions about your discharge instructions: Diet   No. Medications  No. Follow up visit  No.  Do you have questions or concerns about your Care? No.  Actions: * If pain score is 4 or above: No action needed, pain <4.

## 2018-07-19 ENCOUNTER — Encounter: Payer: Self-pay | Admitting: Internal Medicine

## 2018-08-16 ENCOUNTER — Other Ambulatory Visit: Payer: Self-pay | Admitting: Medical

## 2018-08-16 DIAGNOSIS — Z1231 Encounter for screening mammogram for malignant neoplasm of breast: Secondary | ICD-10-CM

## 2018-08-16 DIAGNOSIS — R69 Illness, unspecified: Secondary | ICD-10-CM | POA: Diagnosis not present

## 2018-09-01 ENCOUNTER — Ambulatory Visit
Admission: RE | Admit: 2018-09-01 | Discharge: 2018-09-01 | Disposition: A | Discharge status: Home / Self Care | Payer: Medicare HMO | Source: Ambulatory Visit | Attending: Medical | Admitting: Medical

## 2018-09-01 DIAGNOSIS — Z1231 Encounter for screening mammogram for malignant neoplasm of breast: Secondary | ICD-10-CM | POA: Diagnosis not present

## 2018-11-24 ENCOUNTER — Other Ambulatory Visit: Payer: Self-pay | Admitting: Medical

## 2018-11-24 DIAGNOSIS — I10 Essential (primary) hypertension: Secondary | ICD-10-CM

## 2018-11-24 DIAGNOSIS — E782 Mixed hyperlipidemia: Secondary | ICD-10-CM

## 2018-11-25 ENCOUNTER — Other Ambulatory Visit: Payer: Self-pay

## 2018-11-25 ENCOUNTER — Other Ambulatory Visit: Payer: Self-pay | Admitting: Medical

## 2018-11-25 DIAGNOSIS — I10 Essential (primary) hypertension: Secondary | ICD-10-CM

## 2018-11-25 MED ORDER — LOSARTAN POTASSIUM 50 MG PO TABS: 50.0000 mg | ORAL_TABLET | Freq: Every day | ORAL | 0 refills | Status: DC

## 2018-12-13 ENCOUNTER — Ambulatory Visit (INDEPENDENT_AMBULATORY_CARE_PROVIDER_SITE_OTHER): Payer: Medicare Other | Admitting: Medical

## 2018-12-13 ENCOUNTER — Encounter: Payer: Self-pay | Admitting: Medical

## 2018-12-13 VITALS — BP 116/70 | HR 67 | Temp 97.5°F | Resp 16 | Ht 61.0 in | Wt 139.6 lb

## 2018-12-13 DIAGNOSIS — R609 Edema, unspecified: Secondary | ICD-10-CM

## 2018-12-13 DIAGNOSIS — E2839 Other primary ovarian failure: Secondary | ICD-10-CM

## 2018-12-13 DIAGNOSIS — I10 Essential (primary) hypertension: Secondary | ICD-10-CM

## 2018-12-13 DIAGNOSIS — Z7189 Other specified counseling: Secondary | ICD-10-CM

## 2018-12-13 DIAGNOSIS — M159 Polyosteoarthritis, unspecified: Secondary | ICD-10-CM

## 2018-12-13 DIAGNOSIS — Z0000 Encounter for general adult medical examination without abnormal findings: Secondary | ICD-10-CM

## 2018-12-13 DIAGNOSIS — Z7185 Encounter for immunization safety counseling: Secondary | ICD-10-CM | POA: Insufficient documentation

## 2018-12-13 DIAGNOSIS — G3184 Mild cognitive impairment, so stated: Secondary | ICD-10-CM

## 2018-12-13 DIAGNOSIS — E782 Mixed hyperlipidemia: Secondary | ICD-10-CM

## 2018-12-13 DIAGNOSIS — M1711 Unilateral primary osteoarthritis, right knee: Secondary | ICD-10-CM

## 2018-12-13 DIAGNOSIS — E559 Vitamin D deficiency, unspecified: Secondary | ICD-10-CM

## 2018-12-13 DIAGNOSIS — Z9181 History of falling: Secondary | ICD-10-CM | POA: Diagnosis not present

## 2018-12-13 DIAGNOSIS — M8949 Other hypertrophic osteoarthropathy, multiple sites: Secondary | ICD-10-CM

## 2018-12-13 DIAGNOSIS — D649 Anemia, unspecified: Secondary | ICD-10-CM

## 2018-12-13 DIAGNOSIS — R7309 Other abnormal glucose: Secondary | ICD-10-CM

## 2018-12-13 DIAGNOSIS — K635 Polyp of colon: Secondary | ICD-10-CM | POA: Insufficient documentation

## 2018-12-13 DIAGNOSIS — Z1159 Encounter for screening for other viral diseases: Secondary | ICD-10-CM | POA: Insufficient documentation

## 2018-12-13 DIAGNOSIS — Z780 Asymptomatic menopausal state: Secondary | ICD-10-CM

## 2018-12-13 DIAGNOSIS — M150 Primary generalized (osteo)arthritis: Secondary | ICD-10-CM

## 2018-12-13 LAB — POCT URINALYSIS DIP (PROADVANTAGE DEVICE)
Bilirubin, UA: NEGATIVE
Blood, UA: NEGATIVE
Glucose, UA: NEGATIVE mg/dL
Ketones, POC UA: NEGATIVE mg/dL
Leukocytes, UA: NEGATIVE
NITRITE UA: NEGATIVE
Protein Ur, POC: NEGATIVE mg/dL
Specific Gravity, Urine: 1.01
Urobilinogen, Ur: NEGATIVE
pH, UA: 6.5 (ref 5.0–8.0)

## 2018-12-13 NOTE — Patient Instructions (Addendum)
Recommendations:  Cancer screening  You are up-to-date on colonoscopy and mammogram  Get your mammogram yearly  You are due for a pelvic exam.  Please schedule an appointment to see nurse practitioner Vickie here at your convenience for pelvic exam  We are doing routine screening labs today  We did an EKG heart disease screen today  You are due for a bone density test which is a type of x-ray to screen for osteoporosis.  Please schedule this at your convenience.  The order is already in the computer  Please call [facility] to schedule your Bone Density xray.   Their hours are 8am - 4:30 pm Monday - Friday.  Take your insurance card with you.  [facility] [phone]  [address], [place], [place] [address]  [address], [place] [address]   Vaccines:  Shingles vaccine:  I recommend you have a shingles vaccine to help prevent shingles or herpes zoster outbreak.   Please call your insurer to inquire about coverage for the Shingrix vaccine given in 2 doses.   Some insurers cover this vaccine after age 71, some cover this after age 81.  If your insurer covers this, then call to schedule appointment to have this vaccine here.  Also call insurance about coverage for Prenvar 13 and Tetanus diptheria vaccine    Fall Prevention in the Home, Adult Falls can cause injuries. They can happen to people of all ages. There are many things you can do to make your home safe and to help prevent falls. Ask for help when making these changes, if needed. What actions can I take to prevent falls? General Instructions  Use good lighting in all rooms. Replace any light bulbs that burn out.  Turn on the lights when you go into a dark area. Use night-lights.  Keep items that you use often in easy-to-reach places. Lower the shelves around your home if necessary.  Set up your furniture so you have a clear path. Avoid moving your furniture around.  Do not have  throw rugs and other things on the floor that can make you trip.  Avoid walking on wet floors.  If any of your floors are uneven, fix them.  Add color or contrast paint or tape to clearly mark and help you see: ? Any grab bars or handrails. ? First and last steps of stairways. ? Where the edge of each step is.  If you use a stepladder: ? Make sure that it is fully opened. Do not climb a closed stepladder. ? Make sure that both sides of the stepladder are locked into place. ? Ask someone to hold the stepladder for you while you use it.  If there are any pets around you, be aware of where they are. What can I do in the bathroom?      Keep the floor dry. Clean up any water that spills onto the floor as soon as it happens.  Remove soap buildup in the tub or shower regularly.  Use non-skid mats or decals on the floor of the tub or shower.  Attach bath mats securely with double-sided, non-slip rug tape.  If you need to sit down in the shower, use a plastic, non-slip stool.  Install grab bars by the toilet and in the tub and shower. Do not use towel bars as grab bars. What can I do in the bedroom?  Make sure that you have a light by your bed that is easy  to reach.  Do not use any sheets or blankets that are too big for your bed. They should not hang down onto the floor.  Have a firm chair that has side arms. You can use this for support while you get dressed. What can I do in the kitchen?  Clean up any spills right away.  If you need to reach something above you, use a strong step stool that has a grab bar.  Keep electrical cords out of the way.  Do not use floor polish or wax that makes floors slippery. If you must use wax, use non-skid floor wax. What can I do with my stairs?  Do not leave any items on the stairs.  Make sure that you have a light switch at the top of the stairs and the bottom of the stairs. If you do not have them, ask someone to add them for  you.  Make sure that there are handrails on both sides of the stairs, and use them. Fix handrails that are broken or loose. Make sure that handrails are as long as the stairways.  Install non-slip stair treads on all stairs in your home.  Avoid having throw rugs at the top or bottom of the stairs. If you do have throw rugs, attach them to the floor with carpet tape.  Choose a carpet that does not hide the edge of the steps on the stairway.  Check any carpeting to make sure that it is firmly attached to the stairs. Fix any carpet that is loose or worn. What can I do on the outside of my home?  Use bright outdoor lighting.  Regularly fix the edges of walkways and driveways and fix any cracks.  Remove anything that might make you trip as you walk through a door, such as a raised step or threshold.  Trim any bushes or trees on the path to your home.  Regularly check to see if handrails are loose or broken. Make sure that both sides of any steps have handrails.  Install guardrails along the edges of any raised decks and porches.  Clear walking paths of anything that might make someone trip, such as tools or rocks.  Have any leaves, snow, or ice cleared regularly.  Use sand or salt on walking paths during winter.  Clean up any spills in your garage right away. This includes grease or oil spills. What other actions can I take?  Wear shoes that: ? Have a low heel. Do not wear high heels. ? Have rubber bottoms. ? Are comfortable and fit you well. ? Are closed at the toe. Do not wear open-toe sandals.  Use tools that help you move around (mobility aids) if they are needed. These include: ? Canes. ? Walkers. ? Scooters. ? Crutches.  Review your medicines with your doctor. Some medicines can make you feel dizzy. This can increase your chance of falling. Ask your doctor what other things you can do to help prevent falls. Where to find more information  Centers for Disease Control  and Prevention, STEADI: https://garcia.biz/  [employer] on Aging: BrainJudge.co.uk Contact a doctor if:  You are afraid of falling at home.  You feel weak, drowsy, or dizzy at home.  You fall at home. Summary  There are many simple things that you can do to make your home safe and to help prevent falls.  Ways to make your home safe include removing tripping hazards and installing grab bars in the bathroom.  Ask  for help when making these changes in your home. This information is not intended to replace advice given to you by your health care provider. Make sure you discuss any questions you have with your health care provider. Document Released: 08/15/2009 Document Revised: 06/03/2017 Document Reviewed: 06/03/2017 Elsevier Interactive Patient Education  2019 [employer] 65 Years and Older, Female Preventive care refers to lifestyle choices and visits with your health care provider that can promote health and wellness. What does preventive care include?  A yearly physical exam. This is also called an annual well check.  Dental exams once or twice a year.  Routine eye exams. Ask your health care provider how often you should have your eyes checked.  Personal lifestyle choices, including: ? Daily care of your teeth and gums. ? Regular physical activity. ? Eating a healthy diet. ? Avoiding tobacco and drug use. ? Limiting alcohol use. ? Practicing safe sex. ? Taking low-dose aspirin every day. ? Taking vitamin and mineral supplements as recommended by your health care provider. What happens during an annual well check? The services and screenings done by your health care provider during your annual well check will depend on your age, overall health, lifestyle risk factors, and family history of disease. Counseling Your health care provider may ask you questions about your:  Alcohol use.  Tobacco use.  Drug use.  Emotional  well-being.  Home and relationship well-being.  Sexual activity.  Eating habits.  History of falls.  Memory and ability to understand (cognition).  Work and work Statistician.  Reproductive health.  Screening You may have the following tests or measurements:  Height, weight, and BMI.  Blood pressure.  Lipid and cholesterol levels. These may be checked every 5 years, or more frequently if you are over 71 years old.  Skin check.  Lung cancer screening. You may have this screening every year starting at age 52 if you have a 30-pack-year history of smoking and currently smoke or have quit within the past 15 years.  Colorectal cancer screening. All adults should have this screening starting at age 42 and continuing until age 57. You will have tests every 1-10 years, depending on your results and the type of screening test. People at increased risk should start screening at an earlier age. Screening tests may include: ? Guaiac-based fecal occult blood testing. ? Fecal immunochemical test (FIT). ? Stool DNA test. ? Virtual colonoscopy. ? Sigmoidoscopy. During this test, a flexible tube with a tiny camera (sigmoidoscope) is used to examine your rectum and lower colon. The sigmoidoscope is inserted through your anus into your rectum and lower colon. ? Colonoscopy. During this test, a long, thin, flexible tube with a tiny camera (colonoscope) is used to examine your entire colon and rectum.  Hepatitis C blood test.  Hepatitis B blood test.  Sexually transmitted disease (STD) testing.  Diabetes screening. This is done by checking your blood sugar (glucose) after you have not eaten for a while (fasting). You may have this done every 1-3 years.  Bone density scan. This is done to screen for osteoporosis. You may have this done starting at age 53.  Mammogram. This may be done every 1-2 years. Talk to your health care provider about how often you should have regular mammograms. Talk  with your health care provider about your test results, treatment options, and if necessary, the need for more tests. Vaccines Your health care provider may recommend certain vaccines, such as:  Influenza vaccine. This is recommended every year.  Tetanus, diphtheria, and acellular pertussis (Tdap, Td) vaccine. You may need a Td booster every 10 years.  Varicella vaccine. You may need this if you have not been vaccinated.  Zoster vaccine. You may need this after age 41.  Measles, mumps, and rubella (MMR) vaccine. You may need at least one dose of MMR if you were born in 1957 or later. You may also need a second dose.  Pneumococcal 13-valent conjugate (PCV13) vaccine. One dose is recommended after age 30.  Pneumococcal polysaccharide (PPSV23) vaccine. One dose is recommended after age 24.  Meningococcal vaccine. You may need this if you have certain conditions.  Hepatitis A vaccine. You may need this if you have certain conditions or if you travel or work in places where you may be exposed to hepatitis A.  Hepatitis B vaccine. You may need this if you have certain conditions or if you travel or work in places where you may be exposed to hepatitis B.  Haemophilus influenzae type b (Hib) vaccine. You may need this if you have certain conditions. Talk to your health care provider about which screenings and vaccines you need and how often you need them. This information is not intended to replace advice given to you by your health care provider. Make sure you discuss any questions you have with your health care provider. Document Released: 11/15/2015 Document Revised: 12/09/2017 Document Reviewed: 08/20/2015 Elsevier Interactive Patient Education  2019 [employer].

## 2018-12-13 NOTE — Progress Notes (Signed)
Subjective:    Nancy Castro is a 68 y.o. female who presents for Preventative Services visit and chronic medical problems/med check visit.    Primary Care Provider Martha Ellerby, Camelia Eng, PA-C here for primary care  Current Health Care Team:  Dr. Ellouise Newer, neurology, prior consult 2016  Dr. Zenovia Jarred, [place] you may have received from other than [facility] providers in the past year (date may be approximate) none  Exercise Current exercise habits: walk, mow grass , elliptical at home  Nutrition/Diet Current diet: healthy eating  Depression Screen Depression screen [facility] 2/9 12/13/2018  Decreased Interest 0  Down, Depressed, Hopeless 0  PHQ - 2 Score 0    Activities of Daily Living Screen/Functional Status Survey Is the patient deaf or have difficulty hearing?: No Does the patient have difficulty seeing, even when wearing glasses/contacts?: No Does the patient have difficulty concentrating, remembering, or making decisions?: No Does the patient have difficulty walking or climbing stairs?: No Does the patient have difficulty dressing or bathing?: No Does the patient have difficulty doing errands alone such as visiting a doctor's office or shopping?: No  Can patient draw a clock face showing 2:10 oclock, yes  [place]  12/13/2018 08/25/2017 02/05/2016 08/07/2015  Falls in the past year? 0 No No No    Gait Assessment: Normal gait observed yes  Advanced directives Does patient have a [place]? Yes  Does patient have a Living Will? Yes   Past Medical History:  Diagnosis Date  . DDD (degenerative disc disease), cervical   . H/O echocardiogram 2007  . History of exercise stress test 2007  . Hyperlipidemia   . Hypertension   . Mixed dyslipidemia   . Osteoarthritis   . Syncope 2012   cardiac stress test   . Wears glasses   . Wears partial dentures    upper and lower    Past Surgical History:  Procedure Laterality  Date  . COLONOSCOPY     2014, polyps, repeat 3 years  . COLONOSCOPY  07/14/2018   tubular adenoma polyps, repeat 3 years, Dr. Zenovia Jarred  . KNEE ARTHROSCOPY     right  . LIPOMA EXCISION     back x 2  . PARTIAL HYSTERECTOMY     fibroids; has both ovaries    Social History   Socioeconomic History  . Marital status: Married    Spouse name: Not on file  . Number of children: Not on file  . Years of education: Not on file  . Highest education level: Not on file  Occupational History  . Not on file  Social Needs  . Financial resource strain: Not on file  . Food insecurity:    Worry: Not on file    Inability: Not on file  . Transportation needs:    Medical: Not on file    Non-medical: Not on file  Tobacco Use  . Smoking status: Former Smoker    Packs/day: 0.25    Years: 5.00    Pack years: 1.25    Last attempt to quit: 02/08/1987    Years since quitting: 31.8  . Smokeless tobacco: Former Systems developer    Types: Chew  . Tobacco comment: Stopped chewing years  ago.  Substance and Sexual Activity  . Alcohol use: No    Alcohol/week: 0.0 standard drinks  . Drug use: No  . Sexual activity: Not on file    Comment: 1986  Lifestyle  . Physical  activity:    Days per week: Not on file    Minutes per session: Not on file  . Stress: Not on file  Relationships  . Social connections:    Talks on phone: Not on file    Gets together: Not on file    Attends religious service: Not on file    Active member of club or organization: Not on file    Attends meetings of clubs or organizations: Not on file    Relationship status: Not on file  . Intimate partner violence:    Fear of current or ex partner: Not on file    Emotionally abused: Not on file    Physically abused: Not on file    Forced sexual activity: Not on file  Other Topics Concern  . 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  No grandchildren.   Sews - hobby.  12/2018    Family History  Problem Relation Age of Onset  . Cancer Mother        died of breast cancer  . Cancer Father        father lung cancer  . Cancer Sister        lung, mets to brain  . Cancer Brother        lung and stomach  . Stomach cancer Brother        died at age 91  . Hypertension Maternal Grandmother   . Cancer Brother        lung  . Diabetes Brother   . Heart disease Neg Hx   . Stroke Neg Hx   . Colon cancer Neg Hx   . Rectal cancer Neg Hx      Current Outpatient Medications:  .  aspirin 81 MG tablet, Take 1 tablet (81 mg total) by mouth daily., Disp: 90 tablet, Rfl: 3 .  atorvastatin (LIPITOR) 40 MG tablet, TAKE 1 TABLET(40 MG) BY MOUTH DAILY, Disp: 30 tablet, Rfl: 0 .  losartan (COZAAR) 50 MG tablet, Take 1 tablet (50 mg total) by mouth daily., Disp: 30 tablet, Rfl: 0 .  MULTIPLE VITAMIN PO, Take by mouth daily., Disp: , Rfl:  .  niacin 250 MG tablet, Take 1 tablet (250 mg total) by mouth at bedtime. 2 tablets OTC daily, Disp: 180 tablet, Rfl: 3 .  Vitamin D, Cholecalciferol, 400 units TABS, Take 1 tablet by mouth 2 (two) times daily. (Patient taking differently: Take 1,000 Units by mouth daily. ), Disp: 60 tablet, Rfl: 11  No Known Allergies  History reviewed: allergies, current medications, past family history, past medical history, past social history, past surgical history and problem list  Chronic issues discussed: Compliant with medication  Denies any recent memory decline  Acute issues discussed: none  Objective:      Biometrics BP 116/70   Pulse 67   Temp (!) 97.5 F (36.4 C) (Oral)   Resp 16   Ht 5\' 1"  (1.549 m)   Wt 139 lb 9.6 oz (63.3 kg)   SpO2 97%   BMI 26.38 kg/m   Wt Readings from Last 3 Encounters:  12/13/18 139 lb 9.6 oz (63.3 kg)  07/14/18 138 lb (62.6 kg)  06/30/18 138 lb 3.2 oz (62.7 kg)    Cognitive Testing  Alert? Yes   Normal Appearance?yes   Oriented to person?  Yes   Place? Yes  Time? Yes   Recall of three objects?  Yes   Can perform simple calculations? Some difficulty with counting down from 100s/serial 7s   Displays appropriate judgment?yes   Can read the correct time from a watch face?yes   General appearance: alert, no distress, WD/WN, AA female  Nutritional Status: Inadequate calore intake? no Loss of muscle mass? no Loss of fat beneath skin? no Localized or general edema? no Diminished functional status? no  Other pertinent exam: HEENT: normocephalic, sclerae anicteric, TMs pearly, nares patent, no discharge or erythema, pharynx normal Oral cavity: MMM, no lesions Neck: supple, no lymphadenopathy, no thyromegaly, no masses Heart: RRR, normal S1, S2, no murmurs Lungs: CTA bilaterally, no wheezes, rhonchi, or rales Abdomen: +bs, soft, non tender, non distended, no masses, no hepatomegaly, no splenomegaly Musculoskeletal: nontender, no swelling, no obvious deformity Extremities: no edema, no cyanosis, no clubbing Pulses: 2+ symmetric, upper and lower extremities, normal cap refill Neurological: alert, oriented x 3, CN2-12 intact, strength normal upper extremities and lower extremities, sensation normal throughout, DTRs 2+ throughout, no cerebellar signs, gait normal Psychiatric: normal affect, behavior normal, pleasant  Breast/gyn - declined  EKG done for heart disease screening and hypertension Rate 64 bpm, normal sinus rhythm, PR interval 154 ms, QRS duration 70 ms, QTC 420 ms, axis -2 degrees, no change from prior EKG 2014  Assessment:   Encounter Diagnoses  Name Primary?  . Essential hypertension Yes  . Medicare annual wellness visit, subsequent   . History of fall   . Estrogen deficiency   . Elevated glucose   . Edema, unspecified type   . Anemia, unspecified type   . Primary osteoarthritis involving multiple joints   . Arthritis of right knee   . Mild cognitive impairment   . Mixed dyslipidemia   . Postmenopausal    . Vitamin D deficiency   . Routine general medical examination at a health care facility   . Polyp of colon, unspecified part of colon, unspecified type   . Vaccine counseling   . Need for hepatitis C screening test      Plan:   A preventative services visit was completed today.  During the course of the visit today, we discussed and counseled about appropriate screening and preventive services.  A health risk assessment was established today that included a review of current medications, allergies, social history, family history, medical and preventative health history, biometrics, and preventative screenings to identify potential safety concerns or impairments.  A personalized plan was printed today for your records and use.   Personalized health advice and education was given today to reduce health risks and promote self management and wellness.  Information regarding end of life planning was discussed today.  Conditions/risks identified: Memory loss, mild - stable  Chronic problems discussed today: Hypertension-continue current medication  Mixed dyslipidemia-continue current medication  Vitamin D deficiency-continue vitamin D supplement, get fish in the diet  Memory loss-discussed ways to keep the brain active    Acute problems discussed today: none  Recommendations:  I recommend a yearly ophthalmology/optometry visit for glaucoma screening and eye checkup  I recommended a yearly dental visit for hygiene and checkup  Advanced directives - discussed nature and purpose of Advanced Directives, encouraged them to complete them if they have not done so and/or encouraged them to get [place] a copy if they have done this already.  Recommendations:  Cancer screening  You are up-to-date on colonoscopy and mammogram  Get your mammogram yearly  You are due for  a pelvic exam.  Please schedule an appointment to see nurse practitioner Vickie here at your convenience for pelvic  exam  We are doing routine screening labs today  We did an EKG heart disease screen today  You are due for a bone density test which is a type of x-ray to screen for osteoporosis.  Please schedule this at your convenience.  The order is already in the computer  Please call [facility] to schedule your Bone Density xray.   Their hours are 8am - 4:30 pm Monday - Friday.  Take your insurance card with you.  [facility] [phone]  [address], [place], [place] [address]  [address], [place] [address]   Vaccines:  Shingles vaccine:  I recommend you have a shingles vaccine to help prevent shingles or herpes zoster outbreak.   Please call your insurer to inquire about coverage for the Shingrix vaccine given in 2 doses.   Some insurers cover this vaccine after age 90, some cover this after age 73.  If your insurer covers this, then call to schedule appointment to have this vaccine here.  Also call insurance about coverage for Prenvar 13 and Tetanus diptheria vaccine   Referrals today: none    Aryam was seen today for awv.  Diagnoses and all orders for this visit:  Essential hypertension -     EKG 12-Lead -     Comprehensive metabolic panel -     Lipid panel  Medicare annual wellness visit, subsequent  History of fall  Estrogen deficiency -     DG Bone Density; Future  Elevated glucose -     Comprehensive metabolic panel  Edema, unspecified type -     Comprehensive metabolic panel  Anemia, unspecified type -     CBC  Primary osteoarthritis involving multiple joints  Arthritis of right knee  Mild cognitive impairment -     HIV Antibody (routine testing w rflx) -     Hepatitis C antibody  Mixed dyslipidemia -     Lipid panel  Postmenopausal -     DG Bone Density; Future  Vitamin D deficiency -     DG Bone Density; Future  Routine general medical examination at a health care facility -     POCT Urinalysis DIP  (Proadvantage Device)  Polyp of colon, unspecified part of colon, unspecified type  Vaccine counseling  Need for hepatitis C screening test    Medicare Attestation A preventative services visit was completed today.  During the course of the visit the patient was educated and counseled about appropriate screening and preventive services.  A health risk assessment was established with the patient that included a review of current medications, allergies, social history, family history, medical and preventative health history, biometrics, and preventative screenings to identify potential safety concerns or impairments.  A personalized plan was printed today for the patient's records and use.   Personalized health advice and education was given today to reduce health risks and promote self management and wellness.  Information regarding end of life planning was discussed today.  Dorothea Ogle, PA-C   12/13/2018

## 2018-12-14 ENCOUNTER — Other Ambulatory Visit: Payer: Self-pay | Admitting: Medical

## 2018-12-14 DIAGNOSIS — I10 Essential (primary) hypertension: Secondary | ICD-10-CM

## 2018-12-14 DIAGNOSIS — E782 Mixed hyperlipidemia: Secondary | ICD-10-CM

## 2018-12-14 LAB — COMPREHENSIVE METABOLIC PANEL
ALBUMIN: 4.8 g/dL (ref 3.8–4.8)
ALT: 16 IU/L (ref 0–32)
AST: 22 IU/L (ref 0–40)
Albumin/Globulin Ratio: 2.5 — ABNORMAL HIGH (ref 1.2–2.2)
Alkaline Phosphatase: 75 IU/L (ref 39–117)
BUN/Creatinine Ratio: 13 (ref 12–28)
BUN: 9 mg/dL (ref 8–27)
Bilirubin Total: 0.5 mg/dL (ref 0.0–1.2)
CO2: 24 mmol/L (ref 20–29)
Calcium: 10.1 mg/dL (ref 8.7–10.3)
Chloride: 101 mmol/L (ref 96–106)
Creatinine, Ser: 0.68 mg/dL (ref 0.57–1.00)
GFR calc non Af Amer: 90 mL/min/{1.73_m2} (ref >59)
GFR, EST AFRICAN AMERICAN: 104 mL/min/{1.73_m2} (ref >59)
Globulin, Total: 1.9 g/dL (ref 1.5–4.5)
Glucose: 90 mg/dL (ref 65–99)
Potassium: 4.6 mmol/L (ref 3.5–5.2)
Sodium: 141 mmol/L (ref 134–144)
Total Protein: 6.7 g/dL (ref 6.0–8.5)

## 2018-12-14 LAB — LIPID PANEL
CHOL/HDL RATIO: 2.3 ratio (ref 0.0–4.4)
Cholesterol, Total: 176 mg/dL (ref 100–199)
HDL: 77 mg/dL (ref >39)
LDL Calculated: 85 mg/dL (ref 0–99)
Triglycerides: 71 mg/dL (ref 0–149)
VLDL Cholesterol Cal: 14 mg/dL (ref 5–40)

## 2018-12-14 LAB — CBC
HEMATOCRIT: 37.8 % (ref 34.0–46.6)
Hemoglobin: 12.6 g/dL (ref 11.1–15.9)
MCH: 32.2 pg (ref 26.6–33.0)
MCHC: 33.3 g/dL (ref 31.5–35.7)
MCV: 97 fL (ref 79–97)
Platelets: 281 10*3/uL (ref 150–450)
RBC: 3.91 x10E6/uL (ref 3.77–5.28)
RDW: 11.6 % — AB (ref 11.7–15.4)
WBC: 3.3 10*3/uL — ABNORMAL LOW (ref 3.4–10.8)

## 2018-12-14 LAB — HIV ANTIBODY (ROUTINE TESTING W REFLEX): HIV Screen 4th Generation wRfx: NONREACTIVE

## 2018-12-14 LAB — HEPATITIS C ANTIBODY: Hep C Virus Ab: <0.1 s/co ratio (ref 0.0–0.9)

## 2018-12-14 MED ORDER — NIACIN 250 MG PO TABS: 250.0000 mg | ORAL_TABLET | Freq: Every day | ORAL | 3 refills | Status: DC

## 2018-12-14 MED ORDER — ASPIRIN 81 MG PO TABS: 81.0000 mg | ORAL_TABLET | Freq: Every day | ORAL | 3 refills | Status: DC

## 2018-12-14 MED ORDER — LOSARTAN POTASSIUM 50 MG PO TABS: 50.0000 mg | ORAL_TABLET | Freq: Every day | ORAL | 3 refills | Status: DC

## 2018-12-14 MED ORDER — VITAMIN D (CHOLECALCIFEROL) 10 MCG (400 UNIT) PO TABS: 2.0000 | ORAL_TABLET | Freq: Every day | ORAL | 3 refills | Status: DC

## 2018-12-14 MED ORDER — ATORVASTATIN CALCIUM 40 MG PO TABS: 40.0000 mg | ORAL_TABLET | Freq: Every day | ORAL | 3 refills | Status: DC

## 2019-09-25 ENCOUNTER — Telehealth: Payer: Self-pay | Admitting: Medical

## 2019-09-25 NOTE — Telephone Encounter (Signed)
Pt called and needs refill on  Atorvastatin and Losartan sent to the [facility] on [address]

## 2019-09-25 NOTE — Telephone Encounter (Signed)
Please call [facility].  My last refill notes shows last filled for 90 days with refills for a year.  So she should have refills through 12/2019 I believe.

## 2019-09-25 NOTE — Telephone Encounter (Signed)
Patient was informed that a refill should be at the pharmacy for her. She stated she will go pick it up tomorrow.

## 2019-12-18 ENCOUNTER — Telehealth: Payer: Self-pay | Admitting: Medical

## 2019-12-18 NOTE — Telephone Encounter (Signed)
Pt called and states that she had her first COVID vaccine. She states her arm is red at injection site.Wants to know if this is normal. Please advise at [phone].

## 2019-12-18 NOTE — Telephone Encounter (Signed)
Spoke to patient, she got her vaccine on 12/17/19 at [facility]. Patient stated it is not warm to touch, not itching, not swollen. Patient has been informed that vaccines can cause redness and that she should seek medical attention if it becomes painful, inflamed, warm to the touch.

## 2019-12-20 ENCOUNTER — Other Ambulatory Visit: Payer: Self-pay | Admitting: Medical

## 2019-12-20 DIAGNOSIS — E782 Mixed hyperlipidemia: Secondary | ICD-10-CM

## 2019-12-20 DIAGNOSIS — I10 Essential (primary) hypertension: Secondary | ICD-10-CM

## 2019-12-28 ENCOUNTER — Ambulatory Visit (INDEPENDENT_AMBULATORY_CARE_PROVIDER_SITE_OTHER): Payer: Medicare Other | Admitting: Medical

## 2019-12-28 ENCOUNTER — Other Ambulatory Visit: Payer: Self-pay

## 2019-12-28 ENCOUNTER — Encounter: Payer: Self-pay | Admitting: Medical

## 2019-12-28 VITALS — BP 134/72 | HR 81 | Temp 98.0°F | Ht 61.0 in | Wt 134.2 lb

## 2019-12-28 DIAGNOSIS — Z23 Encounter for immunization: Secondary | ICD-10-CM | POA: Diagnosis not present

## 2019-12-28 DIAGNOSIS — T148XXA Other injury of unspecified body region, initial encounter: Secondary | ICD-10-CM | POA: Diagnosis not present

## 2019-12-28 DIAGNOSIS — T300 Burn of unspecified body region, unspecified degree: Secondary | ICD-10-CM | POA: Diagnosis not present
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
## 2020-01-07 ENCOUNTER — Emergency Department (HOSPITAL_COMMUNITY): Payer: Medicare Other

## 2020-01-07 ENCOUNTER — Encounter (HOSPITAL_COMMUNITY): Payer: Self-pay | Admitting: Emergency Medicine

## 2020-01-07 ENCOUNTER — Other Ambulatory Visit: Payer: Self-pay

## 2020-01-07 ENCOUNTER — Inpatient Hospital Stay (HOSPITAL_COMMUNITY)
Admission: EM | Admit: 2020-01-07 | Discharge: 2020-01-10 | DRG: 071 | Disposition: A | Discharge status: Home / Self Care | Payer: Medicare Other | Attending: Internal Medicine | Admitting: Internal Medicine

## 2020-01-07 DIAGNOSIS — I959 Hypotension, unspecified: Secondary | ICD-10-CM | POA: Diagnosis present

## 2020-01-07 DIAGNOSIS — R945 Abnormal results of liver function studies: Secondary | ICD-10-CM

## 2020-01-07 DIAGNOSIS — R4189 Other symptoms and signs involving cognitive functions and awareness: Secondary | ICD-10-CM | POA: Diagnosis present

## 2020-01-07 DIAGNOSIS — Z86010 Personal history of colonic polyps: Secondary | ICD-10-CM

## 2020-01-07 DIAGNOSIS — Z8249 Family history of ischemic heart disease and other diseases of the circulatory system: Secondary | ICD-10-CM

## 2020-01-07 DIAGNOSIS — G9341 Metabolic encephalopathy: Secondary | ICD-10-CM | POA: Diagnosis not present

## 2020-01-07 DIAGNOSIS — Z79899 Other long term (current) drug therapy: Secondary | ICD-10-CM

## 2020-01-07 DIAGNOSIS — Z781 Physical restraint status: Secondary | ICD-10-CM

## 2020-01-07 DIAGNOSIS — E782 Mixed hyperlipidemia: Secondary | ICD-10-CM

## 2020-01-07 DIAGNOSIS — M5030 Other cervical disc degeneration, unspecified cervical region: Secondary | ICD-10-CM | POA: Diagnosis present

## 2020-01-07 DIAGNOSIS — R7989 Other specified abnormal findings of blood chemistry: Secondary | ICD-10-CM

## 2020-01-07 DIAGNOSIS — F0391 Unspecified dementia with behavioral disturbance: Secondary | ICD-10-CM | POA: Diagnosis present

## 2020-01-07 DIAGNOSIS — Z7982 Long term (current) use of aspirin: Secondary | ICD-10-CM

## 2020-01-07 DIAGNOSIS — Z86011 Personal history of benign neoplasm of the brain: Secondary | ICD-10-CM

## 2020-01-07 DIAGNOSIS — R4182 Altered mental status, unspecified: Secondary | ICD-10-CM | POA: Diagnosis present

## 2020-01-07 DIAGNOSIS — I10 Essential (primary) hypertension: Secondary | ICD-10-CM | POA: Diagnosis not present

## 2020-01-07 DIAGNOSIS — Z20822 Contact with and (suspected) exposure to covid-19: Secondary | ICD-10-CM | POA: Diagnosis present

## 2020-01-07 DIAGNOSIS — Z87891 Personal history of nicotine dependence: Secondary | ICD-10-CM

## 2020-01-07 DIAGNOSIS — G9340 Encephalopathy, unspecified: Secondary | ICD-10-CM | POA: Diagnosis present

## 2020-01-07 LAB — COMPREHENSIVE METABOLIC PANEL
ALT: 16 U/L (ref 0–44)
AST: 17 U/L (ref 15–41)
Albumin: 4.2 g/dL (ref 3.5–5.0)
Alkaline Phosphatase: 52 U/L (ref 38–126)
Anion gap: 14 (ref 5–15)
BUN: 13 mg/dL (ref 8–23)
CO2: 23 mmol/L (ref 22–32)
Calcium: 9.4 mg/dL (ref 8.9–10.3)
Chloride: 104 mmol/L (ref 98–111)
Creatinine, Ser: 0.86 mg/dL (ref 0.44–1.00)
GFR calc Af Amer: >60 mL/min (ref >60)
GFR calc non Af Amer: >60 mL/min (ref >60)
Glucose, Bld: 92 mg/dL (ref 70–99)
Potassium: 3.6 mmol/L (ref 3.5–5.1)
Sodium: 141 mmol/L (ref 135–145)
Total Bilirubin: 8.6 mg/dL — ABNORMAL HIGH (ref 0.3–1.2)
Total Protein: 7 g/dL (ref 6.5–8.1)

## 2020-01-07 LAB — AMMONIA: Ammonia: 18 umol/L (ref 9–35)

## 2020-01-07 LAB — RAPID URINE DRUG SCREEN, HOSP PERFORMED
Amphetamines: NOT DETECTED
Barbiturates: NOT DETECTED
Benzodiazepines: NOT DETECTED
Cocaine: NOT DETECTED
Opiates: NOT DETECTED
Tetrahydrocannabinol: NOT DETECTED

## 2020-01-07 LAB — CBC WITH DIFFERENTIAL/PLATELET
Abs Immature Granulocytes: 0.03 10*3/uL (ref 0.00–0.07)
Basophils Absolute: 0 10*3/uL (ref 0.0–0.1)
Basophils Relative: 0 %
Eosinophils Absolute: 0 10*3/uL (ref 0.0–0.5)
Eosinophils Relative: 0 %
HCT: 38.3 % (ref 36.0–46.0)
Hemoglobin: 12.4 g/dL (ref 12.0–15.0)
Immature Granulocytes: 0 %
Lymphocytes Relative: 20 %
Lymphs Abs: 1.4 10*3/uL (ref 0.7–4.0)
MCH: 32.3 pg (ref 26.0–34.0)
MCHC: 32.4 g/dL (ref 30.0–36.0)
MCV: 99.7 fL (ref 80.0–100.0)
Monocytes Absolute: 0.4 10*3/uL (ref 0.1–1.0)
Monocytes Relative: 6 %
Neutro Abs: 5.1 10*3/uL (ref 1.7–7.7)
Neutrophils Relative %: 74 %
Platelets: 287 10*3/uL (ref 150–400)
RBC: 3.84 MIL/uL — ABNORMAL LOW (ref 3.87–5.11)
RDW: 12.7 % (ref 11.5–15.5)
WBC: 6.9 10*3/uL (ref 4.0–10.5)
nRBC: 0 % (ref 0.0–0.2)

## 2020-01-07 LAB — SARS CORONAVIRUS 2 (TAT 6-24 HRS): SARS Coronavirus 2: NEGATIVE

## 2020-01-07 LAB — ETHANOL: Alcohol, Ethyl (B): <10 mg/dL (ref <10)

## 2020-01-07 LAB — HIV ANTIBODY (ROUTINE TESTING W REFLEX): HIV Screen 4th Generation wRfx: NONREACTIVE

## 2020-01-07 LAB — SALICYLATE LEVEL: Salicylate Lvl: <7 mg/dL — ABNORMAL LOW (ref 7.0–30.0)

## 2020-01-07 LAB — GLUCOSE, CAPILLARY: Glucose-Capillary: 74 mg/dL (ref 70–99)

## 2020-01-07 LAB — TSH: TSH: 2.18 u[IU]/mL (ref 0.350–4.500)

## 2020-01-07 LAB — ACETAMINOPHEN LEVEL: Acetaminophen (Tylenol), Serum: <10 ug/mL — ABNORMAL LOW (ref 10–30)

## 2020-01-07 MED ORDER — ASPIRIN 81 MG PO CHEW
81.0000 mg | CHEWABLE_TABLET | Freq: Every day | ORAL | Status: DC
  Administered 2020-01-08 – 2020-01-10 (×3): 81 mg via ORAL
  Filled 2020-01-07 (×3): qty 1

## 2020-01-07 MED ORDER — FOLIC ACID 1 MG PO TABS
1.0000 mg | ORAL_TABLET | Freq: Every day | ORAL | Status: DC
  Administered 2020-01-08 – 2020-01-10 (×3): 1 mg via ORAL
  Filled 2020-01-07 (×3): qty 1

## 2020-01-07 MED ORDER — MULTIPLE VITAMIN PO TABS: ORAL_TABLET | Freq: Every day | ORAL | Status: DC

## 2020-01-07 MED ORDER — ONDANSETRON HCL 4 MG/2ML IJ SOLN: 4.0000 mg | Freq: Four times a day (QID) | INTRAMUSCULAR | Status: DC | PRN

## 2020-01-07 MED ORDER — ONDANSETRON HCL 4 MG PO TABS: 4.0000 mg | ORAL_TABLET | Freq: Four times a day (QID) | ORAL | Status: DC | PRN

## 2020-01-07 MED ORDER — DIPHENHYDRAMINE HCL 50 MG/ML IJ SOLN
50.0000 mg | Freq: Once | INTRAMUSCULAR | Status: AC
  Administered 2020-01-07: 50 mg via INTRAMUSCULAR
  Filled 2020-01-07: qty 1

## 2020-01-07 MED ORDER — ATORVASTATIN CALCIUM 40 MG PO TABS
40.0000 mg | ORAL_TABLET | Freq: Every day | ORAL | Status: DC
  Administered 2020-01-09: 40 mg via ORAL
  Filled 2020-01-07 (×2): qty 1

## 2020-01-07 MED ORDER — LACTATED RINGERS IV BOLUS
1000.0000 mL | Freq: Once | INTRAVENOUS | Status: AC
  Administered 2020-01-07: 1000 mL via INTRAVENOUS

## 2020-01-07 MED ORDER — ADULT MULTIVITAMIN W/MINERALS CH
1.0000 | ORAL_TABLET | Freq: Every day | ORAL | Status: DC
  Administered 2020-01-08 – 2020-01-10 (×3): 1 via ORAL
  Filled 2020-01-07 (×3): qty 1

## 2020-01-07 MED ORDER — DEXTROSE IN LACTATED RINGERS 5 % IV SOLN: INTRAVENOUS | Status: DC

## 2020-01-07 MED ORDER — ENOXAPARIN SODIUM 40 MG/0.4ML ~~LOC~~ SOLN
40.0000 mg | SUBCUTANEOUS | Status: DC
  Administered 2020-01-07: 40 mg via SUBCUTANEOUS
  Filled 2020-01-07 (×2): qty 0.4

## 2020-01-07 MED ORDER — ACETAMINOPHEN 650 MG RE SUPP: 650.0000 mg | Freq: Four times a day (QID) | RECTAL | Status: DC | PRN

## 2020-01-07 MED ORDER — ACETAMINOPHEN 325 MG PO TABS
650.0000 mg | ORAL_TABLET | Freq: Four times a day (QID) | ORAL | Status: DC | PRN
  Filled 2020-01-07: qty 2

## 2020-01-07 MED ORDER — THIAMINE HCL 100 MG PO TABS
100.0000 mg | ORAL_TABLET | Freq: Every day | ORAL | Status: DC
  Administered 2020-01-08 – 2020-01-10 (×3): 100 mg via ORAL
  Filled 2020-01-07 (×3): qty 1

## 2020-01-07 NOTE — Progress Notes (Signed)
Patient arrived to unit [id number] nonverbal. Daugther at bedside, attending MD notified.

## 2020-01-07 NOTE — ED Triage Notes (Signed)
BIB EMS, daughter found patient in bed this AM, she was not talking, LSN last night. Per daughter her baseline she can walk and talk, combative with EMS. No hx of psych complaints.

## 2020-01-07 NOTE — H&P (Signed)
History and Physical    Nancy Castro [id number] DOB: November 27, 1950 DOA: 01/07/2020  PCP: Carlena Hurl, PA-C   Patient coming from: Home   Chief Complaint: Altered mental status.   HPI: Nancy Castro is a 69 y.o. female with medical history significant of hypertension and dyslipidemia.  She was brought by her daughter who noticed her to be confused and altered.  Her daughter lives out of town and has been calling her over the phone regularly.  Over the last few months she has noticed her mother to have inappropriate responses, obsessive fixations and persistent worrying. Recently Nancy Castro moved with her sister stating that she needed a break from home.  When she heard that her daughter was coming to visit she moved back to her home.  Her daughter saw her yesterday, she was at her usual state of health, no focal neurologic deficits, but persistent behavioral changes noted in the past.  This morning when her daughter went to her home, she did not open the door, her daughter had to force the door to enter the home. She found Nancy Castro in bed, hyporeactive and combative, nonverbal.  She brought her to the hospital for further evaluation.  All information is obtained from her daughter, patient unable to give detailed history due to her acute cognitive impairment   ED Course: Patient combative, nonverbal, hyporeactive, blood pressure 83/52.  Work-up including CT head negative for acute changes, patient received 1 L of intravenous fluids and was referred for admission for further evaluation.   Review of Systems: Unable to obtained due to patient's altered mentation.   Past Medical History:  Diagnosis Date  . DDD (degenerative disc disease), cervical   . H/O echocardiogram 2007  . History of exercise stress test 2007  . Hyperlipidemia   . Hypertension   . Mixed dyslipidemia   . Osteoarthritis   . Syncope 2012   cardiac stress test   . Wears glasses   . Wears partial  dentures    upper and lower    Past Surgical History:  Procedure Laterality Date  . COLONOSCOPY     2014, polyps, repeat 3 years  . COLONOSCOPY  07/14/2018   tubular adenoma polyps, repeat 3 years, Dr. Zenovia Jarred  . KNEE ARTHROSCOPY     right  . LIPOMA EXCISION     back x 2  . PARTIAL HYSTERECTOMY     fibroids; has both ovaries     reports that she quit smoking about 32 years ago. She has a 1.25 pack-year smoking history. She has quit using smokeless tobacco.  Her smokeless tobacco use included chew. She reports that she does not drink alcohol or use drugs.  No Known Allergies  Family History  Problem Relation Age of Onset  . Cancer Mother        died of breast cancer  . Cancer Father        father lung cancer  . Cancer Sister        lung, mets to brain  . Cancer Brother        lung and stomach  . Stomach cancer Brother        died at age 17  . Hypertension Maternal Grandmother   . Cancer Brother        lung  . Diabetes Brother   . Heart disease Neg Hx   . Stroke Neg Hx   . Colon cancer Neg Hx   . Rectal cancer Neg Hx  Prior to Admission medications   Medication Sig Start Date End Date Taking? Authorizing Provider  aspirin 81 MG tablet Take 1 tablet (81 mg total) by mouth daily. 12/14/18   Tysinger, Camelia Eng, PA-C  atorvastatin (LIPITOR) 40 MG tablet TAKE 1 TABLET(40 MG) BY MOUTH DAILY AT 6 PM 12/20/19   Tysinger, Camelia Eng, PA-C  losartan (COZAAR) 50 MG tablet TAKE 1 TABLET(50 MG) BY MOUTH DAILY 12/20/19   Tysinger, Camelia Eng, PA-C  MULTIPLE VITAMIN PO Take by mouth daily.    [provider]  niacin 250 MG tablet Take 1 tablet (250 mg total) by mouth at bedtime. 2 tablets OTC daily 12/14/18   Tysinger, Camelia Eng, PA-C  Vitamin D, Cholecalciferol, 10 MCG (400 UNIT) TABS Take 2 tablets by mouth daily. 12/14/18   Carlena Hurl, PA-C    Physical Exam: Vitals:   01/07/20 1130 01/07/20 1146 01/07/20 1230 01/07/20 1400  BP: 101/65  94/60 (!) 83/52  Pulse: 82  85 79 81  Resp: 18  18 11   Temp:      TempSrc:      SpO2: 99% 100% 99% 100%  Weight:      Height:        Vitals:   01/07/20 1130 01/07/20 1146 01/07/20 1230 01/07/20 1400  BP: 101/65  94/60 (!) 83/52  Pulse: 82 85 79 81  Resp: 18  18 11   Temp:      TempSrc:      SpO2: 99% 100% 99% 100%  Weight:      Height:       General: deconditioned  Neurology: somnolent but easy to arouse with voice and touch. Non focal, moves all extremities spontaneously, but not following commands or responding to questions.  Head and Neck. Head normocephalic. Neck supple with no adenopathy or thyromegaly.   E ENT: mild pallor, no icterus, oral mucosa dry. Cardiovascular: No JVD. S1-S2 present, rhythmic, no gallops, rubs, or murmurs. No lower extremity edema. Pulmonary: positive breath sounds bilaterally, adequate air movement, no wheezing, rhonchi or rales. Gastrointestinal. Abdomen with no organomegaly, non tender, no rebound or guarding Skin. Superficial cuts in her wrists and forearms bilaterally.  Musculoskeletal: no joint deformities    Labs on Admission: I have personally reviewed following labs and imaging studies  CBC: Recent Labs  Lab 01/07/20 1105  WBC 6.9  NEUTROABS 5.1  HGB 12.4  HCT 38.3  MCV 99.7  PLT [id number]   Basic Metabolic Panel: Recent Labs  Lab 01/07/20 1105  NA 141  K 3.6  CL 104  CO2 23  GLUCOSE 92  BUN 13  CREATININE 0.86  CALCIUM 9.4   GFR: Estimated Creatinine Clearance: 51.7 mL/min (by C-G formula based on SCr of 0.86 mg/dL). Liver Function Tests: Recent Labs  Lab 01/07/20 1105  AST 17  ALT 16  ALKPHOS 52  BILITOT 8.6*  PROT 7.0  ALBUMIN 4.2   No results for input(s): LIPASE, AMYLASE in the last 168 hours. Recent Labs  Lab 01/07/20 1105  AMMONIA 18   Coagulation Profile: No results for input(s): INR, PROTIME in the last 168 hours. Cardiac Enzymes: No results for input(s): CKTOTAL, CKMB, CKMBINDEX, TROPONINI in the last 168 hours. BNP (last  3 results) No results for input(s): PROBNP in the last 8760 hours. HbA1C: No results for input(s): HGBA1C in the last 72 hours. CBG: No results for input(s): GLUCAP in the last 168 hours. Lipid Profile: No results for input(s): CHOL, HDL, LDLCALC, TRIG, CHOLHDL, LDLDIRECT in the last  72 hours. Thyroid Function Tests: No results for input(s): TSH, T4TOTAL, FREET4, T3FREE, THYROIDAB in the last 72 hours. Anemia Panel: No results for input(s): VITAMINB12, FOLATE, FERRITIN, TIBC, IRON, RETICCTPCT in the last 72 hours. Urine analysis:    Component Value Date/Time   LABSPEC 1.010 12/13/2018 0941   BILIRUBINUR negative 12/13/2018 0941   BILIRUBINUR n 02/05/2016 0833   KETONESUR negative 12/13/2018 0941   PROTEINUR negative 12/13/2018 0941   PROTEINUR n 02/05/2016 0833   UROBILINOGEN negative 02/05/2016 0833   NITRITE Negative 12/13/2018 0941   NITRITE n 02/05/2016 0833   LEUKOCYTESUR Negative 12/13/2018 0941    Radiological Exams on Admission: CT Head Wo Contrast  Result Date: 01/07/2020 CLINICAL DATA:  Altered mental status. EXAM: CT HEAD WITHOUT CONTRAST TECHNIQUE: Contiguous axial images were obtained from the base of the skull through the vertex without intravenous contrast. COMPARISON:  03/24/2011 from [facility]: Brain: No evidence of acute infarction, hemorrhage, hydrocephalus, extra-axial collection, or intra-axial mass lesion/mass effect. Vascular:  No hyperdense vessel or other acute findings. Skull: No evidence of fracture. A small extra-axial well ossified mass is seen in the right frontal region measuring 7 mm in thickness, without significant change since previous study, consistent with benign meningioma or osteoma. Sinuses/Orbits:  No acute findings. Other: None. IMPRESSION: No acute intracranial findings. Stable sub-cm ossified extra-axial mass in the right frontal vertex, consistent with meningioma or osteoma. Electronically Signed   By: Marlaine Hind M.D.    On: 01/07/2020 12:33   DG Chest Port 1 View  Result Date: 01/07/2020 CLINICAL DATA:  Altered mental status. EXAM: PORTABLE CHEST 1 VIEW COMPARISON:  04/11/2006 FINDINGS: Borderline enlarged cardiac silhouette. Tortuous aorta. Clear lungs with normal vascularity. Unremarkable bones. IMPRESSION: No acute abnormality. Electronically Signed   By: Claudie Revering M.D.   On: 01/07/2020 11:42    EKG: Independently reviewed.  84 bpm, normal axis, normal intervals, sinus rhythm, no ST segment or T wave changes.  Assessment/Plan Principal Problem:   Encephalopathy acute Active Problems:   Mixed dyslipidemia   Essential hypertension   69 year old female who has hypertension and dyslipidemia, Over the last few months she has shown inappropriate behavior, fixating ideas and excessive worries.  She was found altered by her daughter this morning who lives out of town and has come to visit her.  She has hypotension blood pressure 83/52, her heart rate is 81, respiratory rate 11, oxygen saturation 100.  She has dry mucous membranes, otherwise her lungs were clear to auscultation bilaterally, heart S1-S2 present rhythmic, soft abdomen, no lower extremity edema, she does have bilateral superficial cuts in her wrists and forearms.  Sodium 141, potassium 3.6, chloride 104, bicarb 23, glucose 92, BUN 13, creatinine 0.86, AST 17, ALT 16, white count 6.9, hemoglobin 12.4, hematocrit 38.3, platelets 287.  Drug screen negative, salicylate less than 7, alcohol less than 10.  Head CT negative for acute changes.  Chest radiograph negative for infiltrates  Patient will be admitted to the hospital with a working diagnosis of acute metabolic/ toxic encephalopathy  1.  Acute metabolic encephalopathy.  Patient is nonfocal, her kidney function is normal, no elevation of liver enzymes, her drug screen is negative.  Her persistent hypotension may suggest overdose of antihypertensives.  Otherwise she has no evident access to controlled  substances.  Will continue close neuro checks every 4 hours, continue intravenous fluids with balance electrolyte solutions, fall precautions.  The cuts in her wrist and her behavioral change in are very concerning, will  call psychiatry for further evaluation.  If patient recovers her mentation she likely will need involuntary commitment for safety.   2.  Dyslipidemia.  Continue atorvastatin.  3.  Suspected depression, possible dementia.  Will add thiamine, folic acid, continue neurochecks every 4 hours, follow-up on psychiatry evaluation.  4.  Hypertension.  Currently patient hypotensive, hold on blood pressure medications.    DVT prophylaxis:  Enoxaparin  Code Status:  full  Family Communication: I spoke with patient's daughter at the bedside, we talked in detail about patient's condition, plan of care and prognosis and all questions were addressed.   Disposition Plan: med surg   Consults called: psychiatry   Admission status: observation.     Yasmin Bronaugh Gerome Apley MD [facility]   01/07/2020, 2:19 PM
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
## 2020-01-07 NOTE — Plan of Care (Signed)
  Problem: Education: Goal: Knowledge of General Education information will improve Description: Including pain rating scale, medication(s)/side effects and non-pharmacologic comfort measures Outcome: Not Progressing   Problem: Health Behavior/Discharge Planning: Goal: Ability to manage health-related needs will improve Outcome: Not Progressing   Problem: Clinical Measurements: Goal: Ability to maintain clinical measurements within normal limits will improve Outcome: Not Progressing Goal: Will remain free from infection Outcome: Not Progressing Goal: Diagnostic test results will improve Outcome: Not Progressing Goal: Respiratory complications will improve Outcome: Not Progressing Goal: Cardiovascular complication will be avoided Outcome: Not Progressing   Problem: Coping: Goal: Level of anxiety will decrease Outcome: Not Progressing   Problem: Elimination: Goal: Will not experience complications related to bowel motility Outcome: Not Progressing Goal: Will not experience complications related to urinary retention Outcome: Not Progressing   Problem: Pain Managment: Goal: General experience of comfort will improve Outcome: Not Progressing   Problem: Safety: Goal: Ability to remain free from injury will improve Outcome: Not Progressing   Problem: Skin Integrity: Goal: Risk for impaired skin integrity will decrease Outcome: Not Progressing

## 2020-01-08 ENCOUNTER — Ambulatory Visit: Payer: Medicare Other | Admitting: Medical

## 2020-01-08 ENCOUNTER — Observation Stay (HOSPITAL_COMMUNITY): Payer: Medicare Other

## 2020-01-08 ENCOUNTER — Inpatient Hospital Stay (HOSPITAL_COMMUNITY): Payer: Medicare Other

## 2020-01-08 ENCOUNTER — Telehealth: Payer: Self-pay | Admitting: Medical

## 2020-01-08 DIAGNOSIS — Z86011 Personal history of benign neoplasm of the brain: Secondary | ICD-10-CM | POA: Diagnosis not present

## 2020-01-08 DIAGNOSIS — Z79899 Other long term (current) drug therapy: Secondary | ICD-10-CM | POA: Diagnosis not present

## 2020-01-08 DIAGNOSIS — R4182 Altered mental status, unspecified: Secondary | ICD-10-CM | POA: Diagnosis present

## 2020-01-08 DIAGNOSIS — Z20822 Contact with and (suspected) exposure to covid-19: Secondary | ICD-10-CM | POA: Diagnosis present

## 2020-01-08 DIAGNOSIS — Z7982 Long term (current) use of aspirin: Secondary | ICD-10-CM | POA: Diagnosis not present

## 2020-01-08 DIAGNOSIS — R4189 Other symptoms and signs involving cognitive functions and awareness: Secondary | ICD-10-CM | POA: Diagnosis present

## 2020-01-08 DIAGNOSIS — Z781 Physical restraint status: Secondary | ICD-10-CM | POA: Diagnosis not present

## 2020-01-08 DIAGNOSIS — F0391 Unspecified dementia with behavioral disturbance: Secondary | ICD-10-CM | POA: Diagnosis present

## 2020-01-08 DIAGNOSIS — M5030 Other cervical disc degeneration, unspecified cervical region: Secondary | ICD-10-CM | POA: Diagnosis present

## 2020-01-08 DIAGNOSIS — Z8249 Family history of ischemic heart disease and other diseases of the circulatory system: Secondary | ICD-10-CM | POA: Diagnosis not present

## 2020-01-08 DIAGNOSIS — E782 Mixed hyperlipidemia: Secondary | ICD-10-CM | POA: Diagnosis present

## 2020-01-08 DIAGNOSIS — G9340 Encephalopathy, unspecified: Secondary | ICD-10-CM | POA: Diagnosis present

## 2020-01-08 DIAGNOSIS — Z87891 Personal history of nicotine dependence: Secondary | ICD-10-CM | POA: Diagnosis not present

## 2020-01-08 DIAGNOSIS — G9341 Metabolic encephalopathy: Secondary | ICD-10-CM | POA: Diagnosis present

## 2020-01-08 DIAGNOSIS — I10 Essential (primary) hypertension: Secondary | ICD-10-CM | POA: Diagnosis present

## 2020-01-08 DIAGNOSIS — Z86010 Personal history of colonic polyps: Secondary | ICD-10-CM | POA: Diagnosis not present

## 2020-01-08 DIAGNOSIS — I959 Hypotension, unspecified: Secondary | ICD-10-CM | POA: Diagnosis present

## 2020-01-08 LAB — BASIC METABOLIC PANEL
Anion gap: 11 (ref 5–15)
BUN: 15 mg/dL (ref 8–23)
CO2: 22 mmol/L (ref 22–32)
Calcium: 9.1 mg/dL (ref 8.9–10.3)
Chloride: 107 mmol/L (ref 98–111)
Creatinine, Ser: 1.04 mg/dL — ABNORMAL HIGH (ref 0.44–1.00)
GFR calc Af Amer: >60 mL/min (ref >60)
GFR calc non Af Amer: 55 mL/min — ABNORMAL LOW (ref >60)
Glucose, Bld: 120 mg/dL — ABNORMAL HIGH (ref 70–99)
Potassium: 3.2 mmol/L — ABNORMAL LOW (ref 3.5–5.1)
Sodium: 140 mmol/L (ref 135–145)

## 2020-01-08 LAB — URINALYSIS, ROUTINE W REFLEX MICROSCOPIC
Bilirubin Urine: NEGATIVE
Glucose, UA: NEGATIVE mg/dL
Ketones, ur: 20 mg/dL — AB
Leukocytes,Ua: NEGATIVE
Nitrite: NEGATIVE
Protein, ur: 30 mg/dL — AB
RBC / HPF: >50 RBC/hpf — ABNORMAL HIGH (ref 0–5)
Specific Gravity, Urine: 1.017 (ref 1.005–1.030)
pH: 5 (ref 5.0–8.0)

## 2020-01-08 LAB — CBC
HCT: 34.9 % — ABNORMAL LOW (ref 36.0–46.0)
Hemoglobin: 11.2 g/dL — ABNORMAL LOW (ref 12.0–15.0)
MCH: 31.6 pg (ref 26.0–34.0)
MCHC: 32.1 g/dL (ref 30.0–36.0)
MCV: 98.6 fL (ref 80.0–100.0)
Platelets: 269 10*3/uL (ref 150–400)
RBC: 3.54 MIL/uL — ABNORMAL LOW (ref 3.87–5.11)
RDW: 13 % (ref 11.5–15.5)
WBC: 6.3 10*3/uL (ref 4.0–10.5)
nRBC: 0 % (ref 0.0–0.2)

## 2020-01-08 LAB — BILIRUBIN, DIRECT: Bilirubin, Direct: 0.1 mg/dL (ref 0.0–0.2)

## 2020-01-08 MED ORDER — LORAZEPAM 2 MG/ML IJ SOLN
0.5000 mg | Freq: Four times a day (QID) | INTRAMUSCULAR | Status: DC | PRN
  Administered 2020-01-08 – 2020-01-09 (×2): 0.5 mg via INTRAVENOUS
  Filled 2020-01-08 (×2): qty 1

## 2020-01-08 MED ORDER — LORAZEPAM 2 MG/ML IJ SOLN
0.5000 mg | Freq: Once | INTRAMUSCULAR | Status: AC
  Administered 2020-01-08: 0.5 mg via INTRAVENOUS
  Filled 2020-01-08: qty 1

## 2020-01-08 MED ORDER — POTASSIUM CHLORIDE CRYS ER 20 MEQ PO TBCR
40.0000 meq | EXTENDED_RELEASE_TABLET | Freq: Two times a day (BID) | ORAL | Status: AC
  Administered 2020-01-08 – 2020-01-09 (×3): 40 meq via ORAL
  Filled 2020-01-08 (×4): qty 2

## 2020-01-08 NOTE — Consult Note (Signed)
Telepsych Consultation   Reason for Consult: "Depression and possible suicide" Referring Physician: Internal medicine Location of Patient: 1514-01 Location of Provider: [facility]  Patient Identification: Nancy Castro MRN:  [id number] Principal Diagnosis: Encephalopathy acute Diagnosis:  Principal Problem:   Encephalopathy acute Active Problems:   Mixed dyslipidemia   Essential hypertension   Total Time spent with patient: 15 minutes  Subjective:   Nancy Castro is a 69 y.o. female was evaluated via teleassessment.  She is awake, alert oriented to self only.  Thought blocking noted throughout assessment.  However denied suicidal or homicidal ideation.  Denies depression or depressive symptoms.  Patient appeared confused with questions.  As she states " this is my sister Jeannene Patella."  However daughter Rielyn Maisano was at bedside.  Reports this is a new onset of confusion.  Does report multiple stressors at home however denied that patient is ever been followed by psychiatry and/or therapy.  She denied suicidal or homicidal attempts/gestures.  Denies that patient has ever been admitted psychiatrically.  Denies illicit drug use or alcohol abuse.  Daughter [name] reported she is awaiting for neurology work-up and head CT results.  Case staffed with attending psychiatrist Dwyane Dee.  Consider reconsulting after neurology and medical clearance.  Support, encouragement and  reassurance was provided.  HPI: per admission assessment note:  Nancy Castro is a 69 y.o. female with medical history significant of hypertension and dyslipidemia.  She was brought by her daughter who noticed her to be confused and altered.Her daughter lives out of town and has been calling her over the phone regularly.  Over the last few months she has noticed her mother to have inappropriate responses, obsessive fixations and persistent worrying.Recently Mrs. Robie moved with her sister stating that she needed a  break from home.  When she heard that her daughter was coming to visit she moved back to her home.  Past Psychiatric History:  Risk to Self:   Risk to Others:   Prior Inpatient Therapy:   Prior Outpatient Therapy:    Past Medical History:  Past Medical History:  Diagnosis Date  . DDD (degenerative disc disease), cervical   . H/O echocardiogram 2007  . History of exercise stress test 2007  . Hyperlipidemia   . Hypertension   . Mixed dyslipidemia   . Osteoarthritis   . Syncope 2012   cardiac stress test   . Wears glasses   . Wears partial dentures    upper and lower    Past Surgical History:  Procedure Laterality Date  . COLONOSCOPY     2014, polyps, repeat 3 years  . COLONOSCOPY  07/14/2018   tubular adenoma polyps, repeat 3 years, Dr. Zenovia Jarred  . KNEE ARTHROSCOPY     right  . LIPOMA EXCISION     back x 2  . PARTIAL HYSTERECTOMY     fibroids; has both ovaries   Family History:  Family History  Problem Relation Age of Onset  . Cancer Mother        died of breast cancer  . Cancer Father        father lung cancer  . Cancer Sister        lung, mets to brain  . Cancer Brother        lung and stomach  . Stomach cancer Brother        died at age 71  . Hypertension Maternal Grandmother   . Cancer Brother  lung  . Diabetes Brother   . Heart disease Neg Hx   . Stroke Neg Hx   . Colon cancer Neg Hx   . Rectal cancer Neg Hx    Family Psychiatric  History:  Social History:  Social History   Substance and Sexual Activity  Alcohol Use No  . Alcohol/week: 0.0 standard drinks     Social History   Substance and Sexual Activity  Drug Use No    Social History   Socioeconomic History  . Marital status: Married    Spouse name: Not on file  . Number of children: Not on file  . Years of education: Not on file  . Highest education level: Not on file  Occupational History  . Not on file  Tobacco Use  . Smoking status: Former Smoker    Packs/day: 0.25     Years: 5.00    Pack years: 1.25    Quit date: 02/08/1987    Years since quitting: 32.9  . Smokeless tobacco: Former Systems developer    Types: Chew  . Tobacco comment: Stopped chewing years  ago.  Substance and Sexual Activity  . Alcohol use: No    Alcohol/week: 0.0 standard drinks  . Drug use: No  . Sexual activity: Not on file    Comment: 1986  Other Topics Concern  . 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  No grandchildren.   Sews - hobby.  12/2018   Social Determinants of Health   Financial Resource Strain:   . Difficulty of Paying Living Expenses: Not on file  Food Insecurity:   . Worried About Charity fundraiser in the Last Year: Not on file  . Ran Out of Food in the Last Year: Not on file  Transportation Needs:   . Lack of Transportation (Medical): Not on file  . Lack of Transportation (Non-Medical): Not on file  Physical Activity:   . Days of Exercise per Week: Not on file  . Minutes of Exercise per Session: Not on file  Stress:   . Feeling of Stress : Not on file  Social Connections:   . Frequency of Communication with Friends and Family: Not on file  . Frequency of Social Gatherings with Friends and Family: Not on file  . Attends Religious Services: Not on file  . Active Member of Clubs or Organizations: Not on file  . Attends Archivist Meetings: Not on file  . Marital Status: Not on file   Additional Social History:    Allergies:  No Known Allergies  Labs:  Results for orders placed or performed during the hospital encounter of 01/07/20 (from the past 48 hour(s))  CBC with Differential/Platelet     Status: Abnormal   Collection Time: 01/07/20 11:05 AM  Result Value Ref Range   WBC 6.9 4.0 - 10.5 K/uL   RBC 3.84 (L) 3.87 - 5.11 MIL/uL   Hemoglobin 12.4 12.0 - 15.0 g/dL   HCT 38.3 36.0 - 46.0 %   MCV 99.7 80.0 - 100.0 fL   MCH 32.3 26.0  - 34.0 pg   MCHC 32.4 30.0 - 36.0 g/dL   RDW 12.7 11.5 - 15.5 %   Platelets 287 150 - 400 K/uL   nRBC 0.0 0.0 - 0.2 %   Neutrophils Relative % 74 %   Neutro Abs 5.1 1.7 - 7.7 K/uL  Lymphocytes Relative 20 %   Lymphs Abs 1.4 0.7 - 4.0 K/uL   Monocytes Relative 6 %   Monocytes Absolute 0.4 0.1 - 1.0 K/uL   Eosinophils Relative 0 %   Eosinophils Absolute 0.0 0.0 - 0.5 K/uL   Basophils Relative 0 %   Basophils Absolute 0.0 0.0 - 0.1 K/uL   Immature Granulocytes 0 %   Abs Immature Granulocytes 0.03 0.00 - 0.07 K/uL    Comment: Performed at [facility], [place] [address] [place], [place] [address]  Comprehensive metabolic panel     Status: Abnormal   Collection Time: 01/07/20 11:05 AM  Result Value Ref Range   Sodium 141 135 - 145 mmol/L   Potassium 3.6 3.5 - 5.1 mmol/L   Chloride 104 98 - 111 mmol/L   CO2 23 22 - 32 mmol/L   Glucose, Bld 92 70 - 99 mg/dL    Comment: Glucose reference range applies only to samples taken after fasting for at least 8 hours.   BUN 13 8 - 23 mg/dL   Creatinine, Ser 0.86 0.44 - 1.00 mg/dL   Calcium 9.4 8.9 - 10.3 mg/dL   Total Protein 7.0 6.5 - 8.1 g/dL   Albumin 4.2 3.5 - 5.0 g/dL   AST 17 15 - 41 U/L   ALT 16 0 - 44 U/L   Alkaline Phosphatase 52 38 - 126 U/L   Total Bilirubin 8.6 (H) 0.3 - 1.2 mg/dL   GFR calc non Af Amer >60 >60 mL/min   GFR calc Af Amer >60 >60 mL/min   Anion gap 14 5 - 15    Comment: Performed at [facility], [place] [address], [place], [place] [address]  Acetaminophen level     Status: Abnormal   Collection Time: 01/07/20 11:05 AM  Result Value Ref Range   Acetaminophen (Tylenol), Serum <10 (L) 10 - 30 ug/mL    Comment: (NOTE) Therapeutic concentrations vary significantly. A range of 10-30 ug/mL  may be an effective concentration for many patients. However, some  are best treated at concentrations outside of this range. Acetaminophen concentrations >150 ug/mL at 4 hours after ingestion   and >50 ug/mL at 12 hours after ingestion are often associated with  toxic reactions. Performed at [facility] [place] [address], [place], [place] [id number]   Salicylate level     Status: Abnormal   Collection Time: 01/07/20 11:05 AM  Result Value Ref Range   Salicylate Lvl [id number] (L) 7.0 - 30.0 mg/dL    Comment: Performed at [facility], [place] [address] [place], [place] [address]  Ethanol     Status: None   Collection Time: 01/07/20 11:05 AM  Result Value Ref Range   Alcohol, Ethyl (B) <10 <10 mg/dL    Comment: (NOTE) Lowest detectable limit for serum alcohol is 10 mg/dL. For medical purposes only. Performed at [facility], [place] [address], [place], [place] [address]   Rapid urine drug screen (hospital performed)     Status: None   Collection Time: 01/07/20 11:05 AM  Result Value Ref Range   Opiates NONE DETECTED NONE DETECTED   Cocaine NONE DETECTED NONE DETECTED   Benzodiazepines NONE DETECTED NONE DETECTED   Amphetamines NONE DETECTED NONE DETECTED   Tetrahydrocannabinol NONE DETECTED NONE DETECTED   Barbiturates NONE DETECTED NONE DETECTED    Comment: (NOTE) DRUG SCREEN FOR MEDICAL PURPOSES ONLY.  IF CONFIRMATION IS NEEDED FOR ANY PURPOSE, NOTIFY LAB WITHIN 5 DAYS.  LOWEST DETECTABLE LIMITS FOR URINE DRUG SCREEN Drug Class                     Cutoff (ng/mL) Amphetamine and metabolites    1000 Barbiturate and metabolites    200 Benzodiazepine                 [id number] Tricyclics and metabolites     300 Opiates and metabolites        300 Cocaine and metabolites        300 THC                            50 Performed at [facility], [place] [address], [place], [place] [address]   Ammonia     Status: None   Collection Time: 01/07/20 11:05 AM  Result Value Ref Range   Ammonia 18 9 - 35 umol/L    Comment: Performed at [facility], [place] [address], [place], [place] [address]  TSH      Status: None   Collection Time: 01/07/20  1:36 PM  Result Value Ref Range   TSH 2.180 0.350 - 4.500 uIU/mL    Comment: Performed by a 3rd Generation assay with a functional sensitivity of <=0.01 uIU/mL. Performed at [facility], [place] [address], [place], [place] [address]   SARS CORONAVIRUS 2 (TAT 6-24 HRS) Nasopharyngeal Nasopharyngeal Swab     Status: None   Collection Time: 01/07/20  1:38 PM   Specimen: Nasopharyngeal Swab  Result Value Ref Range   SARS Coronavirus 2 NEGATIVE NEGATIVE    Comment: (NOTE) SARS-CoV-2 target nucleic acids are NOT DETECTED. The SARS-CoV-2 RNA is generally detectable in upper and lower respiratory specimens during the acute phase of infection. Negative results do not preclude SARS-CoV-2 infection, do not rule out co-infections with other pathogens, and should not be used as the sole basis for treatment or other patient management decisions. Negative results must be combined with clinical observations, patient history, and epidemiological information. The expected result is Negative. Fact Sheet for Patients: SugarRoll.be Fact Sheet for Healthcare Providers: https://www.woods-mathews.com/ This test is not yet approved or cleared by the [place] FDA and  has been authorized for detection and/or diagnosis of SARS-CoV-2 by FDA under an Emergency Use Authorization (EUA). This EUA will remain  in effect (meaning this test can be used) for the duration of the COVID-19 declaration under Section 56 4(b)(1) of the Act, 21 U.S.C. section 360bbb-3(b)(1), unless the authorization is terminated or revoked sooner. Performed at [facility], [place] [address] [place], [place] [address]   HIV Antibody (routine testing w rflx)     Status: None   Collection Time: 01/07/20  3:30 PM  Result Value Ref Range   HIV Screen 4th Generation wRfx NON REACTIVE NON REACTIVE    Comment: Performed at [facility], [place] [address] [place], [place] [address]  Glucose, capillary     Status: None   Collection Time: 01/07/20  9:26 PM  Result Value Ref Range   Glucose-Capillary 74 70 - 99 mg/dL    Comment: Glucose reference range applies only to samples taken after fasting for at least 8 hours.  Urinalysis, Routine w reflex microscopic     Status: Abnormal   Collection Time: 01/08/20  2:40 AM  Result Value Ref Range   Color, Urine YELLOW YELLOW   APPearance CLOUDY (A) CLEAR  Specific Gravity, Urine 1.017 1.005 - 1.030   pH 5.0 5.0 - 8.0   Glucose, UA NEGATIVE NEGATIVE mg/dL   Hgb urine dipstick LARGE (A) NEGATIVE   Bilirubin Urine NEGATIVE NEGATIVE   Ketones, ur 20 (A) NEGATIVE mg/dL   Protein, ur 30 (A) NEGATIVE mg/dL   Nitrite NEGATIVE NEGATIVE   Leukocytes,Ua NEGATIVE NEGATIVE   RBC / HPF >50 (H) 0 - 5 RBC/hpf   WBC, UA 11-20 0 - 5 WBC/hpf   Bacteria, UA RARE (A) NONE SEEN   Squamous Epithelial / LPF 0-5 0 - 5   Mucus PRESENT    Ca Oxalate Crys, UA PRESENT     Comment: Performed at [facility], [place] [address], [place], [place] [id number]  Basic metabolic panel     Status: Abnormal   Collection Time: 01/08/20  6:03 AM  Result Value Ref Range   Sodium 140 135 - 145 mmol/L   Potassium 3.2 (L) 3.5 - 5.1 mmol/L   Chloride 107 98 - 111 mmol/L   CO2 22 22 - 32 mmol/L   Glucose, Bld 120 (H) 70 - 99 mg/dL    Comment: Glucose reference range applies only to samples taken after fasting for at least 8 hours.   BUN 15 8 - 23 mg/dL   Creatinine, Ser 1.04 (H) 0.44 - 1.00 mg/dL   Calcium 9.1 8.9 - 10.3 mg/dL   GFR calc non Af Amer 55 (L) >60 mL/min   GFR calc Af Amer >60 >60 mL/min   Anion gap 11 5 - 15    Comment: Performed at [facility], [place] [address], [place], [place] [address]  CBC     Status: Abnormal   Collection Time: 01/08/20  6:03 AM  Result Value Ref Range   WBC 6.3 4.0 - 10.5 K/uL   RBC 3.54 (L) 3.87 - 5.11 MIL/uL   Hemoglobin 11.2 (L)  12.0 - 15.0 g/dL   HCT 34.9 (L) 36.0 - 46.0 %   MCV 98.6 80.0 - 100.0 fL   MCH 31.6 26.0 - 34.0 pg   MCHC 32.1 30.0 - 36.0 g/dL   RDW 13.0 11.5 - 15.5 %   Platelets 269 150 - 400 K/uL   nRBC 0.0 0.0 - 0.2 %    Comment: Performed at [facility], [place] [address] [place], [place] [address]    Medications:  Current Facility-Administered Medications  Medication Dose Route Frequency Provider Last Rate Last Admin  . acetaminophen (TYLENOL) tablet 650 mg  650 mg Oral Q6H PRN Arrien, Jimmy Picket, MD       Or  . acetaminophen (TYLENOL) suppository 650 mg  650 mg Rectal Q6H PRN Arrien, Jimmy Picket, MD      . aspirin chewable tablet 81 mg  81 mg Oral Daily Tawni Millers, MD   81 mg at 01/08/20 1109  . atorvastatin (LIPITOR) tablet 40 mg  40 mg Oral q1800 Arrien, Jimmy Picket, MD      . dextrose 5 % in lactated ringers infusion   Intravenous Continuous Arrien, Jimmy Picket, MD 100 mL/hr at 01/08/20 0521 New Bag at 01/08/20 0521  . enoxaparin (LOVENOX) injection 40 mg  40 mg Subcutaneous Q24H Tawni Millers, MD   40 mg at 01/07/20 2322  . folic acid (FOLVITE) tablet 1 mg  1 mg Oral Daily Arrien, Jimmy Picket, MD   1 mg at 01/08/20 1109  . multivitamin with minerals tablet 1 tablet  1 tablet Oral Daily  Arrien, Jimmy Picket, MD   1 tablet at 01/08/20 1109  . ondansetron (ZOFRAN) tablet 4 mg  4 mg Oral Q6H PRN Arrien, Jimmy Picket, MD       Or  . ondansetron [facility]) injection 4 mg  4 mg Intravenous Q6H PRN Arrien, Jimmy Picket, MD      . thiamine tablet 100 mg  100 mg Oral Daily Arrien, Jimmy Picket, MD   100 mg at 01/08/20 1109    Musculoskeletal:   Psychiatric Specialty Exam: Physical Exam  Neurological: She is alert.  Psychiatric: She has a normal mood and affect.    Review of Systems  Psychiatric/Behavioral: Positive for confusion.  All other systems reviewed and are negative.   Blood pressure 121/62, pulse 81,  temperature 98.2 F (36.8 C), resp. rate 16, height 5\' 1"  (1.549 m), weight 60.9 kg, SpO2 97 %.Body mass index is 25.36 kg/m.  General Appearance: Casual  Eye Contact:  Fair  Speech:  Blocked  Volume:  Decreased  Mood:  flat, blunted  Affect:  Flat  Thought Process:  Coherent  Orientation:  Other:  Person only  Thought Content:  NA  Suicidal Thoughts:  No  Homicidal Thoughts:  No  Memory:  Immediate;   Poor Recent;   Poor  Judgement:  NA  Insight:  NA  Psychomotor Activity:  NA  Concentration:  Concentration: Poor  Recall:  Poor  Fund of Knowledge:  Poor  Language:  Poor  Akathisia:  No  Handed:  Right  AIMS (if indicated):     Assets:  Communication Skills Desire for Improvement Physical Health Social Support  ADL's:  Intact  Cognition:  Impaired,  Mild  Sleep:      -Consider reconsulting psychiatry after medical clearance and neurology work-up  Disposition: No evidence of imminent risk to self or others at present.   Supportive therapy provided about ongoing stressors.  This service was provided via telemedicine using a 2-way, interactive audio and video technology.  Names of all persons participating in this telemedicine service and their role in this encounter. Name: Kary Leason Role: patient   Name: T.Margaret Staggs Role: NP          Derrill [place], NP 01/08/2020 12:58 PM

## 2020-01-08 NOTE — Significant Event (Signed)
Rapid Response Event Note  Overview: Time Called: 2120 Arrival Time: 2125 Event Type: MEWS, Other (Comment)  Initial Focused Assessment: RRT called to room for elevated MEWS score, rapid heart rate, and bizarre behavior. Patient is non-verbal;  responds to painful stimuli this is how patient presented to the emergency room. Patient did exhibit cross eyed  in both eyes bilaterally that returned to normal. Also noted some twitching with the hands and upper extremities that lasted approximately 30 seconds.    Interventions: EKG    Plan of Care (if not transferred): Primary nurse to continue to monitor and notify [facility] NP if patient has another episode of the strabismus and twitching.   Event Summary: Name of Physician Notified: Blount NP at 2135    Outcome: Stayed in room and stabalized  Event End Time: [place] Reggie Bise

## 2020-01-08 NOTE — Progress Notes (Signed)
PROGRESS NOTE    Nancy Castro  [id number] DOB: [birth year] DOA: 01/07/2020 PCP: Carlena Hurl, PA-C    Brief Narrative:  69 year old female with history of hypertension dyslipidemia.  Probably has history of dementia, she has an MRI 5 years ago that was indicated for memory loss?  Her daughter brought her to the emergency room because patient was more confused and altered.  Apparently, her daughter went to see her, she would not open her door.  When daughter forced door open and went inside patient was found hyperreactive and combative and she was not talking. In the emergency room, patient was combative but nonverbal.  Initial blood pressures are low 83/52.  CT head was normal.  Patient had no focal deficits.  IV fluids were given and admitted.   Assessment & Plan:   Principal Problem:   Encephalopathy acute Active Problems:   Mixed dyslipidemia   Essential hypertension   Altered mental status  Acute metabolic encephalopathy in the setting of probable underlying dementia and behavioral disturbances: No evidence of localized or systemic infection.  Chest x-ray normal.  Urinalysis normal. No focal deficit, CT head and MRI of the brain is essentially normal. Drug screen, ammonia levels and urea are normal.  TSH normal.  Alcohol level, salicylate and Tylenol levels were normal. Underlying dementia/possible depression. Continue monitoring.  Fall precautions.  Delirium precautions. Work with PT OT. Psychiatry consultation.  Essential hypertension: Hypotensive on arrival.  Discontinue all antihypertensives until clinical response.  Hyperlipidemia: On a statin.  Continued.  LFTs normal.  Patient continues to have poor response and unable to mobilize.  She will need ongoing hospitalization treatment and investigations.  She is not ready to be discharged and not a safe discharge home.  DVT prophylaxis: Lovenox subcu Code Status: Full code Family Communication: Daughter on the  phone Disposition Plan: patient is from home. Anticipated DC to skilled nursing facility, Barriers to discharge encephalopathic   Consultants:   Psychiatry  Procedures:   None  Antimicrobials:   None   Subjective: Patient seen and examined in the morning rounds.  Was combative and given soft restraints overnight.  Also received a dose of Ativan. Patient would not communicate or respond to me.   Objective: Vitals:   01/08/20 0218 01/08/20 0322 01/08/20 0628 01/08/20 1108  BP: (!) 143/59 131/63 137/64 121/62  Pulse: 94 82 83 81  Resp: 20 17 18 16   Temp: 97.6 F (36.4 C) 98 F (36.7 C) 97.7 F (36.5 C) 98.2 F (36.8 C)  TempSrc: Axillary Axillary Axillary   SpO2: 100% 97% 100% 97%  Weight:      Height:        Intake/Output Summary (Last 24 hours) at 01/08/2020 1304 Last data filed at 01/08/2020 0240 Gross per 24 hour  Intake 372.81 ml  Output 1100 ml  Net -727.19 ml   Filed Weights   01/07/20 1104  Weight: 60.9 kg    Examination:  General exam: Appears calm and quiet.  Not in any distress. Respiratory system: Clear to auscultation. Respiratory effort normal.  No added sounds. Cardiovascular system: S1 & S2 heard, RRR. No JVD, murmurs, rubs, gallops or clicks. No pedal edema. Gastrointestinal system: Abdomen is nondistended, soft and nontender. No organomegaly or masses felt. Normal bowel sounds heard. Central nervous system: Sleepy, in active, not following commands and not talking.  Moves all 4 extremities equally on noxious stimuli.  Pupils are bilaterally equal and reactive. Skin: No rashes, lesions or ulcers   Data Reviewed:  I have personally reviewed following labs and imaging studies  CBC: Recent Labs  Lab 01/07/20 1105 01/08/20 0603  WBC 6.9 6.3  NEUTROABS 5.1  --   HGB 12.4 11.2*  HCT 38.3 34.9*  MCV 99.7 98.6  PLT 287 [id number]   Basic Metabolic Panel: Recent Labs  Lab 01/07/20 1105 01/08/20 0603  NA 141 140  K 3.6 3.2*  CL 104 107  CO2  23 22  GLUCOSE 92 120*  BUN 13 15  CREATININE 0.86 1.04*  CALCIUM 9.4 9.1   GFR: Estimated Creatinine Clearance: 42.7 mL/min (A) (by C-G formula based on SCr of 1.04 mg/dL (H)). Liver Function Tests: Recent Labs  Lab 01/07/20 1105  AST 17  ALT 16  ALKPHOS 52  BILITOT 8.6*  PROT 7.0  ALBUMIN 4.2   No results for input(s): LIPASE, AMYLASE in the last 168 hours. Recent Labs  Lab 01/07/20 1105  AMMONIA 18   Coagulation Profile: No results for input(s): INR, PROTIME in the last 168 hours. Cardiac Enzymes: No results for input(s): CKTOTAL, CKMB, CKMBINDEX, TROPONINI in the last 168 hours. BNP (last 3 results) No results for input(s): PROBNP in the last 8760 hours. HbA1C: No results for input(s): HGBA1C in the last 72 hours. CBG: Recent Labs  Lab 01/07/20 2126  GLUCAP 74   Lipid Profile: No results for input(s): CHOL, HDL, LDLCALC, TRIG, CHOLHDL, LDLDIRECT in the last 72 hours. Thyroid Function Tests: Recent Labs    01/07/20 1336  TSH 2.180   Anemia Panel: No results for input(s): VITAMINB12, FOLATE, FERRITIN, TIBC, IRON, RETICCTPCT in the last 72 hours. Sepsis Labs: No results for input(s): PROCALCITON, LATICACIDVEN in the last 168 hours.  Recent Results (from the past 240 hour(s))  SARS CORONAVIRUS 2 (TAT 6-24 HRS) Nasopharyngeal Nasopharyngeal Swab     Status: None   Collection Time: 01/07/20  1:38 PM   Specimen: Nasopharyngeal Swab  Result Value Ref Range Status   SARS Coronavirus 2 NEGATIVE NEGATIVE Final    Comment: (NOTE) SARS-CoV-2 target nucleic acids are NOT DETECTED. The SARS-CoV-2 RNA is generally detectable in upper and lower respiratory specimens during the acute phase of infection. Negative results do not preclude SARS-CoV-2 infection, do not rule out co-infections with other pathogens, and should not be used as the sole basis for treatment or other patient management decisions. Negative results must be combined with clinical observations,  patient history, and epidemiological information. The expected result is Negative. Fact Sheet for Patients: SugarRoll.be Fact Sheet for Healthcare Providers: https://www.woods-mathews.com/ This test is not yet approved or cleared by the [place] FDA and  has been authorized for detection and/or diagnosis of SARS-CoV-2 by FDA under an Emergency Use Authorization (EUA). This EUA will remain  in effect (meaning this test can be used) for the duration of the COVID-19 declaration under Section 56 4(b)(1) of the Act, 21 U.S.C. section 360bbb-3(b)(1), unless the authorization is terminated or revoked sooner. Performed at [facility], [place] [address], [place], [place] [address]          Radiology Studies: CT Head Wo Contrast  Result Date: 01/07/2020 CLINICAL DATA:  Altered mental status. EXAM: CT HEAD WITHOUT CONTRAST TECHNIQUE: Contiguous axial images were obtained from the base of the skull through the vertex without intravenous contrast. COMPARISON:  03/24/2011 from [place]: Brain: No evidence of acute infarction, hemorrhage, hydrocephalus, extra-axial collection, or intra-axial mass lesion/mass effect. Vascular:  No hyperdense vessel or other acute findings. Skull: No evidence of fracture. A  small extra-axial well ossified mass is seen in the right frontal region measuring 7 mm in thickness, without significant change since previous study, consistent with benign meningioma or osteoma. Sinuses/Orbits:  No acute findings. Other: None. IMPRESSION: No acute intracranial findings. Stable sub-cm ossified extra-axial mass in the right frontal vertex, consistent with meningioma or osteoma. Electronically Signed   By: Marlaine Hind M.D.   On: 01/07/2020 12:33   MR BRAIN WO CONTRAST  Result Date: 01/08/2020 CLINICAL DATA:  Altered mental status. Dementia EXAM: MRI HEAD WITHOUT CONTRAST TECHNIQUE: Multiplanar, multiecho pulse  sequences of the brain and surrounding structures were obtained without intravenous contrast. COMPARISON:  MRI of the brain December 18, 2014 FINDINGS: Brain: No acute infarction, hemorrhage, hydrocephalus, extra-axial collection or mass lesion. No significant volume loss for white matter disease. Vascular: Normal flow voids. Skull and upper cervical spine: Normal marrow signal. Sinuses/Orbits: Negative. Other: No significant interval change of the 8 mm extra-axial ossified lesion in the right frontal region, likely representing a meningioma. IMPRESSION: No acute intracranial abnormality. Unremarkable MRI of the brain for age. Electronically Signed   By: Pedro Earls M.D.   On: 01/08/2020 12:00   DG Chest Port 1 View  Result Date: 01/07/2020 CLINICAL DATA:  Altered mental status. EXAM: PORTABLE CHEST 1 VIEW COMPARISON:  04/11/2006 FINDINGS: Borderline enlarged cardiac silhouette. Tortuous aorta. Clear lungs with normal vascularity. Unremarkable bones. IMPRESSION: No acute abnormality. Electronically Signed   By: Claudie Revering M.D.   On: 01/07/2020 11:42        Scheduled Meds: . aspirin  81 mg Oral Daily  . atorvastatin  40 mg Oral q1800  . enoxaparin (LOVENOX) injection  40 mg Subcutaneous Q24H  . folic acid  1 mg Oral Daily  . multivitamin with minerals  1 tablet Oral Daily  . thiamine  100 mg Oral Daily   Continuous Infusions: . dextrose 5% lactated ringers 100 mL/hr at 01/08/20 0521     LOS: 0 days    Time spent: 30 minutes    Barb Merino, MD [facility] Pager [phone]

## 2020-01-08 NOTE — Telephone Encounter (Signed)
I called and spoke to daughter about concerns.  Discussed her mother's recent visit here after covid vaccine and last visit for physical last year.  Discussed prior neurology consult for memory a few ago.    Ms. Einbinder is in the hospital currently for acute mental status change

## 2020-01-08 NOTE — Progress Notes (Addendum)
I met patient's daughter Ms. Jolee Ewing at the bed side.  We discussed details about Nancy Castro.  She was more awake and able to have some conversation.  Patient notes that she is at [facility].  She does not know the date.  She does not know why she is here.  She could tell me where she lives. Patient has flat affect, blunted response, delayed response otherwise no focal neurological deficits. MRI of the brain is essentially normal, showing stable size meningioma with no changes. No other metabolic abnormalities. LFTs show normal transaminases, bilirubin of 8?.  Ammonia normal.  Tylenol levels were normal.  UDS was normal. Daughter reported that patient was independent and does her all chores by herself except last few months she has been more obsessed and focused on household things.  We need to evaluate for her abnormal LFTs, will check right upper quadrant ultrasound.  Check direct bilirubin.  Recheck levels after hydration.  Until then all fall precautions, delirium precautions and continuous neurological monitoring.  Her isolated rise in bilirubin does not make any clinical sense, direct bili is normal.  That amount of direct bilirubin with normal transaminases and no evidence of hemolysis, possible lab error?  We will recheck in the morning.

## 2020-01-08 NOTE — Progress Notes (Signed)
   01/07/20 2114  Vitals  BP (!) 156/70  MAP (mmHg) 93  BP Method Automatic  Pulse Rate (!) 151  Pulse Rate Source Monitor  Oxygen Therapy  SpO2 99 %  MEWS Score  MEWS Temp 0  MEWS Systolic 0  MEWS Pulse 3  MEWS RR 0  MEWS LOC 2  MEWS Score 5  MEWS Score Color Red  MEWS Assessment  Is this an acute change? Yes  MEWS guidelines implemented *See [place] nurse and hospitalist notified. Red MEWS protocol initiated

## 2020-01-08 NOTE — Telephone Encounter (Signed)
Pt called and would like to talk you pt Nancy Castro in the hosp and she would like to talk to you in regards to that  She can be reached at [phone]

## 2020-01-09 DIAGNOSIS — G9340 Encephalopathy, unspecified: Secondary | ICD-10-CM

## 2020-01-09 DIAGNOSIS — R4182 Altered mental status, unspecified: Secondary | ICD-10-CM

## 2020-01-09 LAB — CBC WITH DIFFERENTIAL/PLATELET
Abs Immature Granulocytes: 0.01 10*3/uL (ref 0.00–0.07)
Basophils Absolute: 0 10*3/uL (ref 0.0–0.1)
Basophils Relative: 0 %
Eosinophils Absolute: 0 10*3/uL (ref 0.0–0.5)
Eosinophils Relative: 0 %
HCT: 37.1 % (ref 36.0–46.0)
Hemoglobin: 12 g/dL (ref 12.0–15.0)
Immature Granulocytes: 0 %
Lymphocytes Relative: 10 %
Lymphs Abs: 0.5 10*3/uL — ABNORMAL LOW (ref 0.7–4.0)
MCH: 31.9 pg (ref 26.0–34.0)
MCHC: 32.3 g/dL (ref 30.0–36.0)
MCV: 98.7 fL (ref 80.0–100.0)
Monocytes Absolute: 0.5 10*3/uL (ref 0.1–1.0)
Monocytes Relative: 9 %
Neutro Abs: 4.4 10*3/uL (ref 1.7–7.7)
Neutrophils Relative %: 81 %
Platelets: 279 10*3/uL (ref 150–400)
RBC: 3.76 MIL/uL — ABNORMAL LOW (ref 3.87–5.11)
RDW: 12.9 % (ref 11.5–15.5)
WBC: 5.4 10*3/uL (ref 4.0–10.5)
nRBC: 0 % (ref 0.0–0.2)

## 2020-01-09 LAB — MAGNESIUM: Magnesium: 1.9 mg/dL (ref 1.7–2.4)

## 2020-01-09 LAB — COMPREHENSIVE METABOLIC PANEL
ALT: 26 U/L (ref 0–44)
AST: 34 U/L (ref 15–41)
Albumin: 3.6 g/dL (ref 3.5–5.0)
Alkaline Phosphatase: 51 U/L (ref 38–126)
Anion gap: 9 (ref 5–15)
BUN: 13 mg/dL (ref 8–23)
CO2: 21 mmol/L — ABNORMAL LOW (ref 22–32)
Calcium: 9.3 mg/dL (ref 8.9–10.3)
Chloride: 105 mmol/L (ref 98–111)
Creatinine, Ser: 0.93 mg/dL (ref 0.44–1.00)
GFR calc Af Amer: >60 mL/min (ref >60)
GFR calc non Af Amer: >60 mL/min (ref >60)
Glucose, Bld: 104 mg/dL — ABNORMAL HIGH (ref 70–99)
Potassium: 3.8 mmol/L (ref 3.5–5.1)
Sodium: 135 mmol/L (ref 135–145)
Total Bilirubin: 8.5 mg/dL — ABNORMAL HIGH (ref 0.3–1.2)
Total Protein: 5.9 g/dL — ABNORMAL LOW (ref 6.5–8.1)

## 2020-01-09 LAB — URINE CULTURE: Culture: NO GROWTH

## 2020-01-09 LAB — HEPATITIS PANEL, ACUTE
HCV Ab: NONREACTIVE
Hep A IgM: NONREACTIVE
Hep B C IgM: NONREACTIVE
Hepatitis B Surface Ag: NONREACTIVE

## 2020-01-09 LAB — PHOSPHORUS: Phosphorus: 2.6 mg/dL (ref 2.5–4.6)

## 2020-01-09 NOTE — Progress Notes (Signed)
Pt's family called pt to talk about having EEG performed. Pt is now agreeing to have EEG test performed. Called EEG Tech back to advise, per [employer] will try again for EEG tomorrow. Pt notified.

## 2020-01-09 NOTE — Progress Notes (Signed)
PROGRESS NOTE    Nancy Castro  [id number] DOB: Dec 05, 1950 DOA: 01/07/2020 PCP: Carlena Hurl, PA-C    Brief Narrative:  69 year old female with history of hypertension and dyslipidemia.  Patient does have history of some memory issues in the past, however she is very functional, lives independent and able to carry out all her ADLs.  Only chronic issues was for last few months he was being noted to be obsessive about few things at home. Her daughter brought her to the emergency room because patient was more confused and altered.  Apparently, her daughter went to see her, she would not open her door.  When daughter forced door open and went inside patient was found hyporeactive and combative and she was not talking.  In the emergency room, patient was combative and nonverbal.  Initial blood pressures are low 83/52.  CT head was normal.  Patient had no focal deficits.  IV fluids were given and admitted.  Patient remained with delayed response and gradually improved over last 24 hours.  Her thought process and speech fluency gradually improved.  Patient suffered some acute neurological episode, cause unknown.   Assessment & Plan:   Principal Problem:   Encephalopathy acute Active Problems:   Mixed dyslipidemia   Essential hypertension   Altered mental status  Acute metabolic encephalopathy with no obvious h/o neurological issues, seizure or any psychiatric disorder: No evidence of localized or systemic infection.  Chest x-ray normal.  Urinalysis abnormal but presence of bilirubin.  Cultures pending. No focal deficit, CT head and MRI of the brain is essentially normal. Drug screen, ammonia levels and urea are normal.  TSH normal.  Alcohol level, salicylate and Tylenol levels were normal. Continue monitoring.  Fall precautions.  Delirium precautions. Work with PT OT. Psychiatry consultation, patient is mostly with clear mentation today. Questionable seizure?  EEG today.  Called and  discussed case with neurology for consultation.  Essential hypertension: Hypotensive on arrival.  Antihypertensives on hold until clinical response.  Hyperlipidemia: On a statin.  Continued.  LFTs normal except bilirubin.  Abnormal indirect bilirubin: Right upper quadrant ultrasound essentially normal.  Her transaminases are normal.  CBC with differential with no evidence of hemolysis.  Will discuss with gastroenterology if we need more investigations. We will continue to monitor.  Advance activities.  Work with PT OT. Neurology and gastroenterology consultations.  DVT prophylaxis: Lovenox subcu Code Status: Full code Family Communication: Daughter on the phone Disposition Plan: patient is from home. Anticipated DC to home.  Barriers to discharge encephalopathy work-up pending.   Consultants:   Psychiatry  Procedures:   None  Antimicrobials:   None   Subjective: Patient seen and examined.  No overnight events.  As compared to all day yesterday, patient is mostly oriented x2, she is able to answer questions, fluent speech.  She is not sure what happened to her.  She does not recall any events leading to hospitalization.   Objective: Vitals:   01/08/20 1108 01/08/20 1632 01/08/20 2056 01/09/20 0412  BP: 121/62 (!) 145/81 (!) 133/93 (!) 145/78  Pulse: 81 85 85 85  Resp: 16 16 17 17   Temp: 98.2 F (36.8 C) 98.2 F (36.8 C) 98.1 F (36.7 C) 98 F (36.7 C)  TempSrc:  Axillary Oral Oral  SpO2: 97% 100% 96% 100%  Weight:      Height:        Intake/Output Summary (Last 24 hours) at 01/09/2020 1237 Last data filed at 01/09/2020 0520 Gross per 24  hour  Intake --  Output 2380 ml  Net -2380 ml   Filed Weights   01/07/20 1104  Weight: 60.9 kg    Examination:  General exam: Today patient looks comfortable and pleasant. Respiratory system: Clear to auscultation. Respiratory effort normal.  No added sounds. Cardiovascular system: S1 & S2 heard, RRR. No JVD, murmurs,  rubs, gallops or clicks. No pedal edema. Gastrointestinal system: Abdomen is nondistended, soft and nontender. No organomegaly or masses felt. Normal bowel sounds heard. Central nervous system: Patient is alert oriented x2-3.  Speech is fluent.  Moves all 4 extremities equally on noxious stimuli.  Pupils are bilaterally equal and reactive. Skin: No rashes, lesions or ulcers   Data Reviewed: I have personally reviewed following labs and imaging studies  CBC: Recent Labs  Lab 01/07/20 1105 01/08/20 0603 01/09/20 0648  WBC 6.9 6.3 5.4  NEUTROABS 5.1  --  4.4  HGB 12.4 11.2* 12.0  HCT 38.3 34.9* 37.1  MCV 99.7 98.6 98.7  PLT 287 269 [id number]   Basic Metabolic Panel: Recent Labs  Lab 01/07/20 1105 01/08/20 0603 01/09/20 0648  NA 141 140 135  K 3.6 3.2* 3.8  CL 104 107 105  CO2 23 22 21*  GLUCOSE 92 120* 104*  BUN 13 15 13   CREATININE 0.86 1.04* 0.93  CALCIUM 9.4 9.1 9.3  MG  --   --  1.9  PHOS  --   --  2.6   GFR: Estimated Creatinine Clearance: 47.8 mL/min (by C-G formula based on SCr of 0.93 mg/dL). Liver Function Tests: Recent Labs  Lab 01/07/20 1105 01/09/20 0648  AST 17 34  ALT 16 26  ALKPHOS 52 51  BILITOT 8.6* 8.5*  PROT 7.0 5.9*  ALBUMIN 4.2 3.6   No results for input(s): LIPASE, AMYLASE in the last 168 hours. Recent Labs  Lab 01/07/20 1105  AMMONIA 18   Coagulation Profile: No results for input(s): INR, PROTIME in the last 168 hours. Cardiac Enzymes: No results for input(s): CKTOTAL, CKMB, CKMBINDEX, TROPONINI in the last 168 hours. BNP (last 3 results) No results for input(s): PROBNP in the last 8760 hours. HbA1C: No results for input(s): HGBA1C in the last 72 hours. CBG: Recent Labs  Lab 01/07/20 2126  GLUCAP 74   Lipid Profile: No results for input(s): CHOL, HDL, LDLCALC, TRIG, CHOLHDL, LDLDIRECT in the last 72 hours. Thyroid Function Tests: Recent Labs    01/07/20 1336  TSH 2.180   Anemia Panel: No results for input(s): VITAMINB12,  FOLATE, FERRITIN, TIBC, IRON, RETICCTPCT in the last 72 hours. Sepsis Labs: No results for input(s): PROCALCITON, LATICACIDVEN in the last 168 hours.  Recent Results (from the past 240 hour(s))  SARS CORONAVIRUS 2 (TAT 6-24 HRS) Nasopharyngeal Nasopharyngeal Swab     Status: None   Collection Time: 01/07/20  1:38 PM   Specimen: Nasopharyngeal Swab  Result Value Ref Range Status   SARS Coronavirus 2 NEGATIVE NEGATIVE Final    Comment: (NOTE) SARS-CoV-2 target nucleic acids are NOT DETECTED. The SARS-CoV-2 RNA is generally detectable in upper and lower respiratory specimens during the acute phase of infection. Negative results do not preclude SARS-CoV-2 infection, do not rule out co-infections with other pathogens, and should not be used as the sole basis for treatment or other patient management decisions. Negative results must be combined with clinical observations, patient history, and epidemiological information. The expected result is Negative. Fact Sheet for Patients: SugarRoll.be Fact Sheet for Healthcare Providers: https://www.woods-mathews.com/ This test is not yet approved or  cleared by the [place] and  has been authorized for detection and/or diagnosis of SARS-CoV-2 by FDA under an Emergency Use Authorization (EUA). This EUA will remain  in effect (meaning this test can be used) for the duration of the COVID-19 declaration under Section 56 4(b)(1) of the Act, 21 U.S.C. section 360bbb-3(b)(1), unless the authorization is terminated or revoked sooner. Performed at [facility], [place] [address], [place], [place] [address]          Radiology Studies: MR BRAIN WO CONTRAST  Result Date: 01/08/2020 CLINICAL DATA:  Altered mental status. Dementia EXAM: MRI HEAD WITHOUT CONTRAST TECHNIQUE: Multiplanar, multiecho pulse sequences of the brain and surrounding structures were obtained without intravenous contrast.  COMPARISON:  MRI of the brain December 18, 2014 FINDINGS: Brain: No acute infarction, hemorrhage, hydrocephalus, extra-axial collection or mass lesion. No significant volume loss for white matter disease. Vascular: Normal flow voids. Skull and upper cervical spine: Normal marrow signal. Sinuses/Orbits: Negative. Other: No significant interval change of the 8 mm extra-axial ossified lesion in the right frontal region, likely representing a meningioma. IMPRESSION: No acute intracranial abnormality. Unremarkable MRI of the brain for age. Electronically Signed   By: Pedro Earls M.D.   On: 01/08/2020 12:00   US Abdomen Limited  Result Date: 01/08/2020 CLINICAL DATA:  Abnormal LFTs EXAM: ULTRASOUND ABDOMEN LIMITED RIGHT UPPER QUADRANT COMPARISON:  None. FINDINGS: Gallbladder: No gallstones or wall thickening visualized. No sonographic Murphy sign noted by sonographer. Mild pericholecystic fluid is noted. This may be related underlying mild ascites. Common bile duct: Diameter: 3.4 mm. Liver: No focal lesion identified. Within normal limits in parenchymal echogenicity. Portal vein is patent on color Doppler imaging with normal direction of blood flow towards the liver. Other: Mild free fluid is noted surrounding the liver similar to that seen around the gallbladder. IMPRESSION: Mild ascites.  No other focal abnormality is noted. Electronically Signed   By: Inez Catalina M.D.   On: 01/08/2020 20:17        Scheduled Meds: . aspirin  81 mg Oral Daily  . atorvastatin  40 mg Oral q1800  . folic acid  1 mg Oral Daily  . multivitamin with minerals  1 tablet Oral Daily  . potassium chloride  40 mEq Oral BID  . thiamine  100 mg Oral Daily   Continuous Infusions: . dextrose 5% lactated ringers 50 mL/hr at 01/09/20 0930     LOS: 1 day    Time spent: 30 minutes    Barb Merino, MD [facility] Pager [phone]

## 2020-01-09 NOTE — Progress Notes (Signed)
Pt refusing EEG. Pt's daughter at bedside as well, pt still refused.

## 2020-01-09 NOTE — Evaluation (Signed)
Physical Therapy Evaluation Patient Details Name: Nancy Castro MRN: [id number] DOB: [birth year] Today's Date: 01/09/2020   History of Present Illness  69 year old female admitted with acute encephalopathy; PMH includes hypertension, dyslipidemia, dementia was diagnosed 5 years ago, hyperreactive; According to the chart, when daughter went to check on her prior to this admission, patient refused to open door, was combative, and was not talking- low BP 83/52.  Clinical Impression  Patient was in [facility] chair when PT arrived. She was focused on making a phone call, and presented with very flat affect. She states she lives alone, in a one level home with 3 outside steps /handrails. Walk in shower with safety bars. Standard height commode. No AD. She relates that she was independent in all ADLs including driving. A daughter arrived. She indicated that "she could stay with her 24/7 when she first goes home" but normally this daughter does not live there. Patient is very impulsive and at high risk for falls. She presents with reduced insight into her medical status and has reduced safety awareness. Could potentially benefit from OUTPATIENT PT depending what is determined medically .     Follow Up Recommendations Outpatient PT(since she indicates that she was driving, and depending on what is determined medically, could potentially benefit from OUTPATIENT)    Equipment Recommendations       Recommendations for Other Services       Precautions / Restrictions Precautions Precautions: Fall Precaution Comments: She is very impulsive and does not like to be corrected. She is at risk for falls. Restrictions Weight Bearing Restrictions: No      Mobility  Bed Mobility Overal bed mobility: Modified Independent                Transfers Overall transfer level: Needs assistance(impulsive, unsteady, at risk for falls- tended to get in a hurry)   Transfers: Sit to/from Stand Sit to Stand:  Supervision(Close supervision)            Ambulation/Gait Ambulation/Gait assistance: Supervision(Close supervision) Gait Distance (Feet): 24 Feet   Gait Pattern/deviations: Wide base of support     General Gait Details: tended to get in a hurry  Stairs            Wheelchair Mobility    Modified Rankin (Stroke Patients Only)       Balance Overall balance assessment: Mild deficits observed, not formally tested                                           Pertinent Vitals/Pain      Home Living Family/patient expects to be discharged to:: Private residence Living Arrangements: Alone   Type of Home: House Home Access: Stairs to enter Entrance Stairs-Rails: Right;Left;Can reach both Entrance Stairs-Number of Steps: 3 Home Layout: One level Home Equipment: Grab bars - tub/shower Additional Comments: According to patient , she was independent in all ADLs, including driving. No equipment.    Prior Function Level of Independence: Independent               Hand Dominance   Dominant Hand: Right    Extremity/Trunk Assessment        Lower Extremity Assessment Lower Extremity Assessment: Generalized weakness       Communication   Communication: No difficulties(Flat affect, and frequent delays in answering.)  Cognition Arousal/Alertness: Awake/alert Behavior During Therapy: WFL for tasks assessed/performed;Flat  affect Overall Cognitive Status: Within Functional Limits for tasks assessed                                 General Comments: She is impulsive, and at risk for falls. Has reduced insight into her current situation, stating "she is fine and wants to go home as there is nothing wrong"      General Comments General comments (skin integrity, edema, etc.): Monitor closely for postural awareness and safety awareness.    Exercises General Exercises - Lower Extremity Ankle Circles/Pumps: AROM;Seated Quad Sets:  AROM;Seated Gluteal Sets: AROM;Seated Short Arc Quad: AROM;Seated Heel Slides: AROM;Seated Hip ABduction/ADduction: AROM;Seated Shoulder Exercises Shoulder Flexion: AROM;Both Shoulder ABduction: AROM;Seated;Both Other Exercises Other Exercises: Encouraged her to perform all ex in between PT session, generally 2 x daily, but frequent ankle pumps and circles- she presented as disinterested.   Assessment/Plan    PT Assessment Patient needs continued PT services  PT Problem List Decreased strength;Decreased activity tolerance;Decreased mobility       PT Treatment Interventions Therapeutic activities;Stair training;Patient/family education;Therapeutic exercise    PT Goals (Current goals can be found in the Care Plan section)  Acute Rehab PT Goals Patient Stated Goal: Just want to go home and "thinks she is fine" PT Goal Formulation: With patient Time For Goal Achievement: 01/23/20 Potential to Achieve Goals: Good    Frequency Min 3X/week   Barriers to discharge        Co-evaluation               AM-PAC PT "6 Clicks" Mobility  Outcome Measure Help needed turning from your back to your side while in a flat bed without using bedrails?: None Help needed moving from lying on your back to sitting on the side of a flat bed without using bedrails?: None Help needed moving to and from a bed to a chair (including a wheelchair)?: A Little Help needed standing up from a chair using your arms (e.g., wheelchair or bedside chair)?: A Little Help needed to walk in hospital room?: A Little Help needed climbing 3-5 steps with a railing? : A Lot 6 Click Score: 19    End of Session   Activity Tolerance: Other (comment)(She did participate, but with little initiative. Impulsive, and at high risk for falls) Patient left: in chair;with call bell/phone within reach;with bed alarm set   PT Visit Diagnosis: Unsteadiness on feet (R26.81);Muscle weakness (generalized) (M62.81)    Time:  [id number] PT Time Calculation (min) (ACUTE ONLY): 43 min   Charges:   PT Evaluation $PT Eval Moderate Complexity: 1 Mod PT Treatments $Gait Training: 8-22 mins $Therapeutic Exercise: 8-22 mins       Rollen Sox, PT # [phone] CGV cell  Casandra Doffing 01/09/2020, 1:56 PM

## 2020-01-09 NOTE — Consult Note (Addendum)
  Consultation  Referring Provider: TRH/ Ghimire Primary Care Physician:  Tysinger, David S, PA-C Primary Gastroenterologist:  Dr.Pyrtle  Reason for Consultation: Altered mental status, elevated bilirubin  HPI: Nancy Castro is a 68 y.o. female ,, known to Dr. Pyrtle with prior history, of adenomatous colon polyps.  We are asked to see her today regarding their acute metabolic encephalopathy and isolated elevated bilirubin/indirect. Patient has history of hypertension, hyperlipidemia, osteoarthritis and previously documented mild cognitive impairment. She was admitted on 01/07/2020 after her daughter went to check on her as she had not been answering calls etc.  Per notes she was overtly confused and combative.  Daughter had been noticing milder changes in her behavior and cognition over the past few months. In the ER she was noted to be hypotensive. Mentation has improved, she is now calm and cooperative, oriented to person and place but not date and situation.  She has no active complaints. MRI of the brain done yesterday was negative. Psych has consulted but recommended medical clearance prior to further psych evaluation. Tylenol level on admit less than 10, urine drug screen negative, labs unrevealing except noting T bili of 8 with normal transaminases.  Direct bili 0.1.  Acute hepatitis panel negative COVID-19 negative, ammonia level 18 UA positive-culture pending  Abdominal ultrasound on admit showed a normal-appearing liver, mild perihepatic fluid noted.   Past Medical History:  Diagnosis Date  . DDD (degenerative disc disease), cervical   . H/O echocardiogram 2007  . History of exercise stress test 2007  . Hyperlipidemia   . Hypertension   . Mixed dyslipidemia   . Osteoarthritis   . Syncope 2012   cardiac stress test   . Wears glasses   . Wears partial dentures    upper and lower    Past Surgical History:  Procedure Laterality Date  . COLONOSCOPY     2014, polyps,  repeat 3 years  . COLONOSCOPY  07/14/2018   tubular adenoma polyps, repeat 3 years, Dr. Jay Pyrtle  . KNEE ARTHROSCOPY     right  . LIPOMA EXCISION     back x 2  . PARTIAL HYSTERECTOMY     fibroids; has both ovaries    Prior to Admission medications   Medication Sig Start Date End Date Taking? Authorizing Provider  aspirin 81 MG tablet Take 1 tablet (81 mg total) by mouth daily. 12/14/18  Yes Tysinger, David S, PA-C  atorvastatin (LIPITOR) 40 MG tablet TAKE 1 TABLET(40 MG) BY MOUTH DAILY AT 6 PM Patient taking differently: Take 40 mg by mouth daily.  12/20/19  Yes Tysinger, David S, PA-C  losartan (COZAAR) 50 MG tablet TAKE 1 TABLET(50 MG) BY MOUTH DAILY Patient taking differently: Take 50 mg by mouth daily.  12/20/19  Yes Tysinger, David S, PA-C  MULTIPLE VITAMIN PO Take 1 tablet by mouth daily.    Yes [provider]  niacin 250 MG tablet Take 1 tablet (250 mg total) by mouth at bedtime. 2 tablets OTC daily Patient taking differently: Take 500 mg by mouth at bedtime. 2 tablets OTC daily 12/14/18  Yes Tysinger, David S, PA-C  Vitamin D, Cholecalciferol, 10 MCG (400 UNIT) TABS Take 2 tablets by mouth daily. 12/14/18  Yes Tysinger, David S, PA-C    Current Facility-Administered Medications  Medication Dose Route Frequency Provider Last Rate Last Admin  . acetaminophen (TYLENOL) tablet 650 mg  650 mg Oral Q6H PRN Arrien, Mauricio Daniel, MD       Or  .   acetaminophen (TYLENOL) suppository 650 mg  650 mg Rectal Q6H PRN Arrien, Mauricio Daniel, MD      . aspirin chewable tablet 81 mg  81 mg Oral Daily Arrien, Mauricio Daniel, MD   81 mg at 01/09/20 0928  . atorvastatin (LIPITOR) tablet 40 mg  40 mg Oral q1800 Arrien, Mauricio Daniel, MD      . dextrose 5 % in lactated ringers infusion   Intravenous Continuous Ghimire, Kuber, MD 50 mL/hr at 01/09/20 0930 Rate Change at 01/09/20 0930  . folic acid (FOLVITE) tablet 1 mg  1 mg Oral Daily Arrien, Mauricio Daniel, MD   1 mg at 01/09/20 0928    . LORazepam (ATIVAN) injection 0.5 mg  0.5 mg Intravenous Q6H PRN Ghimire, Kuber, MD   0.5 mg at 01/09/20 0004  . multivitamin with minerals tablet 1 tablet  1 tablet Oral Daily Arrien, Mauricio Daniel, MD   1 tablet at 01/09/20 0927  . ondansetron (ZOFRAN) tablet 4 mg  4 mg Oral Q6H PRN Arrien, Mauricio Daniel, MD       Or  . ondansetron (ZOFRAN) injection 4 mg  4 mg Intravenous Q6H PRN Arrien, Mauricio Daniel, MD      . potassium chloride SA (KLOR-CON) CR tablet 40 mEq  40 mEq Oral BID Ghimire, Kuber, MD   40 mEq at 01/09/20 0927  . thiamine tablet 100 mg  100 mg Oral Daily Arrien, Mauricio Daniel, MD   100 mg at 01/09/20 0928    Allergies as of 01/07/2020  . (No Known Allergies)    Family History  Problem Relation Age of Onset  . Cancer Mother        died of breast cancer  . Cancer Father        father lung cancer  . Cancer Sister        lung, mets to brain  . Cancer Brother        lung and stomach  . Stomach cancer Brother        died at age 62  . Hypertension Maternal Grandmother   . Cancer Brother        lung  . Diabetes Brother   . Heart disease Neg Hx   . Stroke Neg Hx   . Colon cancer Neg Hx   . Rectal cancer Neg Hx     Social History   Socioeconomic History  . Marital status: Married    Spouse name: Not on file  . Number of children: Not on file  . Years of education: Not on file  . Highest education level: Not on file  Occupational History  . Not on file  Tobacco Use  . Smoking status: Former Smoker    Packs/day: 0.25    Years: 5.00    Pack years: 1.25    Quit date: 02/08/1987    Years since quitting: 32.9  . Smokeless tobacco: Former User    Types: Chew  . Tobacco comment: Stopped chewing years  ago.  Substance and Sexual Activity  . Alcohol use: No    Alcohol/week: 0.0 standard drinks  . Drug use: No  . Sexual activity: Not on file    Comment: 1986  Other Topics Concern  . Not on file  Social History Narrative   Divorced, has 2 children,  exercise - treadmill, most days of the week, retired, fork lift driver and stocker prior.  No grandchildren.   Sews - hobby.  12/2018   Social Determinants of Health   Financial   Resource Strain:   . Difficulty of Paying Living Expenses: Not on file  Food Insecurity:   . Worried About Running Out of Food in the Last Year: Not on file  . Ran Out of Food in the Last Year: Not on file  Transportation Needs:   . Lack of Transportation (Medical): Not on file  . Lack of Transportation (Non-Medical): Not on file  Physical Activity:   . Days of Exercise per Week: Not on file  . Minutes of Exercise per Session: Not on file  Stress:   . Feeling of Stress : Not on file  Social Connections:   . Frequency of Communication with Friends and Family: Not on file  . Frequency of Social Gatherings with Friends and Family: Not on file  . Attends Religious Services: Not on file  . Active Member of Clubs or Organizations: Not on file  . Attends Club or Organization Meetings: Not on file  . Marital Status: Not on file  Intimate Partner Violence:   . Fear of Current or Ex-Partner: Not on file  . Emotionally Abused: Not on file  . Physically Abused: Not on file  . Sexually Abused: Not on file    Review of Systems: Pertinent positive and negative review of systems were noted in the above HPI section.  All other review of systems was otherwise negative.  Physical Exam: Vital signs in last 24 hours: Temp:  [98 F (36.7 C)-98.2 F (36.8 C)] 98 F (36.7 C) (03/09 0412) Pulse Rate:  [85] 85 (03/09 0412) Resp:  [16-17] 17 (03/09 0412) BP: (133-145)/(78-93) 145/78 (03/09 0412) SpO2:  [96 %-100 %] 100 % (03/09 0412) Last BM Date: (pt & daughter can't recall) General:   Alert,  Well-developed, well-nourished, pleasant and cooperative in NAD.  Oriented to person and knows she is in the hospital Head:  Normocephalic and atraumatic. Eyes:  Sclera iicteric conjunctiva pink. Ears:  Normal auditory  acuity. Nose:  No deformity, discharge,  or lesions. Mouth:  No deformity or lesions.   Neck:  Supple; no masses or thyromegaly. Lungs:  Clear throughout to auscultation.   No wheezes, crackles, or rhonchi. Heart:  Regular rate and rhythm; no murmurs, clicks, rubs,  or gallops. Abdomen:  Soft,nontender, BS active,nonpalp mass or hsm.   Rectal:  Deferred  Msk:  Symmetrical without gross deformities. . Pulses:  Normal pulses noted. Extremities:  Without clubbing or edema. Neurologic:  Alert and  oriented x1 Skin:  Intact without significant lesions or rashes.. Psych:  Alert and cooperative.  Affect flat  Intake/Output from previous day: 03/08 0701 - 03/09 0700 In: -  Out: 2380 [Urine:2380] Intake/Output this shift: No intake/output data recorded.  Lab Results: Recent Labs    01/07/20 1105 01/08/20 0603 01/09/20 0648  WBC 6.9 6.3 5.4  HGB 12.4 11.2* 12.0  HCT 38.3 34.9* 37.1  PLT 287 269 279   BMET Recent Labs    01/07/20 1105 01/08/20 0603 01/09/20 0648  NA 141 140 135  K 3.6 3.2* 3.8  CL 104 107 105  CO2 23 22 21*  GLUCOSE 92 120* 104*  BUN 13 15 13  CREATININE 0.86 1.04* 0.93  CALCIUM 9.4 9.1 9.3   LFT Recent Labs    01/07/20 1105 01/08/20 1551 01/09/20 0648  PROT   < >  --  5.9*  ALBUMIN   < >  --  3.6  AST   < >  --  34  ALT   < >  --    26  ALKPHOS   < >  --  51  BILITOT   < >  --  8.5*  BILIDIR  --  0.1  --    < > = values in this interval not displayed.   PT/INR No results for input(s): LABPROT, INR in the last 72 hours. Hepatitis Panel Recent Labs    01/09/20 0648  HEPBSAG NON REACTIVE  HCVAB PENDING  HEPAIGM PENDING  HEPBIGM PENDING          IMPRESSION:  #62 69 year old African-American female admitted with altered mental status, felt secondary to acute metabolic encephalopathy. Rule out underlying dementia with acute encephalopathy secondary to UTI  Mental status improving  #2 elevated indirect bilirubin-no evidence for  underlying liver disease. Question hemolysis  #3 history of hypertension-hypotensive on admit, resolved #4 hyperlipidemia #5 history of adenomatous colon polyps  PLAN: It does not appear that patient needs further hepatic work-up at this time. Dr. Silverio Decamp  has reviewed, and work-up will be pursued for hemolysis   Amy Esterwood PA-C 01/09/2020, 12:18 PM    Attending physician's note   I have taken a history, examined the patient and reviewed the chart. I agree with the Advanced Practitioner's note, impression and recommendations.  69 year old female with altered mental status ?  Acute metabolic encephalopathy of unclear etiology No evidence of cirrhosis or portal hypertension Hyperbilirubinemia predominantly indirect, differential includes benign Gilbert's syndrome, hemolysis secondary to acute viral infection Check LDH, haptoglobin, ferritin, EBV, HSV, CMV viral panel Will also check ceruloplasmin, has low alk phos level.  Do not recommend any additional GI or liver work-up at this point.     Damaris Hippo , MD [phone]

## 2020-01-09 NOTE — Evaluation (Signed)
Occupational Therapy Evaluation Patient Details Name: Nancy Castro MRN: [id number] DOB: 1951/09/17 Today's Date: 01/09/2020    History of Present Illness 69 year old female admitted with acute encephalopathy; PMH includes hypertension, dyslipidemia, dementia was diagnosed 5 years ago, hyperreactive; According to the chart, when daughter went to check on her prior to this admission, patient refused to open door, was combative, and was not talking- low BP 83/52.   Clinical Impression   Pt admitted with the above. Pt currently with functional limitations due to the deficits listed below (see OT Problem List).  Pt will benefit from skilled OT to increase their safety and independence with ADL and functional mobility for ADL to facilitate discharge to venue listed below.      Follow Up Recommendations  Home health OT;Supervision/Assistance - 24 hour    Equipment Recommendations  None recommended by OT    Recommendations for Other Services       Precautions / Restrictions Precautions Precautions: Fall Precaution Comments: She is very impulsive and does not like to be corrected. She is at risk for falls. Restrictions Weight Bearing Restrictions: No      Mobility Bed Mobility Overal bed mobility: Modified Independent                Transfers Overall transfer level: Needs assistance(impulsive, unsteady, at risk for falls- tended to get in a hurry)   Transfers: Sit to/from [facility] Sit to Stand: Supervision(Close supervision) Stand pivot transfers: Min assist            Balance Overall balance assessment: Mild deficits observed, not formally tested                                         ADL either performed or assessed with clinical judgement   ADL Overall ADL's : Needs assistance/impaired Eating/Feeding: Set up;Sitting   Grooming: Set up;Sitting   Upper Body Bathing: Set up;Sitting   Lower Body Bathing: Minimal  assistance;Sit to/from stand;Cueing for compensatory techniques;Cueing for safety   Upper Body Dressing : Set up;Sitting   Lower Body Dressing: Minimal assistance;Sit to/from stand;Cueing for safety;Cueing for compensatory techniques;Cueing for sequencing   Toilet Transfer: Minimal assistance;Stand-pivot;Cueing for sequencing;Cueing for safety   Toileting- Clothing Manipulation and Hygiene: Minimal assistance;Sit to/from stand;Cueing for compensatory techniques;Cueing for safety;Cueing for sequencing       Functional mobility during ADLs: Minimal assistance;Cueing for safety;Cueing for sequencing       Vision Patient Visual Report: No change from baseline           Hand Dominance Right   Extremity/Trunk Assessment Upper Extremity Assessment Upper Extremity Assessment: Generalized weakness   Lower Extremity Assessment Lower Extremity Assessment: Generalized weakness       Communication Communication Communication: No difficulties(Flat affect, and frequent delays in answering.)   Cognition Arousal/Alertness: Awake/alert Behavior During Therapy: WFL for tasks assessed/performed;Flat affect Overall Cognitive Status: Within Functional Limits for tasks assessed                                 General Comments: She is impulsive, and at risk for falls. Has reduced insight into her current situation, stating "she is fine and wants to go home as there is nothing wrong"   General Comments  Monitor closely for postural awareness and safety awareness.  Home Living Family/patient expects to be discharged to:: Private residence Living Arrangements: Alone   Type of Home: House Home Access: Stairs to enter Technical brewer of Steps: 3 Entrance Stairs-Rails: Right;Left;Can reach both Home Layout: One level     Bathroom Shower/Tub: Occupational psychologist: Standard Bathroom Accessibility: Yes   Home Equipment: Grab bars -  tub/shower   Additional Comments: According to patient , she was independent in all ADLs, including driving. No equipment.      Prior Functioning/Environment Level of Independence: Independent                 OT Problem List: Decreased strength;Decreased activity tolerance;Impaired balance (sitting and/or standing)      OT Treatment/Interventions: Self-care/ADL training;Patient/family education;DME and/or AE instruction    OT Goals(Current goals can be found in the care plan section) Acute Rehab OT Goals Patient Stated Goal: Just want to go home and "thinks she is fine" ADL Goals Pt Will Perform Lower Body Dressing: with modified independence;sit to/from stand Pt Will Transfer to Toilet: with modified independence;regular height toilet Pt Will Perform Toileting - Clothing Manipulation and hygiene: with modified independence;sit to/from stand  OT Frequency: Min 2X/week   Barriers to D/C:                  End of Session Nurse Communication: Mobility status  Activity Tolerance: Patient tolerated treatment well Patient left: in chair;with call bell/phone within reach;with chair alarm set  OT Visit Diagnosis: Unsteadiness on feet (R26.81);Muscle weakness (generalized) (M62.81)                Time: [id number] OT Time Calculation (min): 20 min Charges:  OT General Charges $OT Visit: 1 Visit OT Evaluation $OT Eval Moderate Complexity: 1 Mod  Kari Baars, [place] [phone] Office- [phone], Edwena Felty D 01/09/2020, 3:53 PM

## 2020-01-09 NOTE — Consult Note (Addendum)
Neurology Consultation  Reason for Consult: 3 days of confusion which has now significantly improved  Referring Physician:Dr. Ghimire   CC: Transient confusion  History is obtained from: Daughter   HPI: Nancy Castro is a 69 y.o. female with past medical history of syncope, hypertension, hyperlipidemia, degenerative disc disease.  Patient was initially brought to the hospital secondary to confusion.  As patient was in the hospital she slowly improved. Neurology was consulted for further evaluation of patient.  In speaking with daughter, patient lives by herself, is very independent, takes care of her bills, she does drive but this is usually via a frequently traveled route that she is familiar with.  Apparently the day of admission patient was noted to be extremely altered and confused.  Patient was found to be hypoactive and combative when the daughter had entered the house at that time.  In the emergency department initial blood pressures were very low at 83/52.  CT of head was normal and MRI of brain showed no acute abnormalities.  It was noticed that with hydration patient's mentation improved.  Per daughter over the last 2 days patient is 90% if not more back to baseline.  Work up that has been done includes: 1.  Ammonia 18 2.  TSH 3.  Bilirubin 0.1   Past Medical History:  Diagnosis Date  . DDD (degenerative disc disease), cervical   . H/O echocardiogram 2007  . History of exercise stress test 2007  . Hyperlipidemia   . Hypertension   . Mixed dyslipidemia   . Osteoarthritis   . Syncope 2012   cardiac stress test   . Wears glasses   . Wears partial dentures    upper and lower    Family History  Problem Relation Age of Onset  . Cancer Mother        died of breast cancer  . Cancer Father        father lung cancer  . Cancer Sister        lung, mets to brain  . Cancer Brother        lung and stomach  . Stomach cancer Brother        died at age 72  . Hypertension  Maternal Grandmother   . Cancer Brother        lung  . Diabetes Brother   . Heart disease Neg Hx   . Stroke Neg Hx   . Colon cancer Neg Hx   . Rectal cancer Neg Hx    Social History:   reports that she quit smoking about 32 years ago. She has a 1.25 pack-year smoking history. She has quit using smokeless tobacco.  Her smokeless tobacco use included chew. She reports that she does not drink alcohol or use drugs.  Medications  Current Facility-Administered Medications:  .  acetaminophen (TYLENOL) tablet 650 mg, 650 mg, Oral, Q6H PRN **OR** acetaminophen (TYLENOL) suppository 650 mg, 650 mg, Rectal, Q6H PRN, Arrien, Jimmy Picket, MD .  aspirin chewable tablet 81 mg, 81 mg, Oral, Daily, Arrien, Jimmy Picket, MD, 81 mg at 01/09/20 [id number] .  atorvastatin (LIPITOR) tablet 40 mg, 40 mg, Oral, q1800, Arrien, Jimmy Picket, MD .  dextrose 5 % in lactated ringers infusion, , Intravenous, Continuous, Ghimire, Kuber, MD, Last Rate: 50 mL/hr at 01/09/20 0930, Rate Change at 01/09/20 0930 .  folic acid (FOLVITE) tablet 1 mg, 1 mg, Oral, Daily, Arrien, Jimmy Picket, MD, 1 mg at 01/09/20 [phone] .  LORazepam (ATIVAN) injection 0.5 mg,  0.5 mg, Intravenous, Q6H PRN, Barb Merino, MD, 0.5 mg at 01/09/20 0004 .  multivitamin with minerals tablet 1 tablet, 1 tablet, Oral, Daily, Arrien, Jimmy Picket, MD, 1 tablet at 01/09/20 [phone] .  ondansetron (ZOFRAN) tablet 4 mg, 4 mg, Oral, Q6H PRN **OR** ondansetron (ZOFRAN) injection 4 mg, 4 mg, Intravenous, Q6H PRN, Arrien, Jimmy Picket, MD .  potassium chloride SA (KLOR-CON) CR tablet 40 mEq, 40 mEq, Oral, BID, Barb Merino, MD, 40 mEq at 01/09/20 0927 .  thiamine tablet 100 mg, 100 mg, Oral, Daily, Arrien, Jimmy Picket, MD, 100 mg at 01/09/20 0928  ROS:  General ROS: negative for - chills, fatigue, fever, night sweats, weight gain or weight loss Psychological ROS: negative for - behavioral disorder, hallucinations, memory difficulties, mood swings  or suicidal ideation Ophthalmic ROS: negative for - blurry vision, double vision, eye pain or loss of vision ENT ROS: negative for - epistaxis, nasal discharge, oral lesions, sore throat, tinnitus or vertigointolerance Respiratory ROS: negative for - cough, hemoptysis, shortness of breath or wheezing Cardiovascular ROS: negative for - chest pain, dyspnea on exertion, edema or irregular heartbeat Gastrointestinal ROS: negative for - abdominal pain, diarrhea, hematemesis, nausea/vomiting or stool incontinence Genito-Urinary ROS: negative for - dysuria, hematuria, incontinence or urinary frequency/urgency Musculoskeletal ROS: negative for - joint swelling or muscular weakness Neurological ROS: as noted in HPI Dermatological ROS: negative for rash and skin lesion changes  Exam: Current vital signs: BP (!) 145/78 (BP Location: Left Arm)   Pulse 85   Temp 98 F (36.7 C) (Oral)   Resp 17   Ht 5\' 1"  (1.549 m)   Wt 60.9 kg   SpO2 100%   BMI 25.36 kg/m  Vital signs in last 24 hours: Temp:  [98 F (36.7 C)-98.2 F (36.8 C)] 98 F (36.7 C) (03/09 0412) Pulse Rate:  [85] 85 (03/09 0412) Resp:  [16-17] 17 (03/09 0412) BP: (133-145)/(78-93) 145/78 (03/09 0412) SpO2:  [96 %-100 %] 100 % (03/09 1339)   Constitutional: Appears well-developed and well-nourished.  Psych: Affect appropriate to situation Eyes: No scleral injection HENT: No OP obstrucion Head: Normocephalic.  Cardiovascular: Normal rate and regular rhythm.  Respiratory: Effort normal, non-labored breathing GI: Soft.  No distension. There is no tenderness.  Skin: WDI  Neuro: Mental Status: Patient is alert, oriented to hospital, city, state, month and year, but not day of the week. Knows the name of the president, but not the vice president. Able to name objects, able to follow two-step commands but has difficulty with three-step commands.  Able to tell me how many quarters are in $2.  Unable to name fingers.  Speech is fluent.  Repetition intact. Able to spell WORLD forwards but jumbles the letters going backwards. Able to recite months of the year forwards without hesitation, but has significant difficulty reciting backwards, being unable to go beyond Dec-Nov-Oct-Sept.  Cranial Nerves: II: Visual Fields are full.  III,IV, VI: EOMI without ptosis or diplopia. Pupils equal, round and reactive to light V: Facial sensation is symmetric to temperature VII: Facial movement is symmetric.  VIII: hearing is intact to voice X: Palate elevates symmetrically XI: Shoulder shrug is symmetric. XII: tongue is midline without atrophy or fasciculations.  Motor: Patient has 5/5 strength throughout Sensory: Sensation is symmetric to light touch and temperature in the arms and legs. Deep Tendon Reflexes: Patient has no ankle jerk, 1+ knee jerk and 2+ brachioradialis. Plantars: Toes are downgoing bilaterally.  Cerebellar: No ataxia noted.  Gait: Deferred  Labs I  have reviewed labs in epic and the results pertinent to this consultation are:   CBC    Component Value Date/Time   WBC 5.4 01/09/2020 0648   RBC 3.76 (L) 01/09/2020 0648   HGB 12.0 01/09/2020 0648   HGB 12.6 12/13/2018 1020   HCT 37.1 01/09/2020 0648   HCT 37.8 12/13/2018 1020   PLT 279 01/09/2020 0648   PLT 281 12/13/2018 1020   MCV 98.7 01/09/2020 0648   MCV 97 12/13/2018 1020   MCH 31.9 01/09/2020 0648   MCHC 32.3 01/09/2020 0648   RDW 12.9 01/09/2020 0648   RDW 11.6 (L) 12/13/2018 1020   LYMPHSABS 0.5 (L) 01/09/2020 0648   MONOABS 0.5 01/09/2020 0648   EOSABS 0.0 01/09/2020 0648   BASOSABS 0.0 01/09/2020 0648    CMP     Component Value Date/Time   NA 135 01/09/2020 0648   NA 141 12/13/2018 1020   K 3.8 01/09/2020 0648   CL 105 01/09/2020 0648   CO2 21 (L) 01/09/2020 0648   GLUCOSE 104 (H) 01/09/2020 0648   BUN 13 01/09/2020 0648   BUN 9 12/13/2018 1020   CREATININE 0.93 01/09/2020 0648   CREATININE 0.62 08/25/2017 1005   CALCIUM 9.3  01/09/2020 0648   PROT 5.9 (L) 01/09/2020 0648   PROT 6.7 12/13/2018 1020   ALBUMIN 3.6 01/09/2020 0648   ALBUMIN 4.8 12/13/2018 1020   AST 34 01/09/2020 0648   ALT 26 01/09/2020 0648   ALKPHOS 51 01/09/2020 0648   BILITOT 8.5 (H) 01/09/2020 0648   BILITOT 0.5 12/13/2018 1020   GFRNONAA >60 01/09/2020 0648   GFRAA >60 01/09/2020 0648    Lipid Panel     Component Value Date/Time   CHOL 176 12/13/2018 1020   CHOL 187 08/07/2015 0907   CHOL 274 (H) 01/30/2015 0001   TRIG 71 12/13/2018 1020   TRIG 51 08/07/2015 0907   TRIG 73 01/30/2015 0001   HDL 77 12/13/2018 1020   HDL 89 08/07/2015 0907   HDL 82 01/30/2015 0001   CHOLHDL 2.3 12/13/2018 1020   CHOLHDL 2.0 08/25/2017 1005   VLDL 16 12/17/2016 1212   LDLCALC 85 12/13/2018 1020   LDLCALC 84 08/25/2017 1005   LDLCALC 88 08/07/2015 0907   LDLCALC 177 (H) 01/30/2015 0001     Imaging I have reviewed the images obtained:  CT-scan of the brain-no acute intracranial findings  MRI examination of the brain-no acute intracranial abnormality.  Unremarkable MRI of brain for age.  Etta Quill PA-C [facility] [phone] 01/09/2020, 2:15 PM     Assessment:  69 year old female who at baseline is independent with her ADLs, drives and lives alone, presenting with acute confusion and hypotension. Her cognition has gradually improved almost back to baseline.  1. MRI brain was negative for acute abnormality and was unremarkable for her age.  2. Urinalysis was negative.   3. Given mild impairments in concentration and orientation noted on exam, the patient may have a mild underlying dementia that predisposed her to manifesting a delirium due to mild intercurrent infection, possible unwitnessed fall or toxic ingestion.  Recommendations: 1.  Continue with appropriate hydration and nutrition 2.  Repeat outpatient Neurological evaluation for possible incipient dementia.  3.  If patient has another episode of acute confusion,  would recommend having an EEG. 4. Thank you for allowing [place] to participate in the care of this patient. If there are additional questions, please call the Neurohospitalist service.   I have seen and examined the patient.  I have formulated the assessment and recommendations. 70 year old female with acute confusion in the setting of hypotension. Now significantly improved. Recommendations as above.  Electronically signed: Dr. Kerney Elbe

## 2020-01-10 LAB — COMPREHENSIVE METABOLIC PANEL
ALT: 24 U/L (ref 0–44)
AST: 29 U/L (ref 15–41)
Albumin: 3.7 g/dL (ref 3.5–5.0)
Alkaline Phosphatase: 58 U/L (ref 38–126)
Anion gap: 4 — ABNORMAL LOW (ref 5–15)
BUN: 12 mg/dL (ref 8–23)
CO2: 28 mmol/L (ref 22–32)
Calcium: 9.6 mg/dL (ref 8.9–10.3)
Chloride: 106 mmol/L (ref 98–111)
Creatinine, Ser: 0.67 mg/dL (ref 0.44–1.00)
GFR calc Af Amer: >60 mL/min (ref >60)
GFR calc non Af Amer: >60 mL/min (ref >60)
Glucose, Bld: 112 mg/dL — ABNORMAL HIGH (ref 70–99)
Potassium: 4.9 mmol/L (ref 3.5–5.1)
Sodium: 138 mmol/L (ref 135–145)
Total Bilirubin: 2.3 mg/dL — ABNORMAL HIGH (ref 0.3–1.2)
Total Protein: 6.5 g/dL (ref 6.5–8.1)

## 2020-01-10 LAB — FERRITIN: Ferritin: 202 ng/mL (ref 11–307)

## 2020-01-10 LAB — LACTATE DEHYDROGENASE: LDH: 179 U/L (ref 98–192)

## 2020-01-10 NOTE — Progress Notes (Signed)
Discussed with patient and daughter discharge instructions, they verbalized agreement and understanding.  Patient to leave in private vehicle with all belongings.

## 2020-01-10 NOTE — Discharge Summary (Signed)
Physician Discharge Summary  Nancy Castro [id number] DOB: February 22, 1951 DOA: 01/07/2020  PCP: Carlena Hurl, PA-C  Admit date: 01/07/2020 Discharge date: 01/10/2020  Admitted From: Home Disposition: Home  Recommendations for Outpatient Follow-up:  1. Follow up with PCP in 1-2 weeks 2. Please obtain BMP/CBC in one week 3. Please follow up on the following pending results: 4. CMV, Epstein-Barr virus, results will be called  Home Health: Not applicable Equipment/Devices: Not applicable  Discharge Condition: Stable CODE STATUS: Full code Diet recommendation: Low-salt diet  Discharge summary: Patient with history of hypertension and dyslipidemia.  Does have history of occasional memory issues in the past, however she is very functional, lives independent and drives.  She was noted to have some obsessive thoughts for last few months.  Patient's daughter went to see her, she would not open the door, daughter forced open the door and went inside only to find that patient was very somnolent, hypoactive, agitated and confused.  Was brought to the EMS.  In the emergency room, patient was combative nonverbal.  Initially blood pressure was 83/52.  CT head was normal.  No focal deficits.  Treated with IV fluids, symptomatic management and admitted to the hospital.  Acute encephalopathy: Exact cause unknown.  No focal neurological deficits.  Found with low blood pressure, however no other evidence of infection. Urinalysis and cultures normal.  Chest x-ray normal.  CT head and MRI of the brain normal with old meningioma.  Drug screen, ammonia, urea, TSH, alcohol level, salicylates and Tylenol were normal.  Did not have any evidence of seizure in the hospital. Patient remained hypoactive and with delayed speaking, however with symptomatic treatment everything improved and she is almost back to normal self now. Seen by neurology, currently no active intervention suggested, will send for outpatient  neurology referral. Her LFT showed indirect bilirubin of 8 with no evidence of hemolysis that improved and now come down to 2.  Direct bilirubin was normal.  Upper quadrant ultrasound was normal.  Her abnormal enteric bilirubin was thought to be Gilbert's syndrome?,  Transient viral illnesses.  With all symptoms improved, she will be going home.  Has multiple viral panel sent, will follow up results.  Blood pressures have improved appropriately.  She will resume all her home medications. Suggested not to drive and take seizure precautions, family to supervise and help until neurology follow-up.  Discharge Diagnoses:  Principal Problem:   Encephalopathy acute Active Problems:   Mixed dyslipidemia   Essential hypertension   Altered mental status    Discharge Instructions  Discharge Instructions    AMB referral to rehabilitation   Complete by: As directed    Ambulatory referral to Neurology   Complete by: As directed    An appointment is requested in approximately: 4 weeks   Diet - low sodium heart healthy   Complete by: As directed    Discharge instructions   Complete by: As directed    Hydrate well. Do not drive or operate heavy machinery until seen at neurology follow up .   Increase activity slowly   Complete by: As directed      Allergies as of 01/10/2020   No Known Allergies     Medication List    TAKE these medications   aspirin 81 MG tablet Take 1 tablet (81 mg total) by mouth daily.   atorvastatin 40 MG tablet Commonly known as: LIPITOR TAKE 1 TABLET(40 MG) BY MOUTH DAILY AT 6 PM What changed: See the new instructions.  losartan 50 MG tablet Commonly known as: COZAAR TAKE 1 TABLET(50 MG) BY MOUTH DAILY What changed: See the new instructions.   MULTIPLE VITAMIN PO Take 1 tablet by mouth daily.   niacin 250 MG tablet Take 1 tablet (250 mg total) by mouth at bedtime. 2 tablets OTC daily What changed: how much to take   Vitamin D (Cholecalciferol) 10 MCG  (400 UNIT) Tabs Take 2 tablets by mouth daily.      Follow-up Information    [facility]   Specialties: Physical Therapy, Occupational Therapy Contact information: [place] [address] [phone]          No Known Allergies  Consultations:  GI  Neurology   Procedures/Studies: CT Head Wo Contrast  Result Date: 01/07/2020 CLINICAL DATA:  Altered mental status. EXAM: CT HEAD WITHOUT CONTRAST TECHNIQUE: Contiguous axial images were obtained from the base of the skull through the vertex without intravenous contrast. COMPARISON:  03/24/2011 from [facility]: Brain: No evidence of acute infarction, hemorrhage, hydrocephalus, extra-axial collection, or intra-axial mass lesion/mass effect. Vascular:  No hyperdense vessel or other acute findings. Skull: No evidence of fracture. A small extra-axial well ossified mass is seen in the right frontal region measuring 7 mm in thickness, without significant change since previous study, consistent with benign meningioma or osteoma. Sinuses/Orbits:  No acute findings. Other: None. IMPRESSION: No acute intracranial findings. Stable sub-cm ossified extra-axial mass in the right frontal vertex, consistent with meningioma or osteoma. Electronically Signed   By: Marlaine Hind M.D.   On: 01/07/2020 12:33   MR BRAIN WO CONTRAST  Result Date: 01/08/2020 CLINICAL DATA:  Altered mental status. Dementia EXAM: MRI HEAD WITHOUT CONTRAST TECHNIQUE: Multiplanar, multiecho pulse sequences of the brain and surrounding structures were obtained without intravenous contrast. COMPARISON:  MRI of the brain December 18, 2014 FINDINGS: Brain: No acute infarction, hemorrhage, hydrocephalus, extra-axial collection or mass lesion. No significant volume loss for white matter disease. Vascular: Normal flow voids. Skull and upper cervical spine: Normal marrow signal. Sinuses/Orbits: Negative. Other: No  significant interval change of the 8 mm extra-axial ossified lesion in the right frontal region, likely representing a meningioma. IMPRESSION: No acute intracranial abnormality. Unremarkable MRI of the brain for age. Electronically Signed   By: Pedro Earls M.D.   On: 01/08/2020 12:00   US Abdomen Limited  Result Date: 01/08/2020 CLINICAL DATA:  Abnormal LFTs EXAM: ULTRASOUND ABDOMEN LIMITED RIGHT UPPER QUADRANT COMPARISON:  None. FINDINGS: Gallbladder: No gallstones or wall thickening visualized. No sonographic Murphy sign noted by sonographer. Mild pericholecystic fluid is noted. This may be related underlying mild ascites. Common bile duct: Diameter: 3.4 mm. Liver: No focal lesion identified. Within normal limits in parenchymal echogenicity. Portal vein is patent on color Doppler imaging with normal direction of blood flow towards the liver. Other: Mild free fluid is noted surrounding the liver similar to that seen around the gallbladder. IMPRESSION: Mild ascites.  No other focal abnormality is noted. Electronically Signed   By: Inez Catalina M.D.   On: 01/08/2020 20:17   DG Chest Port 1 View  Result Date: 01/07/2020 CLINICAL DATA:  Altered mental status. EXAM: PORTABLE CHEST 1 VIEW COMPARISON:  04/11/2006 FINDINGS: Borderline enlarged cardiac silhouette. Tortuous aorta. Clear lungs with normal vascularity. Unremarkable bones. IMPRESSION: No acute abnormality. Electronically Signed   By: Claudie Revering M.D.   On: 01/07/2020 11:42     Subjective: Patient seen and examined.  No overnight events.  Now she is alert oriented x3.  She is eager to go home.  Denies any nausea vomiting.  Denies any chest pain or palpitations.  Slept well last night. Daughter arrived at the bedside and we discussed about plan of care.   Discharge Exam: Vitals:   01/09/20 2017 01/10/20 0537  BP: 125/85 137/74  Pulse: 85 79  Resp: 20 16  Temp: 98.1 F (36.7 C) 97.7 F (36.5 C)  SpO2: 99% 100%   Vitals:    01/09/20 0412 01/09/20 1339 01/09/20 2017 01/10/20 0537  BP: (!) 145/78  125/85 137/74  Pulse: 85  85 79  Resp: 17  20 16   Temp: 98 F (36.7 C)  98.1 F (36.7 C) 97.7 F (36.5 C)  TempSrc: Oral   Oral  SpO2: 100% 100% 99% 100%  Weight:      Height:        General: Pt is alert, awake, not in acute distress Cardiovascular: RRR, S1/S2 +, no rubs, no gallops Respiratory: CTA bilaterally, no wheezing, no rhonchi Abdominal: Soft, NT, ND, bowel sounds + Extremities: no edema, no cyanosis    The results of significant diagnostics from this hospitalization (including imaging, microbiology, ancillary and laboratory) are listed below for reference.     Microbiology: Recent Results (from the past 240 hour(s))  SARS CORONAVIRUS 2 (TAT 6-24 HRS) Nasopharyngeal Nasopharyngeal Swab     Status: None   Collection Time: 01/07/20  1:38 PM   Specimen: Nasopharyngeal Swab  Result Value Ref Range Status   SARS Coronavirus 2 NEGATIVE NEGATIVE Final    Comment: (NOTE) SARS-CoV-2 target nucleic acids are NOT DETECTED. The SARS-CoV-2 RNA is generally detectable in upper and lower respiratory specimens during the acute phase of infection. Negative results do not preclude SARS-CoV-2 infection, do not rule out co-infections with other pathogens, and should not be used as the sole basis for treatment or other patient management decisions. Negative results must be combined with clinical observations, patient history, and epidemiological information. The expected result is Negative. Fact Sheet for Patients: SugarRoll.be Fact Sheet for Healthcare Providers: https://www.woods-mathews.com/ This test is not yet approved or cleared by the [place] FDA and  has been authorized for detection and/or diagnosis of SARS-CoV-2 by FDA under an Emergency Use Authorization (EUA). This EUA will remain  in effect (meaning this test can be used) for the duration of  the COVID-19 declaration under Section 56 4(b)(1) of the Act, 21 U.S.C. section 360bbb-3(b)(1), unless the authorization is terminated or revoked sooner. Performed at [facility], [place] [address], [place], [place] [address]   Culture, Urine     Status: None   Collection Time: 01/08/20  4:45 PM   Specimen: Urine, Catheterized  Result Value Ref Range Status   Specimen Description   Final    URINE, CATHETERIZED Performed at [facility] [address], [place], [place] [address]    Special Requests   Final    NONE Performed at [facility], [place] [address], [place], [place] [address]    Culture   Final    NO GROWTH Performed at [facility], [place] [address] [place],  [address]    Report Status 01/09/2020 FINAL  Final     Labs: BNP (last 3 results) No results for input(s): BNP in the last 8760 hours. Basic Metabolic Panel: Recent Labs  Lab 01/07/20 1105 01/08/20 0603 01/09/20 0648 01/10/20 0622  NA 141 140 135 138  K 3.6 3.2* 3.8 4.9  CL 104 107 105 106  CO2 23 22 21* 28  GLUCOSE 92 120* 104* 112*  BUN 13 15 13 12   CREATININE 0.86 1.04* 0.93 0.67  CALCIUM 9.4 9.1 9.3 9.6  MG  --   --  1.9  --   PHOS  --   --  2.6  --    Liver Function Tests: Recent Labs  Lab 01/07/20 1105 01/09/20 0648 01/10/20 0622  AST 17 34 29  ALT 16 26 24   ALKPHOS 52 51 58  BILITOT 8.6* 8.5* 2.3*  PROT 7.0 5.9* 6.5  ALBUMIN 4.2 3.6 3.7   No results for input(s): LIPASE, AMYLASE in the last 168 hours. Recent Labs  Lab 01/07/20 1105  AMMONIA 18   CBC: Recent Labs  Lab 01/07/20 1105 01/08/20 0603 01/09/20 0648  WBC 6.9 6.3 5.4  NEUTROABS 5.1  --  4.4  HGB 12.4 11.2* 12.0  HCT 38.3 34.9* 37.1  MCV 99.7 98.6 98.7  PLT 287 269 279   Cardiac Enzymes: No results for input(s): CKTOTAL, CKMB, CKMBINDEX, TROPONINI in the last 168 hours. BNP: Invalid input(s): POCBNP CBG: Recent Labs  Lab 01/07/20 2126  GLUCAP 74    D-Dimer No results for input(s): DDIMER in the last 72 hours. Hgb A1c No results for input(s): HGBA1C in the last 72 hours. Lipid Profile No results for input(s): CHOL, HDL, LDLCALC, TRIG, CHOLHDL, LDLDIRECT in the last 72 hours. Thyroid function studies Recent Labs    01/07/20 1336  TSH 2.180   Anemia work up Recent Labs    01/10/20 0622  FERRITIN 202   Urinalysis    Component Value Date/Time   COLORURINE YELLOW 01/08/2020 0240   APPEARANCEUR CLOUDY (A) 01/08/2020 0240   LABSPEC 1.017 01/08/2020 0240   LABSPEC 1.010 12/13/2018 0941   PHURINE 5.0 01/08/2020 0240   GLUCOSEU NEGATIVE 01/08/2020 0240   HGBUR LARGE (A) 01/08/2020 0240   BILIRUBINUR NEGATIVE 01/08/2020 0240   BILIRUBINUR negative 12/13/2018 0941   BILIRUBINUR n 02/05/2016 0833   KETONESUR 20 (A) 01/08/2020 0240   PROTEINUR 30 (A) 01/08/2020 0240   UROBILINOGEN negative 02/05/2016 0833   NITRITE NEGATIVE 01/08/2020 0240   LEUKOCYTESUR NEGATIVE 01/08/2020 0240   Sepsis Labs Invalid input(s): PROCALCITONIN,  WBC,  LACTICIDVEN Microbiology Recent Results (from the past 240 hour(s))  SARS CORONAVIRUS 2 (TAT 6-24 HRS) Nasopharyngeal Nasopharyngeal Swab     Status: None   Collection Time: 01/07/20  1:38 PM   Specimen: Nasopharyngeal Swab  Result Value Ref Range Status   SARS Coronavirus 2 NEGATIVE NEGATIVE Final    Comment: (NOTE) SARS-CoV-2 target nucleic acids are NOT DETECTED. The SARS-CoV-2 RNA is generally detectable in upper and lower respiratory specimens during the acute phase of infection. Negative results do not preclude SARS-CoV-2 infection, do not rule out co-infections with other pathogens, and should not be used as the sole basis for treatment or other patient management decisions. Negative results must be combined with clinical observations, patient history, and epidemiological information. The expected result is Negative. Fact Sheet for  Patients: SugarRoll.be Fact Sheet for Healthcare Providers: https://www.woods-mathews.com/ This test is not yet approved or cleared by the [place] FDA and  has been authorized for detection and/or diagnosis of SARS-CoV-2 by FDA under an Emergency Use Authorization (EUA). This EUA will remain  in effect (meaning this test can be used) for the duration of the COVID-19 declaration under Section 56 4(b)(1) of the Act, 21 U.S.C. section 360bbb-3(b)(1), unless the  authorization is terminated or revoked sooner. Performed at [facility], [place] [address], [place], [place] [address]   Culture, Urine     Status: None   Collection Time: 01/08/20  4:45 PM   Specimen: Urine, Catheterized  Result Value Ref Range Status   Specimen Description   Final    URINE, CATHETERIZED Performed at [facility] [address] [place], [place] [address]    Special Requests   Final    NONE Performed at [facility], [place] [address], [place], [place] [address]    Culture   Final    NO GROWTH Performed at [facility], [place] [address] [place], [place] [address]    Report Status 01/09/2020 FINAL  Final     Time coordinating discharge:  40 minutes  SIGNED:   Barb Merino, MD  [facility] 01/10/2020, 11:11 AM

## 2020-01-11 LAB — IGG, IGA, IGM
IgA: 132 mg/dL (ref 87–352)
IgG (Immunoglobin G), Serum: 761 mg/dL (ref 586–1602)
IgM (Immunoglobulin M), Srm: 16 mg/dL — ABNORMAL LOW (ref 26–217)

## 2020-01-11 LAB — EPSTEIN-BARR VIRUS VCA, IGG: EBV VCA IgG: 136 U/mL — ABNORMAL HIGH (ref 0.0–17.9)

## 2020-01-11 LAB — CMV IGM: CMV IgM: <30 AU/mL (ref 0.0–29.9)

## 2020-01-11 LAB — CERULOPLASMIN: Ceruloplasmin: 25.6 mg/dL (ref 19.0–39.0)

## 2020-01-11 LAB — HAPTOGLOBIN: Haptoglobin: 107 mg/dL (ref 37–355)

## 2020-01-11 LAB — EPSTEIN-BARR VIRUS VCA, IGM: EBV VCA IgM: <36 U/mL (ref 0.0–35.9)

## 2020-01-12 LAB — RSV(RESPIRATORY SYNCYTIAL VIRUS) AB, BLOOD: RSV Ab: NEGATIVE

## 2020-01-17 ENCOUNTER — Other Ambulatory Visit: Payer: Self-pay

## 2020-01-17 ENCOUNTER — Ambulatory Visit (INDEPENDENT_AMBULATORY_CARE_PROVIDER_SITE_OTHER): Payer: Medicare Other | Admitting: Medical

## 2020-01-17 VITALS — BP 130/80 | HR 74 | Temp 97.7°F | Ht 61.0 in | Wt 129.0 lb

## 2020-01-17 DIAGNOSIS — G3184 Mild cognitive impairment, so stated: Secondary | ICD-10-CM

## 2020-01-17 DIAGNOSIS — R4182 Altered mental status, unspecified: Secondary | ICD-10-CM | POA: Diagnosis not present

## 2020-01-17 DIAGNOSIS — R4586 Emotional lability: Secondary | ICD-10-CM | POA: Insufficient documentation

## 2020-01-17 DIAGNOSIS — B349 Viral infection, unspecified: Secondary | ICD-10-CM | POA: Diagnosis not present

## 2020-01-17 DIAGNOSIS — I10 Essential (primary) hypertension: Secondary | ICD-10-CM

## 2020-01-17 NOTE — Patient Instructions (Signed)
Counseling [facility] [address] [place], [place] [address] [phone]    [facility] [phone]  [email or web address] [address] [place], [place], [place] [address]  Rema Fendt, therapist  Or Toy Cookey, MA, clinical psychologist    Verneda Skill. Clarene Reamer, therapist [phone] [address] [place], [place] [address]   [facility] [phone] [address], [place], [place] [address]   Vic Ripper, therapist [phone] [address], [place], [place] [address]   [facility] [address], [place], [place] [address]    Counseling and [facility] [phone] [place], [place], [place] [address]  [facility] [place], therapist Dr. Lynder Parents, psychiatrist Dr. Milana Huntsman, child psychiatrist   [facility] and staff [phone] [address], [place], [place],  [address]

## 2020-01-17 NOTE — Progress Notes (Signed)
Subjective Chief Complaint  Patient presents with  . Hospitalization Follow-up   Here today for hospital follow-up.  She is here with her daughter Nancy Castro who is in town temporarily but lives in [place].  Her son is in town as well helping out but he is not here today.  He lives in [place].  He needs an FMLA filled out for the time he was taken off to be here helping assist his mother.  She was admitted from 01/07/2020 through 01/10/2020 for altered mental status, encephalopathy, active problems of dyslipidemia, hypertension.  She had several tests done while in the hospital.  Here to follow-up on those test and neck steps.  She has an appointment scheduled for neurology follow-up next month in April.  She was also advised to consider physical therapy due to coordination issues. she notes that her coordination issues have improved since doing therapy before today.  Her daughter has been checking on her and when she did not respond they pursued going into her house and making sure she was okay.  She was ultimately seen in emergency department and hospitalized for altered mental status.  She had some issues with memory and cognition back in 2016 please saw neurology again diagnosed with mild cognitive dysfunction.  She is a widow as her husband has passed on.  Her daughter Nancy Castro feels like she has had a change in mood, concerns for anxiety, depression.  when specifically asking [name], she lives alone.  She does her on cooking, clothing, ADLs, driving.  She sometimes feels overwhelmed with things in the done around the house.  Apparently there has been a recent plumbing issue and it has been problems with some drainage in the backyard but it has been too wet to repair.  This frustrates her.    Her daughter says she is always been more of a homebody, loner, and she sometimes might not want to be a burden on people so she does not necessarily ask for help when she needs help.  She has felt lonely  this past year and her mood has been down.  She denies SI/HI.  She is divorced.  [name] is her home town.  Her daughter and her have had conversations about whether she would want to move in with form of them.  So far she seems to prefer staying here in her own house.  She has physical therapy while in the hospital but does not feel she needs this at this point.  Her daughter agrees.   Past Medical History:  Diagnosis Date  . DDD (degenerative disc disease), cervical   . H/O echocardiogram 2007  . History of exercise stress test 2007  . Hyperlipidemia   . Hypertension   . Mixed dyslipidemia   . Osteoarthritis   . Syncope 2012   cardiac stress test   . Wears glasses   . Wears partial dentures    upper and lower   Current Outpatient Medications on File Prior to Visit  Medication Sig Dispense Refill  . aspirin 81 MG tablet Take 1 tablet (81 mg total) by mouth daily. 90 tablet 3  . atorvastatin (LIPITOR) 40 MG tablet TAKE 1 TABLET(40 MG) BY MOUTH DAILY AT 6 PM (Patient taking differently: Take 40 mg by mouth daily. ) 90 tablet 0  . losartan (COZAAR) 50 MG tablet TAKE 1 TABLET(50 MG) BY MOUTH DAILY (Patient taking differently: Take 50 mg by mouth daily. ) 90 tablet 0  . MULTIPLE VITAMIN PO Take 1  tablet by mouth daily.     . niacin 250 MG tablet Take 1 tablet (250 mg total) by mouth at bedtime. 2 tablets OTC daily (Patient taking differently: Take 500 mg by mouth at bedtime. 2 tablets OTC daily) 180 tablet 3  . Vitamin D, Cholecalciferol, 10 MCG (400 UNIT) TABS Take 2 tablets by mouth daily. 180 tablet 3   No current facility-administered medications on file prior to visit.   ROS as in subjective   Objective: BP 130/80   Pulse 74   Temp 97.7 F (36.5 C)   Ht 5\' 1"  (1.549 m)   Wt 129 lb (58.5 kg)   SpO2 100%   BMI 24.37 kg/m   Gen: wd, wn, nad Answers questions appropriately, does not seem to be agitated or anxious.    Assessment: Encounter Diagnoses  Name Primary?   . Essential hypertension Yes  . Mild cognitive impairment   . Altered mental status, unspecified altered mental status type   . Viral syndrome   . Mood change      Plan: I reviewed the hospitalization records, discharge summary, head scans, labs, no chest pain.  Reviewed medications which are reconciled.  She has follow-up scheduled with neurology in April next month.  She declines home physical therapy or physical therapy at this time.  Additional labs today per hospital recommendation  I will complete an FMLA for her son Mliss Fritz who came into town to help her out.  He needs dates coverage of  3/11/-3/24  Her daughter Randalyn Rhea to go back to [place] in 2 days.  She plans to be back here in [place] to go with Ms. Sherryl Barters on her neurology appointment  Overall we discussed that the encephalopathy was blamed on transient viral syndrome and no other good cause was found.  We discussed that being alone at home presents some challenges.  She may be having problems dealing with having to maintain the house in terms of repairs and upkeep. We discussed memory, safety, ADLs, ways for [name] to stay connected to children out of state with social media and Administrator.      I recommended Nancy Castro help [name] go ahead and schedule appt with counseling/therapist ASAP.    Sahaana was seen today for hospitalization follow-up.  Diagnoses and all orders for this visit:  Essential hypertension -     Basic metabolic panel -     CBC  Mild cognitive impairment -     Basic metabolic panel -     CBC  Altered mental status, unspecified altered mental status type -     Basic metabolic panel -     CBC  Viral syndrome  Mood change   F/u pending labs

## 2020-01-18 ENCOUNTER — Telehealth: Payer: Self-pay | Admitting: Medical

## 2020-01-18 LAB — BASIC METABOLIC PANEL
BUN/Creatinine Ratio: 19 (ref 12–28)
BUN: 11 mg/dL (ref 8–27)
CO2: 24 mmol/L (ref 20–29)
Calcium: 9.9 mg/dL (ref 8.7–10.3)
Chloride: 106 mmol/L (ref 96–106)
Creatinine, Ser: 0.57 mg/dL (ref 0.57–1.00)
GFR calc Af Amer: 109 mL/min/{1.73_m2} (ref >59)
GFR calc non Af Amer: 95 mL/min/{1.73_m2} (ref >59)
Glucose: 85 mg/dL (ref 65–99)
Potassium: 4.5 mmol/L (ref 3.5–5.2)
Sodium: 142 mmol/L (ref 134–144)

## 2020-01-18 LAB — CBC
Hematocrit: 31.6 % — ABNORMAL LOW (ref 34.0–46.6)
Hemoglobin: 10.6 g/dL — ABNORMAL LOW (ref 11.1–15.9)
MCH: 32.9 pg (ref 26.6–33.0)
MCHC: 33.5 g/dL (ref 31.5–35.7)
MCV: 98 fL — ABNORMAL HIGH (ref 79–97)
Platelets: 446 10*3/uL (ref 150–450)
RBC: 3.22 x10E6/uL — ABNORMAL LOW (ref 3.77–5.28)
RDW: 11.9 % (ref 11.7–15.4)
WBC: 6.8 10*3/uL (ref 3.4–10.8)

## 2020-01-18 NOTE — Telephone Encounter (Signed)
Copy/SCAN, then fax back FMLA for son Kaddie Liotta

## 2020-01-18 NOTE — Telephone Encounter (Signed)
Pt. Son aware that paper work has been faxed and copy was made.

## 2020-01-26 ENCOUNTER — Telehealth: Payer: Self-pay | Admitting: Medical

## 2020-01-26 NOTE — Telephone Encounter (Signed)
Per her recent visit, I would like to refer to [facility] home health eval/social work consult.   See recent visits notes, memory concerns.  Please let her and daughter know about the referral and see how things are going since her recent visit.

## 2020-01-30 ENCOUNTER — Other Ambulatory Visit: Payer: Self-pay

## 2020-01-30 NOTE — Telephone Encounter (Signed)
Lmom for a call back for a message concerning patient.

## 2020-01-30 NOTE — Telephone Encounter (Signed)
Do you have the contact information to [facility] where patient was referred? Is so please let me know so I can contact patient daughter and let her know.

## 2020-01-31 ENCOUNTER — Other Ambulatory Visit: Payer: Self-pay | Admitting: *Deleted

## 2020-01-31 NOTE — Telephone Encounter (Signed)
I guess the referral you did, went to this location. You can try this contact [facility] Address: [address] [place], [place] [address] Phone: [phone]

## 2020-01-31 NOTE — Telephone Encounter (Signed)
I guess the referral you did, went to this location. You can try this contact [facility] Address: [address] [place], [place] [address] Phone: [phone]

## 2020-01-31 NOTE — Patient Outreach (Addendum)
[facility]) Care Management  01/31/2020  Nancy Castro 28-Jul-1951 [id number]   Referral Date: 3/30 Referral Source: MD office Referral Reason: memory concerns/home health eval/social work consult  Insurance: [place] attempt #1, successful to daughter Rudean Curt identity verified.  This care manager introduced self and stated purpose of call.  [facility] care management services explained.  [name] state she is visiting from [place] and will be here for several months, attempting now to discuss member's care with her.  Report she is unsure what the needs are at this time as she is still trying to figure that out.  She request not to engage in services at this time but prefer to have more discussions with member and read over [facility] information prior to making a decision.    Plan: RN CM will send successful outreach letter and follow up within the next 2 weeks for assessment of needs.

## 2020-01-31 NOTE — Telephone Encounter (Signed)
Information has been given to patient daughter per her request.

## 2020-02-06 ENCOUNTER — Ambulatory Visit: Payer: Medicare Other | Admitting: Diagnostic Neuroimaging

## 2020-02-09 ENCOUNTER — Other Ambulatory Visit: Payer: Self-pay | Admitting: *Deleted

## 2020-02-09 NOTE — Patient Outreach (Addendum)
[place] [facility]) Care Management  02/09/2020  VELETTA HARGREAVES 1951-03-11 [id number]   Referral Date: 3/30 Referral Source: MD office Referral Reason: memory concerns/home health eval/social work consult  Insurance: [place] attempt #2, unsuccessful.  HIPAA compliant voice message left for daughter [name], will follow up within the next 3-4 business days.    Update:  Call received back from daughter, member's identity verified.  Update on [facility] care management services explained.    Social: Daughter report member lives alone but she is in town helping her with management of her care for the next several months.  She state that member has been able to care for herself during the time that she has been with her, including cooking and cleaning.  State there was/is concern with member's ability to manage her stress level and becoming depresses/anxious about things, particularly repairs that need to be done on the home.  Member does not have finances to get work done.  She is open to having social worker contact her regarding options.  Conditions: Per chart, she has history of HTN, dyslipidemia, and osteoarthritis.    Medications: Reviewed with daughter, denies concerns for affordability.  State she helps member with management but report member is able to manage independently.  Appointments: Has appointment with PCP on 4/21 and with neurology on 5/4.  Daughter will provide transportation as she will remain with member throughout the summer.  Daughter report she feel member will be able to continue to be independent once she has gone back to [place], would like to have someone or company to be of support to member.  There are no urgent/complex concerns at this time, agrees to have follow up calls for chronic disease management and social worker involvement.  Plan: RN CM will place referrals to social worker and health coach.   Valente David, [place], MSN [place] [phone]

## 2020-02-09 NOTE — Addendum Note (Signed)
Addended byValente David on: 02/09/2020 05:51 PM   Modules accepted: Orders

## 2020-02-12 ENCOUNTER — Other Ambulatory Visit: Payer: Self-pay | Admitting: *Deleted

## 2020-02-16 ENCOUNTER — Encounter: Payer: Self-pay | Admitting: *Deleted

## 2020-02-16 ENCOUNTER — Other Ambulatory Visit: Payer: Self-pay | Admitting: *Deleted

## 2020-02-16 NOTE — Patient Outreach (Signed)
[facility]) Care Management  02/16/2020  Nancy Castro 09-14-1951 [id number]   CSW received referral on 02/12/2020 for assistance with home repairs.  CSW made contact with pt and confirmed her identity.  CSW introduced self, role and reason for call. Pt provided verbal consent and wishes for CSW to talk with her daughter, Nancy Castro, who is there in the home with her.  CSW spoke with Nancy Castro who reports she has been staying with her mom while working remotely and plans to stay through the summer.  Per daughter, pt otherwise lives alone, drives and is independent.  Daughter shared that the pt owns her home and has had some drainage issues which she has reached out to the city about; possible options for the city to place a drain, etc.  Daughter also shared that the backyard neighbor is a Teacher, English as a foreign language and she believes part of the problem could be coming from that property. She is trying to speak with the Landlord/owner and is having some private agencies assess the problem too.  "It is frustrating and we are hoping for some resolution".  Pt's daughter did not mention any particular home repair needs but when asked said she was evaluating and talking with her mom about the needs.  CSW advised daughter there are some non-profit agencies and government funds available to help with home repairs IF the individual meets eligibility as well as the funds are not depleted. Also, daughter was advised their would be applications to complete to determine eligibility.  CSW offered to send info to them by mail of area programs.   Pt's daughter also voiced questions about getting her mom on some sort of vision/dental plan.  CSW educated daughter about the [facility] program where they can advise on providers and services covered.  CSW also encouraged daughter to call pt's insurance customer service line and inquire if the dental/vision coverage is an add-on option.   Daughter states they are aware of the OTC catalog quarterly  funds that pt's insurance offers and encouraged to make sure they take advantage of this each quarter.  CSW also suggested they log on to the member website for pt's insurance to review additional benefit/services that are offered/covered.   Daughter appreciative of the call and the info provided.  CSW also offered to mail an Advance Directive packet as they have been meaning to get it notarized and are not certain if they can locate the copy she had.    CSW provided contact info and encouraged them to call if further needs arise that CSW can assist with.  CSW will plan a follow up call in the next 2 weeks for further screening and assessment of needs.    CSW will share this note with PCP and [facility] team for collaboration.   Eduard Clos, MSW, [place] Worker  [place] [phone]

## 2020-02-19 ENCOUNTER — Encounter: Payer: Self-pay | Admitting: *Deleted

## 2020-02-20 ENCOUNTER — Encounter: Payer: Self-pay | Admitting: *Deleted

## 2020-02-20 ENCOUNTER — Other Ambulatory Visit: Payer: Self-pay | Admitting: *Deleted

## 2020-02-20 NOTE — Patient Outreach (Signed)
[facility]) Care Management  [place]  02/20/2020   Nancy Castro 1951/08/04 [id number]  Successful telephone outreach call to patient. HIPAA identifiers obtained. Patient lives alone but daughter is staying with her for the next several months to assist her after her recent hospital admission. She is independent with all ADLs , IADLs, and can transport herself to her medical appointments. Patient denies having any recent falls and reports that her home environment is safe. Her child Nancy Castro would be able to provide care for the patient if she were to need her assistance.   Subjective: Patient states she has had high blood pressure for a while. When she got diagnosed her B/P was as high as systolic [id number]. Currently, patient feels that her B/P has been controlled by her medication. She does try to eat a low sodium diet and is physically active by doing yard work and walking in her neighborhood twice weekly. Patient does not have a blood pressure cuff to monitor her B/P at home. Nurse explained that one could be purchased through [facility] product booklet and patient verbalized understanding. Nurse provided verbal hypertension education and patient does want hypertension education materials to be sent to her home. Patient explained that often she does not eat 3 meals a day. She is not hungry or she forgets and she has been eating food that is healthy for her unlike in the past. As a result she has lost weight; nurse provided education about nutrition and will send patient Ensure supplement coupons. Patient states that she often gets stressed, anxious, and overwhelmed. She is better now that her daughter is with her but it makes her anxious to think about maintaining her home by herself and she admits that she has been feeling alone and depressed. Patient does feel it would help to speak with LCSW regarding her emotions. Nurse will inform [facility] LCSW who is now assisting patient with  community resources. Patient did not have any other concerns at this time.  Encounter Medications:  Outpatient Encounter Medications as of 02/20/2020  Medication Sig Note  . aspirin 81 MG tablet Take 1 tablet (81 mg total) by mouth daily. 01/07/2020: Pt daughter verified medication.  Marland Kitchen atorvastatin (LIPITOR) 40 MG tablet TAKE 1 TABLET(40 MG) BY MOUTH DAILY AT 6 PM (Patient taking differently: Take 40 mg by mouth daily. ) 01/07/2020: Pt daughter verified medication.  Marland Kitchen losartan (COZAAR) 50 MG tablet TAKE 1 TABLET(50 MG) BY MOUTH DAILY (Patient taking differently: Take 50 mg by mouth daily. ) 01/07/2020: Pt daughter verified medication.  . MULTIPLE VITAMIN PO Take 1 tablet by mouth daily.  01/07/2020: Pt daughter verified medication.  . niacin 250 MG tablet Take 1 tablet (250 mg total) by mouth at bedtime. 2 tablets OTC daily (Patient taking differently: Take 500 mg by mouth at bedtime. 2 tablets OTC daily) 01/07/2020: Pt daughter verified medication.  . Vitamin D, Cholecalciferol, 10 MCG (400 UNIT) TABS Take 2 tablets by mouth daily. 01/07/2020: Pt daughter verified medication.   No facility-administered encounter medications on file as of 02/20/2020.    Functional Status:  In your present state of health, do you have any difficulty performing the following activities: 02/20/2020 01/07/2020  Hearing? N N  Vision? N N  Difficulty concentrating or making decisions? Tempie Donning  Comment has had episoeds of mild cognitive impairment, daughter and patient states, she is independent with ADLs and IADLs -  Walking or climbing stairs? N Y  Dressing or bathing? Aggie Moats  Doing errands, shopping? N N  Preparing Food and eating ? N -  Using the Toilet? N -  In the past six months, have you accidently leaked urine? N -  Do you have problems with loss of bowel control? N -  Managing your Medications? N -  Managing your Finances? N -  Housekeeping or managing your Housekeeping? N -  Some recent data might be hidden     Fall/Depression Screening: Fall Risk  02/20/2020 12/13/2018 08/25/2017  Falls in the past year? 0 0 No  Number falls in past yr: 0 - -  Injury with Fall? 0 - -  Follow up Falls prevention discussed;Education provided;Falls evaluation completed - -   PHQ 2/9 Scores 02/20/2020 12/13/2018 08/25/2017 02/05/2016 08/07/2015  PHQ - 2 Score 2 0 0 0 0  PHQ- 9 Score 6 - - - -   THN CM Care Plan Problem One     Most Recent Value  Care Plan Problem One  Knowledge deficient related to hypertension condition, treatment and lifestyle changes as evidenced by request for information  Role Documenting the Problem One  [place] for Problem One  Active  [facility] Long Term Goal   Patient will verbalize knowledge of medications and treatment of hypertension within the next 90 days  THN Long Term Goal Start Date  02/20/20  Interventions for Problem One Long Term Goal  RN discussed the importance of taking B/P daily and recording the data, encouraged medication and low sodium diet adherence, sent a matter of choices blood pressure control booklet, discussed increased stress levels and how stress can elevate B/P, reviewed signs and symptoms of stroke.  THN CM Short Term Goal #1   Patient will get a blood pressure cuff to monitor her B/P at home within the next 30 days  THN CM Short Term Goal #1 Start Date  02/20/20  Interventions for Short Term Goal #1  Nurse educated about the importance of monitoring blood pressure daily, nurse encouraged patient to order blood pressure cuff from [facility] product booklet, patient verbalized understanding and stated she would order the blood pressure cuff      Plan: RN Health Coach will send Barrier Letter and today's assessment note to PCP, will send hypertension education and [facility] Welcome Packet, will send resources for home repair, dental care, life alert brochure, and social activities for seniors. RN Health Coach will call patient within the month of May and patient agrees  to future outreach calls.   Emelia Loron RN, BSN [place] [phone] [email or web address]

## 2020-02-21 ENCOUNTER — Other Ambulatory Visit: Payer: Self-pay

## 2020-02-21 ENCOUNTER — Ambulatory Visit (INDEPENDENT_AMBULATORY_CARE_PROVIDER_SITE_OTHER): Payer: Medicare Other | Admitting: Medical

## 2020-02-21 ENCOUNTER — Encounter: Payer: Self-pay | Admitting: Medical

## 2020-02-21 VITALS — BP 138/80 | HR 59 | Temp 98.2°F | Ht 61.0 in | Wt 133.0 lb

## 2020-02-21 DIAGNOSIS — I10 Essential (primary) hypertension: Secondary | ICD-10-CM | POA: Diagnosis not present

## 2020-02-21 DIAGNOSIS — M150 Primary generalized (osteo)arthritis: Secondary | ICD-10-CM

## 2020-02-21 DIAGNOSIS — E559 Vitamin D deficiency, unspecified: Secondary | ICD-10-CM

## 2020-02-21 DIAGNOSIS — E782 Mixed hyperlipidemia: Secondary | ICD-10-CM | POA: Diagnosis not present

## 2020-02-21 DIAGNOSIS — Z9181 History of falling: Secondary | ICD-10-CM

## 2020-02-21 DIAGNOSIS — Z7185 Encounter for immunization safety counseling: Secondary | ICD-10-CM

## 2020-02-21 DIAGNOSIS — M1711 Unilateral primary osteoarthritis, right knee: Secondary | ICD-10-CM

## 2020-02-21 DIAGNOSIS — G3184 Mild cognitive impairment, so stated: Secondary | ICD-10-CM | POA: Diagnosis not present

## 2020-02-21 DIAGNOSIS — D649 Anemia, unspecified: Secondary | ICD-10-CM

## 2020-02-21 DIAGNOSIS — M8949 Other hypertrophic osteoarthropathy, multiple sites: Secondary | ICD-10-CM

## 2020-02-21 DIAGNOSIS — Z0000 Encounter for general adult medical examination without abnormal findings: Secondary | ICD-10-CM | POA: Diagnosis not present

## 2020-02-21 DIAGNOSIS — E2839 Other primary ovarian failure: Secondary | ICD-10-CM

## 2020-02-21 DIAGNOSIS — M159 Polyosteoarthritis, unspecified: Secondary | ICD-10-CM

## 2020-02-21 DIAGNOSIS — Z780 Asymptomatic menopausal state: Secondary | ICD-10-CM

## 2020-02-21 DIAGNOSIS — K635 Polyp of colon: Secondary | ICD-10-CM

## 2020-02-21 DIAGNOSIS — Z7189 Other specified counseling: Secondary | ICD-10-CM

## 2020-02-21 NOTE — Progress Notes (Signed)
Subjective:    Nancy Castro is a 69 y.o. female who presents for Preventative Services visit and chronic medical problems/med check visit.    Here with daughter, [name] in office today.  Primary Care Provider Morayma [place], Camelia Eng, PA-C here for primary care  Current Health Care Team:  Dentist, [facility] doctor, N/A  Dr. Zenovia Jarred, GI  Dr. Ellouise Newer, neurology   Medical Services you may have received from other than [facility] providers in the past year (date may be approximate) none  Exercise Current exercise habits: The patient does not participate in regular exercise at present.  Mowing the yard  Nutrition/Diet Current diet: in general, a "healthy" diet  .   Eating on average 2 meals per day but trying to eat 3 meals per day to keep weight stable.  Depression Screen Depression screen [facility] 2/9 02/21/2020  Decreased Interest 0  Down, Depressed, Hopeless 0  PHQ - 2 Score 0  Altered sleeping 0  Tired, decreased energy 0  Change in appetite 0  Feeling bad or failure about yourself  0  Trouble concentrating 0  Moving slowly or fidgety/restless 0  Suicidal thoughts 0  PHQ-9 Score 0  Difficult doing work/chores -    Activities of Daily Living Screen/Functional Status Survey Is the patient deaf or have difficulty hearing?: No Does the patient have difficulty seeing, even when wearing glasses/contacts?: No Does the patient have difficulty concentrating, remembering, or making decisions?: No Does the patient have difficulty walking or climbing stairs?: Yes Does the patient have difficulty dressing or bathing?: No Does the patient have difficulty doing errands alone such as visiting a doctor's office or shopping?: No  Can patient draw a clock face showing 3:15 o'clock,yes  Fall Risk Screen Fall Risk  02/21/2020 02/20/2020 12/13/2018 08/25/2017 02/05/2016  Falls in the past year? 0 0 0 No No  Number falls in past yr: - 0 - - -  Injury with Fall? - 0 - - -  Follow up -  Falls prevention discussed;Education provided;Falls evaluation completed - - -    Gait Assessment: Normal gait observed - yes  Advanced directives Does patient have a [place]? No Does patient have a Living Will? No  Past Medical History:  Diagnosis Date  . DDD (degenerative disc disease), cervical   . H/O echocardiogram 2007  . History of exercise stress test 2007  . Hyperlipidemia   . Hypertension   . Mixed dyslipidemia   . Osteoarthritis   . Syncope 2012   cardiac stress test   . Wears glasses   . Wears partial dentures    upper and lower    Past Surgical History:  Procedure Laterality Date  . COLONOSCOPY     2014, polyps, repeat 3 years  . COLONOSCOPY  07/14/2018   tubular adenoma polyps, repeat 3 years, Dr. Zenovia Jarred  . KNEE ARTHROSCOPY     right  . LIPOMA EXCISION     back x 2  . PARTIAL HYSTERECTOMY     fibroids; has both ovaries    Social History   Socioeconomic History  . Marital status: Divorced    Spouse name: Not on file  . Number of children: 2  . Years of education: 51  . Highest education level: Not on file  Occupational History  . Occupation: Retired  Tobacco Use  . Smoking status: Former Smoker    Packs/day: 0.25    Years: 5.00    Pack years:  1.25    Quit date: 02/08/1987    Years since quitting: 33.0  . Smokeless tobacco: Former Systems developer    Types: Chew  . Tobacco comment: Stopped chewing years  ago.  Substance and Sexual Activity  . Alcohol use: No    Alcohol/week: 0.0 standard drinks  . Drug use: No  . Sexual activity: Not Currently    Birth control/protection: Surgical    Comment: 1986  Other Topics Concern  . 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  No grandchildren.   Sews - hobby.  12/2018   Social Determinants of Health   Financial Resource Strain: Low Risk   . Difficulty of Paying Living  Expenses: Not very hard  Food Insecurity: No Food Insecurity  . Worried About Charity fundraiser in the Last Year: Never true  . Ran Out of Food in the Last Year: Never true  Transportation Needs: No Transportation Needs  . Lack of Transportation (Medical): No  . Lack of Transportation (Non-Medical): No  Physical Activity:   . Days of Exercise per Week:   . Minutes of Exercise per Session:   Stress:   . Feeling of Stress :   Social Connections:   . Frequency of Communication with Friends and Family:   . Frequency of Social Gatherings with Friends and Family:   . Attends Religious Services:   . Active Member of Clubs or Organizations:   . Attends Archivist Meetings:   Marland Kitchen Marital Status:   Intimate Partner Violence:   . Fear of Current or Ex-Partner:   . Emotionally Abused:   Marland Kitchen Physically Abused:   . Sexually Abused:     Family History  Problem Relation Age of Onset  . Cancer Mother        died of breast cancer  . Cancer Father        father lung cancer  . Cancer Sister        lung, mets to brain  . Cancer Brother        lung and stomach  . Stomach cancer Brother        died at age 48  . Hypertension Maternal Grandmother   . Cancer Brother        lung  . Diabetes Brother   . Heart disease Neg Hx   . Stroke Neg Hx   . Colon cancer Neg Hx   . Rectal cancer Neg Hx      Current Outpatient Medications:  .  aspirin 81 MG tablet, Take 1 tablet (81 mg total) by mouth daily., Disp: 90 tablet, Rfl: 3 .  atorvastatin (LIPITOR) 40 MG tablet, TAKE 1 TABLET(40 MG) BY MOUTH DAILY AT 6 PM (Patient taking differently: Take 40 mg by mouth daily. ), Disp: 90 tablet, Rfl: 0 .  losartan (COZAAR) 50 MG tablet, TAKE 1 TABLET(50 MG) BY MOUTH DAILY (Patient taking differently: Take 50 mg by mouth daily. ), Disp: 90 tablet, Rfl: 0 .  MULTIPLE VITAMIN PO, Take 1 tablet by mouth daily. , Disp: , Rfl:  .  niacin 250 MG tablet, Take 1 tablet (250 mg total) by mouth at bedtime. 2  tablets OTC daily (Patient taking differently: Take 500 mg by mouth at bedtime. 2 tablets OTC daily), Disp: 180 tablet, Rfl: 3 .  Vitamin D, Cholecalciferol, 10 MCG (400 UNIT) TABS, Take 2 tablets by mouth  daily., Disp: 180 tablet, Rfl: 3  No Known Allergies  History reviewed: allergies, current medications, past family history, past medical history, past social history, past surgical history and problem list  Chronic issues discussed: Compliant with medication  Acute issues discussed: Recently did health coach and social work consults which she and daughter found helpful.  She is requesting shower bench for safety.  She has safety rail in her shower..   Objective:      Biometrics BP 138/80   Pulse (!) 59   Temp 98.2 F (36.8 C)   Ht 5\' 1"  (1.549 m)   Wt 133 lb (60.3 kg)   SpO2 98%   BMI 25.13 kg/m   Wt Readings from Last 3 Encounters:  02/21/20 133 lb (60.3 kg)  01/17/20 129 lb (58.5 kg)  01/07/20 134 lb 3.2 oz (60.9 kg)   BP Readings from Last 3 Encounters:  02/21/20 138/80  01/17/20 130/80  01/10/20 137/74     Cognitive Testing  Alert? Yes  Normal Appearance?Yes  Oriented to person? Yes  Place? Yes   Time? Yes  Recall of three objects?  Yes  Can perform simple calculations? Yes  Displays appropriate judgment?Yes  Can read the correct time from a watch face?Yes  General appearance: alert, no distress, WD/WN, African American female  Nutritional Status: Inadequate calore intake? no Loss of muscle mass? no Loss of fat beneath skin? no Localized or general edema? no Diminished functional status? some  Other pertinent exam: oral cavity: MMM, no lesions Neck: supple, no lymphadenopathy, no thyromegaly, no masses Heart: RRR, normal S1, S2, no murmurs Lungs: CTA bilaterally, no wheezes, rhonchi, or rales Abdomen: +bs, soft, non tender, non distended, no masses, no hepatomegaly, no splenomegaly Musculoskeletal: nontender, no swelling, no obvious  deformity Extremities: no edema, no cyanosis, no clubbing Pulses: 2+ symmetric, upper and lower extremities, normal cap refill Neurological: alert, oriented x 3, CN2-12 intact, strength normal upper extremities and lower extremities, sensation normal throughout, DTRs 2+ throughout, no cerebellar signs, gait normal Psychiatric: normal affect, behavior normal, pleasant    Assessment:   Encounter Diagnoses  Name Primary?  . Essential hypertension Yes  . Routine general medical examination at a health care facility   . Mild cognitive impairment   . Mixed dyslipidemia   . Arthritis of right knee   . Primary osteoarthritis involving multiple joints   . Polyp of colon, unspecified part of colon, unspecified type   . Postmenopausal   . Medicare annual wellness visit, subsequent   . Vitamin D deficiency   . History of fall   . Estrogen deficiency   . Anemia, unspecified type   . Vaccine counseling      Plan:   A preventative services visit was completed today.  During the course of the visit today, we discussed and counseled about appropriate screening and preventive services.  A health risk assessment was established today that included a review of current medications, allergies, social history, family history, medical and preventative health history, biometrics, and preventative screenings to identify potential safety concerns or impairments.  A personalized plan was printed today for your records and use.   Personalized health advice and education was given today to reduce health risks and promote self management and wellness.  Information regarding end of life planning was discussed today.  Conditions/risks identified: Fall risk, nutritions, ADLs, memory,   Specific issues discussed today: Memory, ADLs, medications, safety, coorespondance between patient and her family using video chats.  Recommendations:  I recommend a yearly ophthalmology/optometry visit for glaucoma  screening and eye checkup  I recommended a yearly dental visit for hygiene and checkup  Advanced directives - discussed nature and purpose of Advanced Directives, encouraged them to complete them if they have not done so and/or encouraged them to get [place] a copy if they have done this already.  Patient Instructions  Vaccines: Call insurance to see if they cover Tetanus diptheria pertussis booster also known as Tdap.   You are due for this.  Shingles vaccine:  I recommend you have a shingles vaccine to help prevent shingles or herpes zoster outbreak.   Please call your insurer to inquire about coverage for the Shingrix vaccine given in 2 doses.   Some insurers cover this vaccine after age 75, some cover this after age 21.  If your insurer covers this, then call to schedule appointment to have this vaccine here.  Thankfully you had your Covid vaccine  We recommend yearly flu shot in the fall  You are up to date on Pneumonia 23 vaccine.    However, you still need Prevnar 13 pneumonia vaccine.    Cancer screening: You will be due for another colonoscopy in 2022.   You are due for mammogram now.    Please call to schedule your mammogram.   [facility] [address] [place], [place], [place] [address] [phone]  If you do not have a uterus but still have ovaries, you should have a pelvic exam periodically.      We check routine labs for abnormalities  Check your skin regularly for new moles or skin changes    Other screenings See your eye doctor yearly for routine vision care.  See your dentist yearly for routine dental care including hygiene visits twice yearly.  You should have a bone density test every 2 years  [facility] [address] [place], [place], [place] [address] [phone]   We will work on ordering a shower bench   Work on completing [place] was seen today for [employer].  Diagnoses and all  orders for this visit:  Essential hypertension -     Lipid panel  Routine general medical examination at a health care facility -     CBC -     Lipid panel  Mild cognitive impairment  Mixed dyslipidemia  Arthritis of right knee  Primary osteoarthritis involving multiple joints  Polyp of colon, unspecified part of colon, unspecified type  Postmenopausal  Medicare annual wellness visit, subsequent  Vitamin D deficiency  History of fall  Estrogen deficiency  Anemia, unspecified type -     CBC  Vaccine counseling     Medicare Attestation A preventative services visit was completed today.  During the course of the visit the patient was educated and counseled about appropriate screening and preventive services.  A health risk assessment was established with the patient that included a review of current medications, allergies, social history, family history, medical and preventative health history, biometrics, and preventative screenings to identify potential safety concerns or impairments.  A personalized plan was printed today for the patient's records and use.   Personalized health advice and education was given today to reduce health risks and promote self management and wellness.  Information regarding end of life planning was discussed today.  Dorothea Ogle, PA-C   02/21/2020

## 2020-02-21 NOTE — Patient Instructions (Addendum)
Vaccines: Call insurance to see if they cover Tetanus diptheria pertussis booster also known as Tdap.   You are due for this.  Shingles vaccine:  I recommend you have a shingles vaccine to help prevent shingles or herpes zoster outbreak.   Please call your insurer to inquire about coverage for the Shingrix vaccine given in 2 doses.   Some insurers cover this vaccine after age 69, some cover this after age 70.  If your insurer covers this, then call to schedule appointment to have this vaccine here.  Thankfully you had your Covid vaccine  We recommend yearly flu shot in the fall  You are up to date on Pneumonia 23 vaccine.    However, you still need Prevnar 13 pneumonia vaccine.    Cancer screening: You will be due for another colonoscopy in 2022.   You are due for mammogram now.    Please call to schedule your mammogram.   [facility] [address] [place], [place], [place] [address] [phone]  If you do not have a uterus but still have ovaries, you should have a pelvic exam periodically.      We check routine labs for abnormalities  Check your skin regularly for new moles or skin changes    Other screenings See your eye doctor yearly for routine vision care.  See your dentist yearly for routine dental care including hygiene visits twice yearly.  You should have a bone density test every 2 years  [facility] [address] [place], [place], [place] [address] [phone]   We will work on ordering a shower bench   Work on Information systems manager

## 2020-02-22 LAB — CBC
Hematocrit: 36.3 % (ref 34.0–46.6)
Hemoglobin: 11.9 g/dL (ref 11.1–15.9)
MCH: 31.9 pg (ref 26.6–33.0)
MCHC: 32.8 g/dL (ref 31.5–35.7)
MCV: 97 fL (ref 79–97)
Platelets: 280 10*3/uL (ref 150–450)
RBC: 3.73 x10E6/uL — ABNORMAL LOW (ref 3.77–5.28)
RDW: 11.9 % (ref 11.7–15.4)
WBC: 5.7 10*3/uL (ref 3.4–10.8)

## 2020-02-22 LAB — LIPID PANEL
Chol/HDL Ratio: 2.4 ratio (ref 0.0–4.4)
Cholesterol, Total: 209 mg/dL — ABNORMAL HIGH (ref 100–199)
HDL: 87 mg/dL (ref >39)
LDL Chol Calc (NIH): 114 mg/dL — ABNORMAL HIGH (ref 0–99)
Triglycerides: 46 mg/dL (ref 0–149)
VLDL Cholesterol Cal: 8 mg/dL (ref 5–40)

## 2020-02-23 ENCOUNTER — Encounter: Payer: Self-pay | Admitting: Medical

## 2020-02-23 ENCOUNTER — Other Ambulatory Visit: Payer: Self-pay | Admitting: Medical

## 2020-02-23 DIAGNOSIS — I10 Essential (primary) hypertension: Secondary | ICD-10-CM

## 2020-02-23 DIAGNOSIS — R2681 Unsteadiness on feet: Secondary | ICD-10-CM | POA: Insufficient documentation

## 2020-02-23 DIAGNOSIS — E782 Mixed hyperlipidemia: Secondary | ICD-10-CM

## 2020-02-23 DIAGNOSIS — Z9181 History of falling: Secondary | ICD-10-CM | POA: Insufficient documentation

## 2020-02-23 MED ORDER — LOSARTAN POTASSIUM 50 MG PO TABS: 50.0000 mg | ORAL_TABLET | Freq: Every day | ORAL | 3 refills | Status: DC

## 2020-02-23 MED ORDER — VITAMIN D 25 MCG (1000 UNIT) PO TABS: 1000.0000 [IU] | ORAL_TABLET | Freq: Every day | ORAL | 3 refills | Status: DC

## 2020-02-23 MED ORDER — NIACIN 250 MG PO TABS: 500.0000 mg | ORAL_TABLET | Freq: Every day | ORAL | 3 refills | Status: DC

## 2020-02-23 MED ORDER — ATORVASTATIN CALCIUM 40 MG PO TABS: 40.0000 mg | ORAL_TABLET | Freq: Every day | ORAL | 3 refills | Status: DC

## 2020-02-26 ENCOUNTER — Telehealth: Payer: Self-pay | Admitting: Medical

## 2020-02-26 ENCOUNTER — Other Ambulatory Visit: Payer: Self-pay | Admitting: *Deleted

## 2020-02-26 ENCOUNTER — Other Ambulatory Visit: Payer: Self-pay

## 2020-02-26 NOTE — Telephone Encounter (Signed)
Please verify with [facility] what their policy is.  I usually just have the patient call and schedule her own mammogram and bone density there, although there is already an electronic order in the system for our records.  [facility] is not in the epic electronic system, and we do not have an easy way to send them an order other than taking time to fax the order over  [facility] [address] [place], [place], [place] [address] [phone]

## 2020-02-26 NOTE — Telephone Encounter (Signed)
Pt daughter [name] called and needs to have an order put in for [facility] to have a bone density done for her mom. Please call [name] when the order is put in so she can schedule mammogram and bone density at the same time [phone]

## 2020-02-26 NOTE — Telephone Encounter (Signed)
I have faxed order over to [facility] and provided patient contact information.

## 2020-02-27 NOTE — Patient Outreach (Signed)
[facility]) Care Management  02/27/2020  Nancy Castro 1951-10-08 [id number]   CSW spoke with pt who reports her daughter was able to make some calls and and get pt linked with vision and dental coverage. "I have a dental appointment this week and going to schedule a vision".  She also is planning to have a mammography and bone density.  She is overall feeling "pretty good" and denies any current anxiety or depression. She is relieved her daughter is staying through August and yet is anxious about being alone when she leaves. Pr is interested in getting out and being more active- CSW suggested some resources for seniors and she is already planning to get involved with the [facility] when [place] allows.  She is trying to exercise more in the neighborhood and hopes it will help with her weight loss goals too.   CSW was not able to get any updates from daughter- will plan a follow up call to pt and daughter 5/10.   Eduard Clos, MSW, [place] Worker  [place] [phone]

## 2020-02-28 LAB — HM DEXA SCAN

## 2020-02-28 LAB — HM MAMMOGRAPHY

## 2020-02-29 ENCOUNTER — Telehealth: Payer: Self-pay | Admitting: Medical

## 2020-02-29 NOTE — Telephone Encounter (Signed)
Message sent via my chart

## 2020-02-29 NOTE — Telephone Encounter (Signed)
I am happy to report that her mammogram was normal, no worrisome findings.

## 2020-03-01 ENCOUNTER — Encounter: Payer: Self-pay | Admitting: Medical

## 2020-03-01 ENCOUNTER — Telehealth: Payer: Self-pay | Admitting: Medical

## 2020-03-01 NOTE — Telephone Encounter (Signed)
Received requested records from [facility]

## 2020-03-04 ENCOUNTER — Telehealth: Payer: Self-pay | Admitting: Medical

## 2020-03-04 NOTE — Telephone Encounter (Signed)
Message sent via my chart

## 2020-03-04 NOTE — Telephone Encounter (Signed)
Bone density test shows osteopenia or low bone mass, but not Osteoporosis   Recommendations:  Exercise regularly including aerobic and weight bearing exercise.   Weight-bearing physical activity and exercises that improve balance and posture can strengthen bones and reduce the chance of a fracture. The more active and fit you are as you age, the less likely you are to fall and break a bone.   Good nutrition. Eat a healthy diet and make certain that you're getting 1200mg  of calcium daily in the diet from dairy (typically 4 servings) or supplements OTC.   Also they should be getting Vitamin D through eating fish, seafood, getting sun exposure, and using either OTC Vitamin D supplement such as 1000-2000 IU daily unless instructed by [place] otherwise.   If they smoke they should quit smoking. Smoking cigarettes speeds up bone loss.  Limit alcohol. If you choose to drink alcohol, do so in moderation. For healthy women, that means up to one drink a day.  Lets plan to repeat this test in 2 years.

## 2020-03-05 ENCOUNTER — Encounter: Payer: Self-pay | Admitting: Neurology

## 2020-03-05 ENCOUNTER — Other Ambulatory Visit: Payer: Self-pay

## 2020-03-05 ENCOUNTER — Ambulatory Visit (INDEPENDENT_AMBULATORY_CARE_PROVIDER_SITE_OTHER): Payer: Medicare Other | Admitting: Neurology

## 2020-03-05 VITALS — BP 122/73 | HR 68 | Temp 97.4°F | Ht 61.0 in | Wt 137.5 lb

## 2020-03-05 DIAGNOSIS — D329 Benign neoplasm of meninges, unspecified: Secondary | ICD-10-CM | POA: Diagnosis not present

## 2020-03-05 DIAGNOSIS — G3184 Mild cognitive impairment, so stated: Secondary | ICD-10-CM | POA: Diagnosis not present

## 2020-03-05 DIAGNOSIS — G9340 Encephalopathy, unspecified: Secondary | ICD-10-CM | POA: Diagnosis not present

## 2020-03-05 DIAGNOSIS — R404 Transient alteration of awareness: Secondary | ICD-10-CM | POA: Insufficient documentation

## 2020-03-05 DIAGNOSIS — R4189 Other symptoms and signs involving cognitive functions and awareness: Secondary | ICD-10-CM

## 2020-03-05 NOTE — Progress Notes (Signed)
[facility]  PATIENT: Nancy Castro DOB: 07-Mar-1951  REFERRING DOCTOR OR PCP: Chana Bode, PA-C SOURCE: Patient, notes from recent hospital admission, laboratory and imaging reports, imaging studies personally reviewed.  _________________________________   HISTORICAL  CHIEF COMPLAINT:  Chief Complaint  Patient presents with  . New Patient (Initial Visit)    RM 65 with daughter, [name] (temp 97.1). Internal referral from Chana Bode, PA for encephalopathy. Admitted at [facility] from 01/07/2020 through 01/10/2020 for altered mental status, encephalopathy. She declined PT that was offered by PCP. Doing well, no concerns since being out of the hospital.    HISTORY OF PRESENT ILLNESS:  I had the pleasure seeing your patient, Nancy Castro, at [facility] for neurologic consultation regarding her recent episode of unresponsiveness followed by altered mental status felt to be encephalopathy.  She is a 69 year old woman admitted to [facility] for encephalopathy 01/07/2020.   She was baseline 01/06/2020 but the next day she was found unresponsive by her daughter.  The daughter came to her house as she had been unable to reach her by phone.  When the daughter arrived, Ms. Morehart was in bed and did not respond to voice.  After shaking her she became partially aware but was not speaking and not interacting.   911 was called and she was taken to [facility] ED.   She was admitted and had labs and imaging studies.   The MRI of the brain did not show any acute findings though did show a small chronic meningioma.  No reason was identified for her episode.   She improved each day and was essentially baseline 01/10/20 and was discharged.   She has been at her baseline since.   She has no history of seizures or other episodes of unresponsiveness.  The daughter notes that there was no incontinence or evidence of tongue biting when she came into her mom's health  I personally  reviewed the CT scan from 01/07/2020 and the MRI from 01/08/2020.  The actual brain is normal for age.  There are no hemorrhages or evidence of acute stroke.  There are 2 small extra-axial masses consistent with calcified meningiomas or less likely osteomas.  These were also present on a CT scan from 2012.  Laboratory testing during her hospitalization showed normal electrolytes.  Liver transaminases were normal but total bilirubin was elevated.  Direct bilirubin was normal.  Ammonia was normal.  TSH was normal.  HIV was negative.  IgG was normal but IgM was reduced.  In 2016, she had an MRI due to memory loss and there were no changes.  REVIEW OF SYSTEMS: Constitutional: No fevers, chills, sweats, or change in appetite Eyes: No visual changes, double vision, eye pain Ear, nose and throat: No hearing loss, ear pain, nasal congestion, sore throat Cardiovascular: No chest pain, palpitations Respiratory: No shortness of breath at rest or with exertion.   No wheezes GastrointestinaI: No nausea, vomiting, diarrhea, abdominal pain, fecal incontinence Genitourinary: No dysuria, urinary retention or frequency.  No nocturia. Musculoskeletal: No neck pain, back pain Integumentary: No rash, pruritus, skin lesions Neurological: as above Psychiatric: No depression at this time.  No anxiety Endocrine: No palpitations, diaphoresis, change in appetite, change in weigh or increased thirst Hematologic/Lymphatic: No anemia, purpura, petechiae. Allergic/Immunologic: No itchy/runny eyes, nasal congestion, recent allergic reactions, rashes  ALLERGIES: No Known Allergies  HOME MEDICATIONS:  Current Outpatient Medications:  .  atorvastatin (LIPITOR) 40 MG tablet, Take 1 tablet (40 mg total)  by mouth daily., Disp: 90 tablet, Rfl: 3 .  cholecalciferol (VITAMIN D3) 25 MCG (1000 UNIT) tablet, Take 1 tablet (1,000 Units total) by mouth daily., Disp: 90 tablet, Rfl: 3 .  losartan (COZAAR) 50 MG tablet, Take 1 tablet  (50 mg total) by mouth daily., Disp: 90 tablet, Rfl: 3 .  MULTIPLE VITAMIN PO, Take 1 tablet by mouth daily. , Disp: , Rfl:  .  niacin 250 MG tablet, Take 2 tablets (500 mg total) by mouth at bedtime. 2 tablets OTC daily, Disp: 180 tablet, Rfl: 3  PAST MEDICAL HISTORY: Past Medical History:  Diagnosis Date  . DDD (degenerative disc disease), cervical   . H/O echocardiogram 2007  . History of exercise stress test 2007  . Hyperlipidemia   . Hypertension   . Mixed dyslipidemia   . Osteoarthritis   . Syncope 2012   cardiac stress test   . Wears glasses   . Wears partial dentures    upper and lower    PAST SURGICAL HISTORY: Past Surgical History:  Procedure Laterality Date  . COLONOSCOPY     2014, polyps, repeat 3 years  . COLONOSCOPY  07/14/2018   tubular adenoma polyps, repeat 3 years, Dr. Zenovia Jarred  . KNEE ARTHROSCOPY     right  . LIPOMA EXCISION     back x 2  . PARTIAL HYSTERECTOMY     fibroids; has both ovaries    FAMILY HISTORY: Family History  Problem Relation Age of Onset  . Cancer Mother        died of breast cancer  . Cancer Father        father lung cancer  . Cancer Sister        lung, mets to brain  . Cancer Brother        lung and stomach  . Stomach cancer Brother        died at age 54  . Hypertension Maternal Grandmother   . Cancer Brother        lung  . Diabetes Brother   . Heart disease Neg Hx   . Stroke Neg Hx   . Colon cancer Neg Hx   . Rectal cancer Neg Hx     SOCIAL HISTORY:  Social History   Socioeconomic History  . Marital status: Divorced    Spouse name: Not on file  . Number of children: 2  . Years of education: 29  . Highest education level: Not on file  Occupational History  . Occupation: Retired  Tobacco Use  . Smoking status: Former Smoker    Packs/day: 0.25    Years: 5.00    Pack years: 1.25    Quit date: 02/08/1987    Years since quitting: 33.0  . Smokeless tobacco: Former Systems developer    Types: Chew  . Tobacco comment:  Stopped chewing years  ago.  Substance and Sexual Activity  . Alcohol use: No    Alcohol/week: 0.0 standard drinks  . Drug use: No  . Sexual activity: Not Currently    Birth control/protection: Surgical    Comment: 1986  Other Topics Concern  . 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  No grandchildren.   Sews - hobby.  12/2018   Right handed   1 cup per day of caffeine use:    Social Determinants of Health   Financial Resource Strain: Low Risk   .  Difficulty of Paying Living Expenses: Not very hard  Food Insecurity: No Food Insecurity  . Worried About Charity fundraiser in the Last Year: Never true  . Ran Out of Food in the Last Year: Never true  Transportation Needs: No Transportation Needs  . Lack of Transportation (Medical): No  . Lack of Transportation (Non-Medical): No  Physical Activity:   . Days of Exercise per Week:   . Minutes of Exercise per Session:   Stress:   . Feeling of Stress :   Social Connections:   . Frequency of Communication with Friends and Family:   . Frequency of Social Gatherings with Friends and Family:   . Attends Religious Services:   . Active Member of Clubs or Organizations:   . Attends Archivist Meetings:   Marland Kitchen Marital Status:   Intimate Partner Violence:   . Fear of Current or Ex-Partner:   . Emotionally Abused:   Marland Kitchen Physically Abused:   . Sexually Abused:      PHYSICAL EXAM  Vitals:   03/05/20 1110  BP: 122/73  Pulse: 68  Temp: (!) 97.4 F (36.3 C)  Weight: 137 lb 8 oz (62.4 kg)  Height: 5\' 1"  (1.549 m)    Body mass index is 25.98 kg/m.   General: The patient is well-developed and well-nourished and in no acute distress  HEENT:  Head is [place]/AT.  Sclera are anicteric.  Funduscopic exam shows normal optic discs and retinal vessels.  Neck: No carotid bruits are noted.  The neck is  nontender.  Cardiovascular: The heart has a regular rate and rhythm with a normal S1 and S2. There were no murmurs, gallops or rubs.    Skin: Extremities are without rash or  edema.  Musculoskeletal:  Back is nontender  Neurologic Exam  Mental status: The patient is alert and oriented x 3 at the time of the examination.    Speech is normal.  Cranial nerves: Extraocular movements are full. Pupils are equal, round, and reactive to light and accomodation.  Visual fields are full.  Facial symmetry is present. There is good facial sensation to soft touch bilaterally.Facial strength is normal.  Trapezius and sternocleidomastoid strength is normal. No dysarthria is noted.  The tongue is midline, and the patient has symmetric elevation of the soft palate. No obvious hearing deficits are noted.  Motor:  Muscle bulk is normal.   Tone is normal. Strength is  5 / 5 in all 4 extremities.   Sensory: Sensory testing is intact to pinprick, soft touch and vibration sensation in all 4 extremities.  Coordination: Cerebellar testing reveals good finger-nose-finger and heel-to-shin bilaterally.  Gait and station: Station is normal.   Gait is normal. Tandem gait is normal. Romberg is negative.   Reflexes: Deep tendon reflexes are symmetric and normal bilaterally.   Plantar responses are flexor.     DIAGNOSTIC DATA (LABS, IMAGING, TESTING) - I reviewed patient records, labs, notes, testing and imaging myself where available.  Lab Results  Component Value Date   WBC 5.7 02/21/2020   HGB 11.9 02/21/2020   HCT 36.3 02/21/2020   MCV 97 02/21/2020   PLT 280 02/21/2020      Component Value Date/Time   NA 142 01/17/2020 1433   K 4.5 01/17/2020 1433   CL 106 01/17/2020 1433   CO2 24 01/17/2020 1433   GLUCOSE 85 01/17/2020 1433   GLUCOSE 112 (H) 01/10/2020 0622   BUN 11 01/17/2020 1433   CREATININE 0.57 01/17/2020 1433  CREATININE 0.62 08/25/2017 1005   CALCIUM 9.9 01/17/2020 1433   PROT 6.5  01/10/2020 0622   PROT 6.7 12/13/2018 1020   ALBUMIN 3.7 01/10/2020 0622   ALBUMIN 4.8 12/13/2018 1020   AST 29 01/10/2020 0622   ALT 24 01/10/2020 0622   ALKPHOS 58 01/10/2020 0622   BILITOT 2.3 (H) 01/10/2020 0622   BILITOT 0.5 12/13/2018 1020   GFRNONAA 95 01/17/2020 1433   GFRAA 109 01/17/2020 1433   Lab Results  Component Value Date   CHOL 209 (H) 02/21/2020   HDL 87 02/21/2020   LDLCALC 114 (H) 02/21/2020   TRIG 46 02/21/2020   CHOLHDL 2.4 02/21/2020   Lab Results  Component Value Date   HGBA1C 5.3 12/08/2017   Lab Results  Component Value Date   VITAMINB12 500 10/24/2014   Lab Results  Component Value Date   TSH 2.180 01/07/2020       ASSESSMENT AND PLAN  Encephalopathy acute  Mild cognitive impairment  Meningioma (HCC)  Unresponsive episode   In summary, Ms. Sherryl Barters is a 69 year old woman with several days of altered mental status beginning with unresponsiveness.  She is back to her baseline according to the daughter.  The etiology of the event is unclear.  Imaging studies and lab work did not identify a definite cause of the unresponsiveness.  An EEG was not performed.  I discussed with her and her daughter that seizure is a possibility though postictal stage are rarely 3 days.  Ms. Aune would prefer not to do an EEG at this time.  I did discuss the necessity of doing this test if another similar episode occurs.  We also spent some time discussing the 2 probable small meningiomas.  They have been present on CT scans as far back as 2014 with no significant change in size.  No intervention is necessary.  We discussed that if they do begin to grow slow progressive worsening of neurologic symptoms might occur and we would want to check a repeat imaging study if she has no symptoms.  She will return to see me as needed if she has new or worsening neurologic symptoms or if another episode of encephalopathy or altered mental status occurs.  Thank you for  asking me to see Ms. Sherryl Barters.  Please let me know if I can be of further assistance with her or other patients in the future.   Terre Zabriskie A. Felecia Shelling, MD, [facility] [id number], [id number] AM Certified in Neurology, Clinical Neurophysiology, Sleep Medicine and Neuroimaging  [facility] [address], [place], [place] [address] [phone]

## 2020-03-07 ENCOUNTER — Other Ambulatory Visit: Payer: Self-pay | Admitting: *Deleted

## 2020-03-07 NOTE — Patient Outreach (Signed)
[facility]) Care Management  03/07/2020  Nancy Castro 1951-02-21 [id number]  Unsuccessful outreach attempt made to patient. RN Health Coach left HIPAA compliant voicemail message along with her contact information.  Plan: RN Health Coach will call patient within the month of May   Emelia Loron RN, [place] [phone] [email or web address]

## 2020-03-11 ENCOUNTER — Encounter: Payer: Self-pay | Admitting: Medical

## 2020-03-11 ENCOUNTER — Other Ambulatory Visit: Payer: Self-pay | Admitting: *Deleted

## 2020-03-11 ENCOUNTER — Ambulatory Visit: Payer: Self-pay | Admitting: *Deleted

## 2020-03-11 NOTE — Patient Outreach (Signed)
[facility]) Care Management  03/11/2020  BRALYNN EUGENE [birth year] [id number]   CSW attempted to reach pt/daughter by phone today and was able to leave HIPPA compliant voice message for callback.  CSW will attempt to call pt again in 3-4 business days if no return call is received.   Eduard Clos, MSW, [place] Worker  [place] [phone]

## 2020-03-14 ENCOUNTER — Ambulatory Visit: Payer: Self-pay | Admitting: *Deleted

## 2020-03-15 ENCOUNTER — Other Ambulatory Visit: Payer: Self-pay | Admitting: *Deleted

## 2020-03-15 NOTE — Patient Outreach (Addendum)
[place] [facility]) Care Management  03/15/2020  ERIA KALKWARF 25-Apr-1951 [id number]   CSW attempted to reach pt/daughter yesterday for follow up and was unable to reach pt. CSW left a HIPPA compliant voice message and will await callback. CSW will plan to attempt outreach again in 3-4 business days if no return call is received.   Eduard Clos, MSW, [place] Worker  [place] [phone]

## 2020-03-18 ENCOUNTER — Other Ambulatory Visit: Payer: Self-pay | Admitting: *Deleted

## 2020-03-18 NOTE — Patient Outreach (Addendum)
[facility]) Care Management  [place]  03/18/2020   Nancy Castro 01/30/1951 [id number]   Objective: Successful telephone outreach call to patient. HIPAA identifiers obtained. Patient reports that she is doing well. She went to the dentist 03/07/20 and has an upcoming appointment to receive dental fillings. Patient reports that she went to get her eyes examined and does have a new prescription to get eyeglasses. She did receive her B/P cuff and Ensure coupons in the mail and has started to take her B/P daily and is recording values. B/P today is 132/80 and adds the Ensure taste good and is helping her to maintain her weight. Patient is motivated to stay busy. This has helped with increasing her physical activity as well as uplifting her emotionally now that places are opening up as the pandemic is improving. She continues to participate with her church online but is thinking about returning physically to church soon as she explains she has a good church family and returning would be good for her socially. She is interested in becoming active at the [facility] explaining that it is close to her house and would be a good way to increase her exercise and to make some social contacts. Nurse did provide patient with [facility] and other social senior activity resources within the community. Patient states she has gained her strength back after being discharged from the hospital and she has been enjoying doing her yard work and gardening. She continues to walk weekly and is eating a low sodium diet. Patient states she along with her daughter's assistance have accomplished a lot in a short period of time and she feels like she is in a good place at this point in time.   Encounter Medications:  Outpatient Encounter Medications as of 03/18/2020  Medication Sig Note  . atorvastatin (LIPITOR) 40 MG tablet Take 1 tablet (40 mg total) by mouth daily.   . cholecalciferol  (VITAMIN D3) 25 MCG (1000 UNIT) tablet Take 1 tablet (1,000 Units total) by mouth daily.   Marland Kitchen losartan (COZAAR) 50 MG tablet Take 1 tablet (50 mg total) by mouth daily.   . MULTIPLE VITAMIN PO Take 1 tablet by mouth daily.  01/07/2020: Pt daughter verified medication.  . niacin 250 MG tablet Take 2 tablets (500 mg total) by mouth at bedtime. 2 tablets OTC daily    No facility-administered encounter medications on file as of 03/18/2020.    Functional Status:  In your present state of health, do you have any difficulty performing the following activities: 02/21/2020 02/20/2020  Hearing? N N  Vision? N N  Difficulty concentrating or making decisions? N Y  Comment - has had episoeds of mild cognitive impairment, daughter and patient states, she is independent with ADLs and IADLs  Walking or climbing stairs? Y N  Dressing or bathing? N N  Doing errands, shopping? N N  Preparing Food and eating ? N N  Using the Toilet? N N  In the past six months, have you accidently leaked urine? N N  Do you have problems with loss of bowel control? N N  Managing your Medications? N N  Managing your Finances? N N  Housekeeping or managing your Housekeeping? N N  Some recent data might be hidden    Fall/Depression Screening: Fall Risk  03/18/2020 02/21/2020 02/20/2020  Falls in the past year? 0 0 0  Number falls in past yr: 0 - 0  Injury with Fall? 0 - 0  Follow up Falls prevention discussed;Education provided;Falls evaluation completed - Falls prevention discussed;Education provided;Falls evaluation completed   PHQ 2/9 Scores 02/21/2020 02/20/2020 12/13/2018 08/25/2017 02/05/2016 08/07/2015  PHQ - 2 Score 0 2 0 0 0 0  PHQ- 9 Score 0 6 - - - -   [facility] CM Care Plan Problem One     Most Recent Value  Care Plan Problem One  Knowledge deficient related to hypertension condition, treatment and lifestyle changes as evidenced by request for information  Role Documenting the Problem One  [place] for  Problem One  Active  [facility] Long Term Goal   Patient will maintain B/P within normal range of 130/80 within the next 90 days  [facility] Long Term Goal Start Date  03/18/20  [facility] Long Term Goal Met Date  03/18/20  Interventions for Problem One Long Term Goal  Patient reports that she has received her B/P cuff and understands to take her B/P daily and record, she is motivated to increase her physical activity and is eating a low sodium diet, patient is working towards maintaining her B/P within normal ranges  [facility] CM Short Term Goal #1   Patient will increase physical activity by continuing to walk, do yard work, and reach out to the [employer] to enroll in classes within the next 30 days.  [facility] CM Short Term Goal #1 Start Date  03/18/20  [facility] CM Short Term Goal #1 Met Date  03/18/20  Interventions for Short Term Goal #1  Patient states she wants to stay physically active as much as possible. Patient does walk weekly and she maintains her yard, nurse did provide resource to [facility] which patient states she would like to start to go to increase her physical activity and to start to develop social contacts.     Plan: RN Health Coach will call patient within the month of June and patient agrees to future outreach calls.   Emelia Loron RN, BSN [place] [phone] [email or web address]

## 2020-03-20 ENCOUNTER — Other Ambulatory Visit: Payer: Self-pay | Admitting: *Deleted

## 2020-03-20 NOTE — Patient Outreach (Signed)
[place] [facility]) Care Management  03/20/2020  Nancy Castro [birth year] [id number]   CSW made contact with pt's daughter who has been staying with her and plans to return to her home at the end of July.  Per daughter, she is wanting her mom to be more independent, proof of her ability to do her ADL's, shopping, driving, etc as well as to be able to connect socially with more people.  Per daughter, they were able to get her appointments all taken care of.   CSW encouraged daughter to suggest to her mom that she run some errands, drive her car (daughter wants to make sure it is working well before she leaves town, become more active with her church, senior center, etc now that [place] restrictions have lifted some.  "I want her to reconnect and do more for herself".  Discussed with daughter how this is a role reversal in many ways and encouraged her to do some "mock trial runs" as if she is not her to make her feel better about her mom being alone.  Daughter plans to go home for about 5 days for some work and then return- late June- this will also allow her to see how pt will do alone.   CSW attempted to reach out to pt by phone as well and left a voicemail for return call. CSW will await her callback or try again in 3-4 business days.   Eduard Clos, MSW, [place] Worker  [place] [phone]

## 2020-03-25 ENCOUNTER — Other Ambulatory Visit: Payer: Self-pay | Admitting: *Deleted

## 2020-03-25 ENCOUNTER — Ambulatory Visit: Payer: Self-pay | Admitting: *Deleted

## 2020-03-25 NOTE — Patient Outreach (Signed)
[place] [facility]) Care Management  03/25/2020  Nancy Castro 16-Sep-1951 [id number]  Successful telephone outreach call to patient. HIPAA identifiers obtained. Patient called nurse to inform her that the B/P cuff that [facility] sent her is not working. Patient explains that the tubing has an air leak that you can hear and will not pump up the cuff. The monitor will only record an error reading. Nurse apologize to the patient and states she will have another cuff sent to her as a replacement. Patient was very Patent attorney.   Plan: RN Health Coach will call patient within the month of June and patient agrees to future outreach calls.   Emelia Loron RN, BSN [place] [phone] [email or web address]

## 2020-03-26 ENCOUNTER — Other Ambulatory Visit: Payer: Self-pay | Admitting: *Deleted

## 2020-03-26 NOTE — Patient Outreach (Signed)
[place] [facility]) Care Management  03/26/2020  Nancy Castro [birth year] [id number]   CSW attempted to reach pt by phone today and no answer/no voicemail.  CSW will attempt outreach again in 3-4 business days if no return call received.    Eduard Clos, MSW, [place] Worker  [place] [phone]

## 2020-03-28 ENCOUNTER — Ambulatory Visit: Payer: Self-pay | Admitting: *Deleted

## 2020-03-29 ENCOUNTER — Other Ambulatory Visit: Payer: Self-pay | Admitting: *Deleted

## 2020-03-29 NOTE — Patient Outreach (Signed)
[place] [facility]) Care Management  03/29/2020  DAGMAR HOUG 10-16-1951 [id number]   CSW attempted outreach to pt and was unsuccessful. CSW has spoken with pt's daughter (last week) and is attempting to reach pt to have conversation with her about her level of planning/comfort and needs after her daughter leaves (end of July).  CSW will attempt to reach pt again in 3-4 business days.   Eduard Clos, MSW, [place] Worker  [place] [phone]

## 2020-04-04 ENCOUNTER — Ambulatory Visit: Payer: Self-pay | Admitting: *Deleted

## 2020-04-05 ENCOUNTER — Other Ambulatory Visit: Payer: Self-pay | Admitting: *Deleted

## 2020-04-05 NOTE — Patient Outreach (Signed)
[place] [facility]) Care Management  04/05/2020  TAUNI SANKS 03/06/1951 [id number]   Late Entry  CSW attempted to reach pt (no answer/no voicemail) and pt's daughter(voicemail left).  CSW will send unsuccessful outreach letter and attempt a final outreach call in 3-4 business days if no return call is received.   Eduard Clos, MSW, [place] Worker  [place] [phone]

## 2020-04-09 ENCOUNTER — Other Ambulatory Visit: Payer: Self-pay | Admitting: *Deleted

## 2020-04-09 NOTE — Patient Outreach (Signed)
[place] [facility]) Care Management  [place]  04/09/2020   Nancy Castro [birth year] [id number]  Subjective: Successful telephone outreach call to patient. HIPAA identifiers obtained. Patient states she is doing very well. She did confirm that she received the B/P cuff that was sent to her and that it is working appropriately. Her B/P is currently under control with B/P ranges of 100/73-119/67. Patient is eating a low sodium diet and is staying physically active. Her goal is to increase her social activity and states she plans to call the [facility] to get involved with their activities.    Encounter Medications:  Outpatient Encounter Medications as of 04/09/2020  Medication Sig Note  . atorvastatin (LIPITOR) 40 MG tablet Take 1 tablet (40 mg total) by mouth daily.   . cholecalciferol (VITAMIN D3) 25 MCG (1000 UNIT) tablet Take 1 tablet (1,000 Units total) by mouth daily.   Marland Kitchen losartan (COZAAR) 50 MG tablet Take 1 tablet (50 mg total) by mouth daily.   . MULTIPLE VITAMIN PO Take 1 tablet by mouth daily.  01/07/2020: Pt daughter verified medication.  . niacin 250 MG tablet Take 2 tablets (500 mg total) by mouth at bedtime. 2 tablets OTC daily    No facility-administered encounter medications on file as of 04/09/2020.    Functional Status:  In your present state of health, do you have any difficulty performing the following activities: 02/21/2020 02/20/2020  Hearing? N N  Vision? N N  Difficulty concentrating or making decisions? N Y  Comment - has had episoeds of mild cognitive impairment, daughter and patient states, she is independent with ADLs and IADLs  Walking or climbing stairs? Y N  Dressing or bathing? N N  Doing errands, shopping? N N  Preparing Food and eating ? N N  Using the Toilet? N N  In the past six months, have you accidently leaked urine? N N  Do you have problems with loss of bowel control? N N  Managing your Medications? N N  Managing your Finances?  N N  Housekeeping or managing your Housekeeping? N N  Some recent data might be hidden    Fall/Depression Screening: Fall Risk  03/18/2020 02/21/2020 02/20/2020  Falls in the past year? 0 0 0  Number falls in past yr: 0 - 0  Injury with Fall? 0 - 0  Follow up Falls prevention discussed;Education provided;Falls evaluation completed - Falls prevention discussed;Education provided;Falls evaluation completed   PHQ 2/9 Scores 02/21/2020 02/20/2020 12/13/2018 08/25/2017 02/05/2016 08/07/2015  PHQ - 2 Score 0 2 0 0 0 0  PHQ- 9 Score 0 6 - - - -   [facility] CM Care Plan Problem One     Most Recent Value  Care Plan Problem One  Knowledge deficient related to hypertension condition, treatment and lifestyle changes as evidenced by request for information  Role Documenting the Problem One  [place] for Problem One  Active  [facility] Long Term Goal   Patient will maintain B/P within normal range of 130/80 within the next 90 days  [facility] Long Term Goal Start Date  04/09/20  [facility] Long Term Goal Met Date  04/09/20  Interventions for Problem One Long Term Goal  Encouraged patient to continue to take her B/P daily and record, continue low sodium diet and medication adherence, encouraged patient to continue to walk and do yard work, praised patient for keeping her B/P under control  [facility] CM Short Term Goal #1   Patient  will increase her social activity within the next 30 days  THN CM Short Term Goal #1 Start Date  03/18/20  Interventions for Short Term Goal #1  Patient confirmed she did receive [facility] and [facility] resources, nurse encouraged patient to reach out to the [facility] or [facility], encouraged patient to continue to go to church and get involve with the social activities they may offfer.      Plan: RN Health Coach will call patient within the month of July, will send patient Ensure coupons, and patient agrees to future outreach calls.   Emelia Loron RN, BSN [place] [phone] [email or web address]

## 2020-04-10 NOTE — Patient Outreach (Signed)
[place] [facility]) Care Management  04/10/2020  Nancy Castro 1950-12-02 [id number]   CSW spoke with pt by phone today and confirmed her identity.  "My daughter is over at her fathers right now".  Pt shared with CSW that she is doing well; even did some (push) mowing in her yard today.  Pt was pleasant, engaging and very knowledgeable.  Pt has been able to be seen for dental and eye exams, mammogram, bone density and has received her COVID vaccinations.  Pt also reports using her "$40 each quarter"  Benefit for the OTC catalog through her insurance. "I am checking my blood pressure every day and its been in the normal range".  Pt appreciative of [facility] RNCM, Emelia Loron, sending her coupons for Ensure and for the calls.  Pt shared with CSW that she has enjoyed her daughter staying with her these past few months.  Per pt, her daughter will be gone for a few days and she is comfortable being alone.  Pt reports she can drive to get her groceries, meds,etc and would like to become more active now that COVID restrictions have been lifted.  "I'm gonna be lonely when my daughter leaves in August".  She is interested in seeking information on the [employer].  CSW will mail pt some upcoming schedules/calendars of their activities.    Pt reminded to call if additional needs arise- CSW will plan to contact pt again next week at scheduled time (pt request) 6/17 at [place], MSW, [place] Worker  [place] [phone]

## 2020-04-18 ENCOUNTER — Other Ambulatory Visit: Payer: Self-pay | Admitting: *Deleted

## 2020-04-19 NOTE — Patient Outreach (Signed)
[place] [facility]) Care Management  04/19/2020  Nancy Castro 1951-09-22 [id number]  CSW attempted outreach to pt and left a voicemail to which pt returned call (voicemail left).  CSW attempted to call pt back but was unable to reach and left another voice mail message.  CSW will attempt again.    Eduard Clos, MSW, [place] Worker  [place] [phone]

## 2020-04-24 ENCOUNTER — Other Ambulatory Visit: Payer: Self-pay | Admitting: *Deleted

## 2020-04-24 NOTE — Patient Outreach (Signed)
[place] [facility]) Care Management  04/24/2020  Nancy Castro [birth year] [id number]   CSW left a HIPPAompliant voice message for pt and was able to make contact with her daughter.  Daughter reports her mom is doing better and is alone while daughter is out of state.  "I think she's likely doing better than I am with this". Daughter feels her mom has improved partially because of the weather;  "she felt more isolated during the winter months because she couldn't go outside and do yard work".  Per daughter, pt mows and is typically outside every day if weather permits to "snip hedges or something". The enjoyment she gets from the yard/work, the lessening of the pandemic rules and pt's daughter spending time with her the last few months have helped.  Pt's daughter also shared that her mom "looks forward to her talks with Sharee Pimple and you".   CSW updated daughter on last chat I had with pt- she is eager to get back into the senior center/activities as she can.  CSW reminded daughter to call if needs or questions arise before next CSW call.   Eduard Clos, MSW, [place] Worker  [place] [phone]

## 2020-05-02 ENCOUNTER — Other Ambulatory Visit: Payer: Self-pay | Admitting: Medical

## 2020-05-02 DIAGNOSIS — I10 Essential (primary) hypertension: Secondary | ICD-10-CM

## 2020-05-08 ENCOUNTER — Other Ambulatory Visit: Payer: Self-pay | Admitting: *Deleted

## 2020-05-08 NOTE — Patient Outreach (Signed)
[place] [facility]) Care Management  05/08/2020  Nancy Castro 27-May-1951 [id number]   CSW spoke with pt as well as daughter (separately) today. Both pt and her daughter report she is doing better; going to the [facility] and participating in exercise programs.  Pt reports the arthritis focused exercise programs have been a great opportunity to be active and social in the community setting.  Pt's daughter reports her mother is in "better spirits" and both pt and daughter are feeling more comfortable with pt being alone when daughter returns to her home/work in [place].  Pt's daughter feels the [facility] will be a great avenue for her now as well as into the fall/winter months when its not ideal weather for being outdoors.    Pt's daughter inquired about getting pt a DMV Handicap placard.  CSW advised daughter on steps to completion and receiving the actual placard from [place].  Pt's daughter shared that her mother went to [facility] office and got a form- another good sign of pt's abilities.   CSW offered support and encouragement and will plan to touch base later in month before daughter leaves [place].   Eduard Clos, MSW, [place] Worker  [place] [phone]

## 2020-05-09 ENCOUNTER — Telehealth: Payer: Self-pay | Admitting: Medical

## 2020-05-09 ENCOUNTER — Other Ambulatory Visit: Payer: Self-pay | Admitting: *Deleted

## 2020-05-09 NOTE — Telephone Encounter (Signed)
OK got it filled out and form placed in your folder

## 2020-05-09 NOTE — Telephone Encounter (Signed)
That is fine, completely demographic information and everything that can be completed prior to me adding my comments, then get me the form

## 2020-05-09 NOTE — Telephone Encounter (Signed)
Pt called and wants to see if she could get a form completed so she can get a handicap placard

## 2020-05-09 NOTE — Patient Outreach (Signed)
[place] [facility]) Care Management  [place]  05/09/2020   Nancy Castro 21-Mar-1951 [id number]  Subjective: Successful telephone outreach call to patient. HIPAA identifiers obtained. Patient states she is feeling well. Per patient she started to attend senior exercise classes at the [facility] of which she is thoroughly enjoying. Commenting that all of the participants are laughing and joking with one another and that makes her want to continue to go for the exercise and socialization. Patient's B/P is well controlled at this time. Her B/P was 110/66 this morning and patient states her highest reading lately has been 131/85. Patient continues to read food labels for sodium content and states she is maintaining a heart healthy diet. Patient states that her appetite has improved and she continues to supplement with Ensure as needed.   Encounter Medications:  Outpatient Encounter Medications as of 05/09/2020  Medication Sig Note  . atorvastatin (LIPITOR) 40 MG tablet Take 1 tablet (40 mg total) by mouth daily.   . cholecalciferol (VITAMIN D3) 25 MCG (1000 UNIT) tablet Take 1 tablet (1,000 Units total) by mouth daily.   Marland Kitchen losartan (COZAAR) 50 MG tablet Take 1 tablet (50 mg total) by mouth daily.   . MULTIPLE VITAMIN PO Take 1 tablet by mouth daily.  01/07/2020: Pt daughter verified medication.  . niacin 250 MG tablet Take 2 tablets (500 mg total) by mouth at bedtime. 2 tablets OTC daily    No facility-administered encounter medications on file as of 05/09/2020.    Functional Status:  In your present state of health, do you have any difficulty performing the following activities: 02/21/2020 02/20/2020  Hearing? N N  Vision? N N  Difficulty concentrating or making decisions? N Y  Comment - has had episoeds of mild cognitive impairment, daughter and patient states, she is independent with ADLs and IADLs  Walking or climbing stairs? Y N  Dressing or bathing? N N  Doing errands,  shopping? N N  Preparing Food and eating ? N N  Using the Toilet? N N  In the past six months, have you accidently leaked urine? N N  Do you have problems with loss of bowel control? N N  Managing your Medications? N N  Managing your Finances? N N  Housekeeping or managing your Housekeeping? N N  Some recent data might be hidden    Fall/Depression Screening: Fall Risk  05/09/2020 03/18/2020 02/21/2020  Falls in the past year? 0 0 0  Number falls in past yr: 0 0 -  Injury with Fall? 0 0 -  Follow up Falls prevention discussed;Falls evaluation completed Falls prevention discussed;Education provided;Falls evaluation completed -   PHQ 2/9 Scores 02/21/2020 02/20/2020 12/13/2018 08/25/2017 02/05/2016 08/07/2015  PHQ - 2 Score 0 2 0 0 0 0  PHQ- 9 Score 0 6 - - - -   Goals Addressed            This Visit's Progress   . Patient will maintain B/P below 140/85 within the next 90 days       [place] (see longtitudinal plan of care for additional care plan information)  Objective:  . Last practice recorded BP readings:  BP Readings from Last 3 Encounters:  03/05/20 122/73  02/21/20 138/80  01/17/20 130/80 .   Marland Kitchen Most recent eGFR/CrCl: No results found for: EGFR  No components found for: CRCL  Current Barriers:  Marland Kitchen Knowledge deficit related to self care management of hypertension  Case Manager Clinical Goal(s):  .  Over the next 90 days, patient will verbalize understanding of plan for hypertension management  Interventions:  . Evaluation of current treatment plan related to hypertension self management and patient's adherence to plan as established by provider. . Discussed plans with patient for ongoing care management follow up and provided patient with direct contact information for care management team  Patient Self Care Activities:  . Self administers medications as prescribed . Monitors BP and records as discussed . Adheres to a low sodium diet/DASH diet . Increase physical  activity as tolerated          Plan: RN Health Coach will call patient within the month of August, will send patient Ensure coupons, will send PCP today's assessment note, and patient agrees to future outreach calls.   Emelia Loron RN, BSN [place] [phone] Nancy [email or web address]

## 2020-05-28 ENCOUNTER — Other Ambulatory Visit: Payer: Self-pay | Admitting: *Deleted

## 2020-05-28 NOTE — Patient Outreach (Signed)
[place] [facility] Care Management  05/28/2020  KENIAH KLEMMER 11/07/1950 [id number]   CSW made contact with pt's daughter who reports her mom is continuing to do better and she plans to return home to [place] later this week.  "I have been staying at my fathers house just to give her some space".  Pt was able to go to the [facility] and get paperwork for MD to complete for handicap placard and has completed the task.  Daughter also pleased with her mother's ability to handle this as well as pt's activity, driving, etc. Per daughter, pt has been going to the [place] a few days a week for social and exercise.    CSW offered to touch base again in a few weeks to see how pt is adapting.  Pt's daughter is much appreciative of CSW and assistance/support provided.   Eduard Clos, MSW, [place] Worker  [place] [phone]

## 2020-06-02 IMAGING — CT CT HEAD W/O CM
3 of 4 series · 14 of 47 positions shown, 16 images · non-contrast
Comparison: 03/24/2011 from [HOSPITAL]

CLINICAL DATA: Altered mental status.

EXAM:
CT HEAD WITHOUT CONTRAST
TECHNIQUE: Contiguous axial images were obtained from the base of the skull
through the vertex without intravenous contrast.

[Series 4: coronal soft tissue · coronal · 0.30mm/px · 3 of 69 slices shown]
[im 23/69  brain]
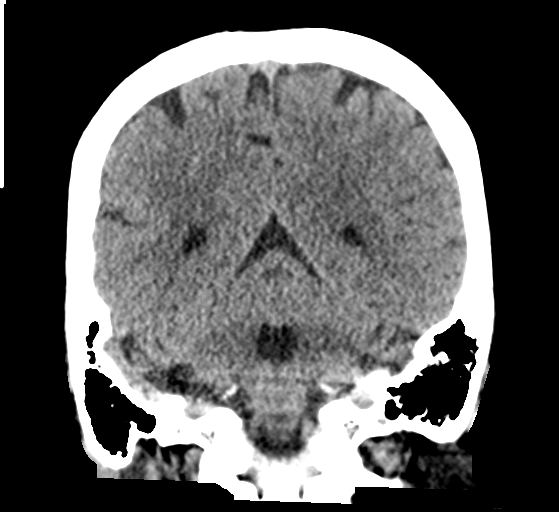
[im 31/69  brain]
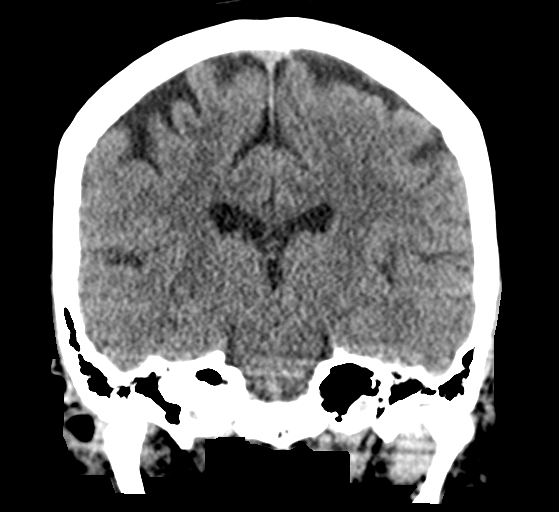
[im 38/69  brain]
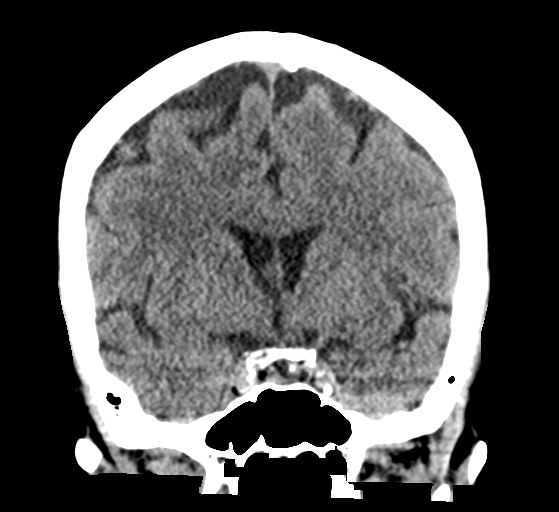

[Series 5: sagittal soft tissue · sagittal · 0.30mm/px · 3 of 62 slices shown]
[im 21/62  brain]
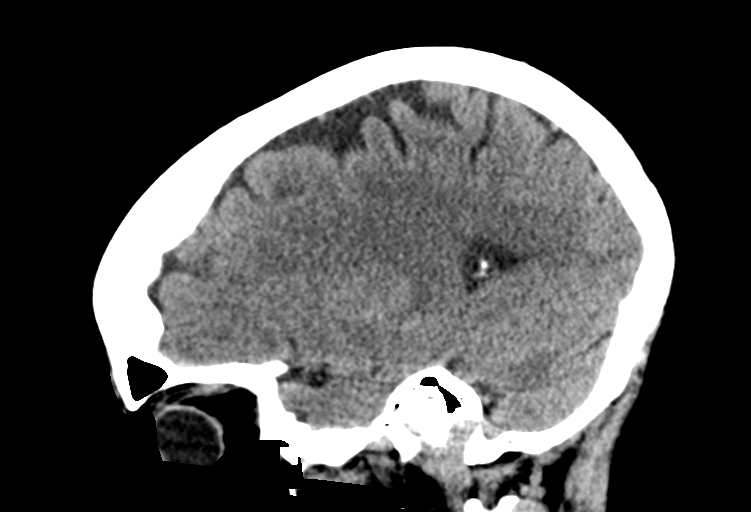
[im 31/62  brain]
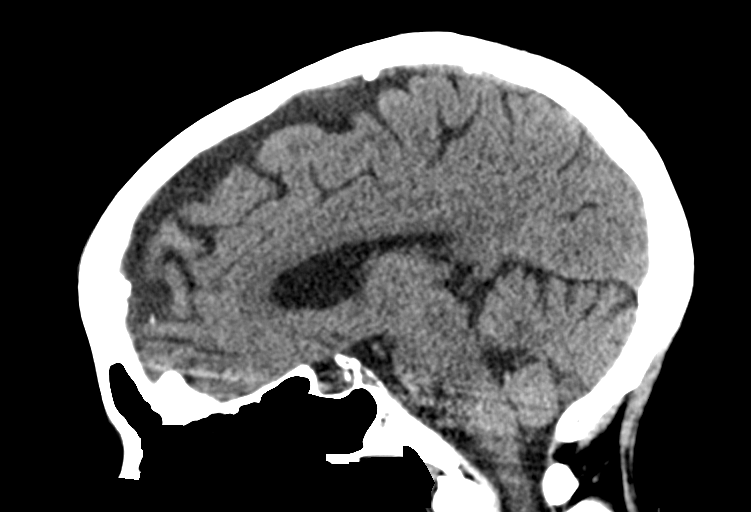
[im 41/62  brain]
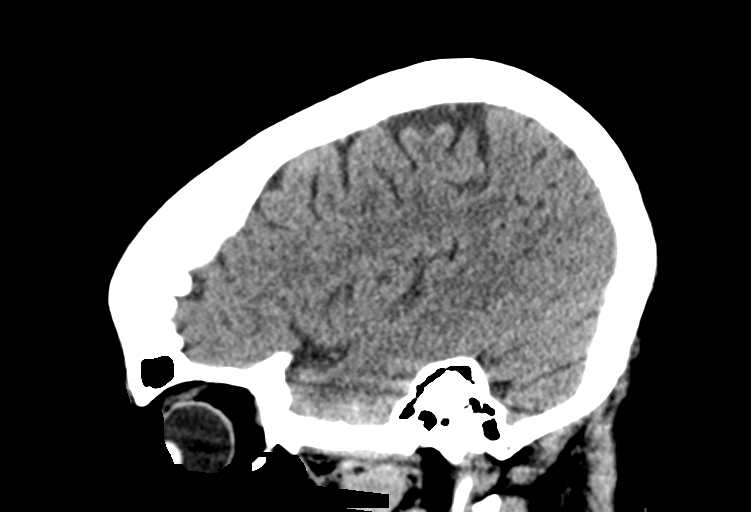

[Series 7: axial soft tissue · axial · 0.31mm/px · z∈[-173,-54]mm · 8 of 52 slices shown, 10 images]
[im 6/52  brain]
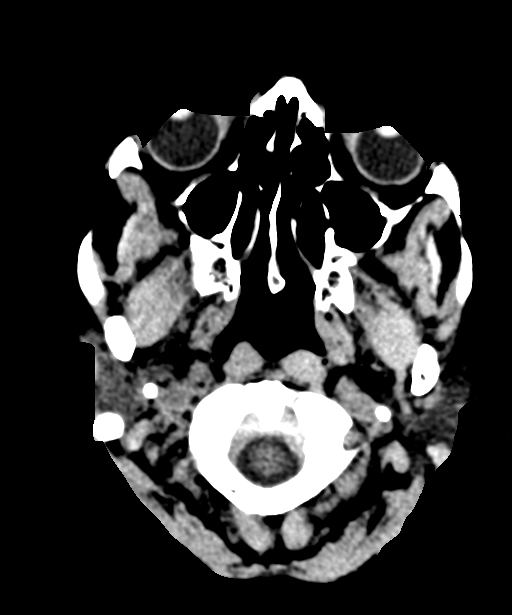
[im 6/52  bone]
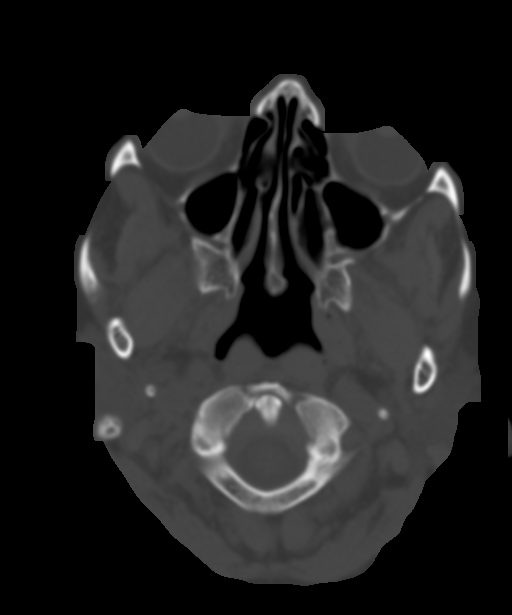
[im 12/52  brain]
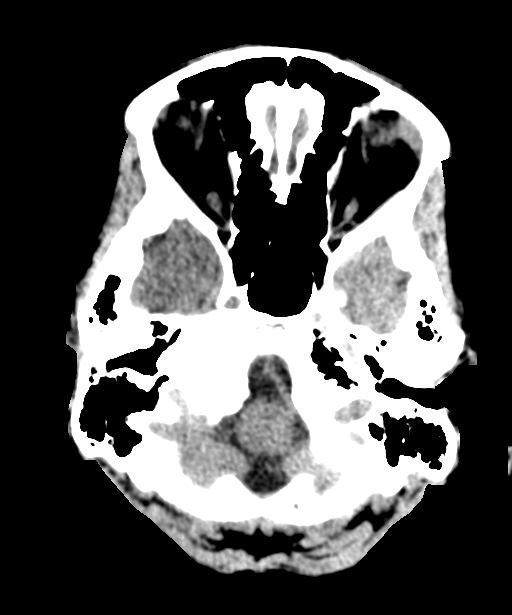
[im 18/52  brain]
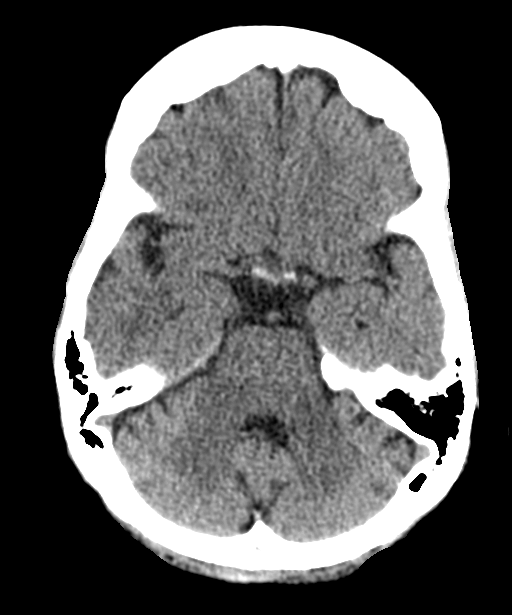
[im 23/52  brain]
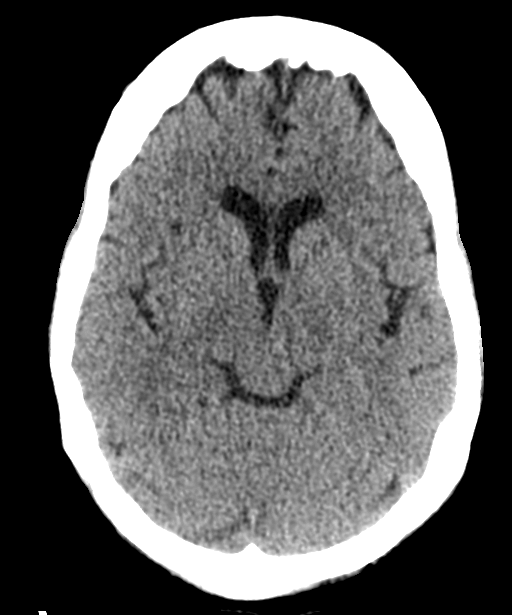
[im 29/52  brain]
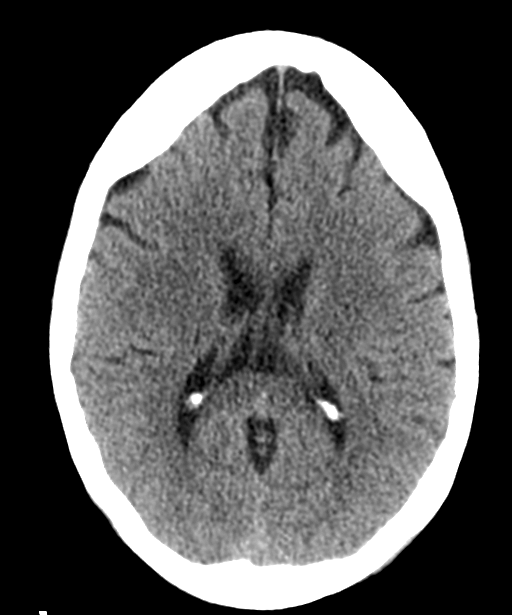
[im 29/52  bone]
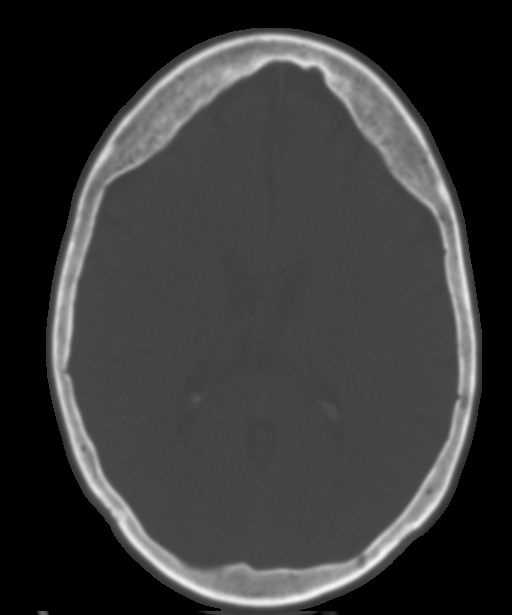
[im 35/52  brain]
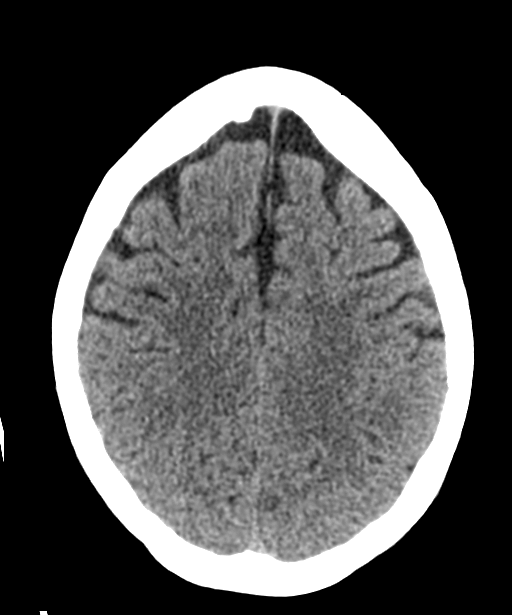
[im 40/52  brain]
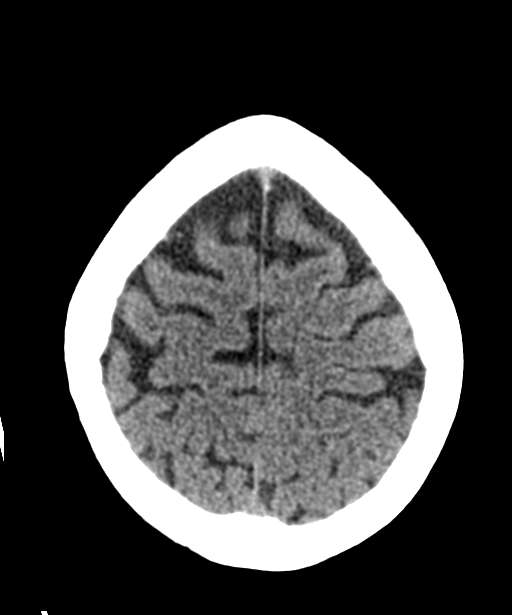
[im 46/52  brain]
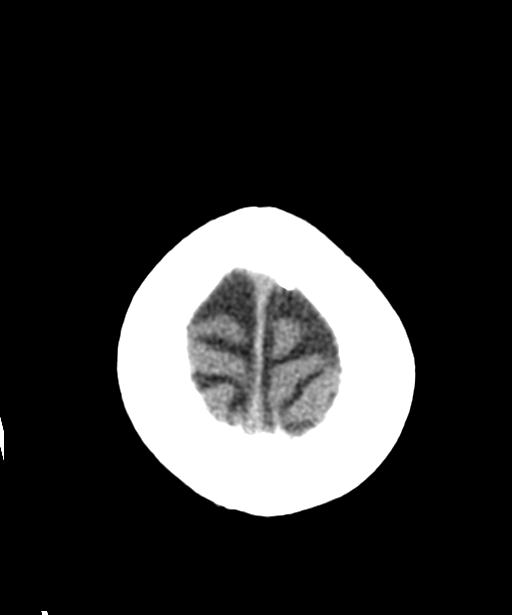

[14 of 47 positions shown; findings below may reference images not displayed]

FINDINGS: Brain: No evidence of acute infarction, hemorrhage, hydrocephalus,
extra-axial collection, or intra-axial mass lesion/mass effect.

Vascular:  No hyperdense vessel or other acute findings.

Skull: No evidence of fracture. A small extra-axial well ossified
mass is seen in the right frontal region measuring 7 mm in
thickness, without significant change since previous study,
consistent with benign meningioma or osteoma.

Sinuses/Orbits:  No acute findings.

Other: None.
IMPRESSION: No acute intracranial findings.

Stable sub-cm ossified extra-axial mass in the right frontal vertex,
consistent with meningioma or osteoma.

## 2020-06-06 ENCOUNTER — Ambulatory Visit: Payer: Self-pay | Admitting: *Deleted

## 2020-06-10 ENCOUNTER — Telehealth: Payer: Self-pay

## 2020-06-10 ENCOUNTER — Other Ambulatory Visit: Payer: Self-pay | Admitting: *Deleted

## 2020-06-10 NOTE — Patient Outreach (Signed)
[facility]) Care Management  [place]  06/10/2020   Nancy Castro 11/27/1950 [id number]  Subjective: Successful telephone outreach call to patient. HIPAA identifiers obtained. Patient states she is dong well. She reports that her daughter went back to [place] on the 8/31 and that she feels good about being at home by herself. Patient explains that she is keeping busy with going to the [facility] to exercise and doing her gardening and house-work. She continues to speak with her daughter daily over the phone. 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  Patient states that her hypertension is well controlled at this time. She continues to follow the dash diet and stays physically active. Her B/P today was 117/79 and her highest recently has been 132/82. Patient states she is doing well emotionally and does not have any other questions or concerns at this time.   Encounter Medications:  Outpatient Encounter Medications as of 06/10/2020  Medication Sig Note  . atorvastatin (LIPITOR) 40 MG tablet Take 1 tablet (40 mg total) by mouth daily.   . cholecalciferol (VITAMIN D3) 25 MCG (1000 UNIT) tablet Take 1 tablet (1,000 Units total) by mouth daily.   Marland Kitchen losartan (COZAAR) 50 MG tablet Take 1 tablet (50 mg total) by mouth daily.   . MULTIPLE VITAMIN PO Take 1 tablet by mouth daily.  01/07/2020: Pt daughter verified medication.  . niacin 250 MG tablet Take 2 tablets (500 mg total) by mouth at bedtime. 2 tablets OTC daily    No facility-administered encounter medications on file as of 06/10/2020.    Functional Status:  In your present state of health, do you have any difficulty performing the following activities: 02/21/2020 02/20/2020  Hearing? N N  Vision? N N  Difficulty concentrating or making decisions? N Y  Comment - has had episoeds of mild cognitive impairment, daughter and patient states, she is  independent with ADLs and IADLs  Walking or climbing stairs? Y N  Dressing or bathing? N N  Doing errands, shopping? N N  Preparing Food and eating ? N N  Using the Toilet? N N  In the past six months, have you accidently leaked urine? N N  Do you have problems with loss of bowel control? N N  Managing your Medications? N N  Managing your Finances? N N  Housekeeping or managing your Housekeeping? N N  Some recent data might be hidden    Fall/Depression Screening: Fall Risk  06/10/2020 05/09/2020 03/18/2020  Falls in the past year? 0 0 0  Number falls in past yr: 0 0 0  Injury with Fall? 0 0 0  Follow up Falls prevention discussed;Education provided;Falls evaluation completed Falls prevention discussed;Falls evaluation completed Falls prevention discussed;Education provided;Falls evaluation completed   PHQ 2/9 Scores 02/21/2020 02/20/2020 12/13/2018 08/25/2017 02/05/2016 08/07/2015  PHQ - 2 Score 0 2 0 0 0 0  PHQ- 9 Score 0 6 - - - -   Goals Addressed            This Visit's Progress   . Patient will maintain B/P below 140/85 within the next 90 days       [place] (see longtitudinal plan of care for additional care plan information)  Objective:  . Last practice recorded BP readings:  BP Readings from Last 3 Encounters:  03/05/20 122/73  02/21/20 138/80  01/17/20 130/80 .   Marland Kitchen Most recent  eGFR/CrCl: No results found for: EGFR  No components found for: CRCL  Current Barriers:  Marland Kitchen Knowledge deficit related to self care management of hypertension  Case Manager Clinical Goal(s):  Marland Kitchen Over the next 90 days, patient will continue to take her B/P daily and record, follow a low sodium diet, stay physically active, and continue to go to the [facility].   Interventions:  . Evaluation of current treatment plan related to hypertension self management and patient's adherence to plan as established by provider. . Discussed plans with patient for ongoing care management follow up and  provided patient with direct contact information for care management team  Patient Self Care Activities:  . Self administers medications as prescribed . Monitors BP and records as discussed . Adheres to a low sodium diet/DASH diet . Increase physical activity as tolerated . Goes to the [facility] x2 weekly         Plan: [place] will send patient Ensure coupons, will call patient within the month of September, and patient agrees to future outreach calls.   Emelia Loron RN, BSN [place] [phone] [place] [email or web address]

## 2020-06-10 NOTE — Telephone Encounter (Signed)
Have her set up an appointment to come in

## 2020-06-10 NOTE — Telephone Encounter (Signed)
Pt. Called stating that she has been having some stiffness and pain in both hands and she wondered if it was some of her medications causing her to have the symptoms. She take Atorvastatin and Aspirin and both of those cause muscle cramping and stiffness.

## 2020-06-13 NOTE — Telephone Encounter (Signed)
I called pt. And she is seeing Audelia Acton next Thursday.

## 2020-06-17 ENCOUNTER — Other Ambulatory Visit: Payer: Self-pay | Admitting: *Deleted

## 2020-06-18 NOTE — Patient Outreach (Signed)
[facility]) Care Management  06/18/2020  [name] Nancy Castro, Nancy Castro [id number]   CSW spoke with pt who reports she has been doing well- "working in my yard has kept me busy".  She also has been enjoying the classes at the [facility] and denies any concerns at this time.  Pt seems to have adjusted well to her daughter's departure and being alone.  She is thankful to [place], RN, [place], for [employer] coupons.   CSW talked with pt about her plans as the COVID restrictions begin to be re-implemented. CSW encouraged pt to think about things she can plan to do when/if things are limited more in the coming months.    CSW offered support and encouragement and will plan to touch base again in the next few weeks.   Eduard Clos, MSW, [place] Worker  [place] [phone]

## 2020-06-20 ENCOUNTER — Ambulatory Visit (INDEPENDENT_AMBULATORY_CARE_PROVIDER_SITE_OTHER): Payer: Medicare Other | Admitting: Medical

## 2020-06-20 ENCOUNTER — Other Ambulatory Visit: Payer: Self-pay

## 2020-06-20 ENCOUNTER — Encounter: Payer: Self-pay | Admitting: Medical

## 2020-06-20 VITALS — BP 120/68 | HR 67 | Ht 61.0 in | Wt 138.2 lb

## 2020-06-20 DIAGNOSIS — Z7185 Encounter for immunization safety counseling: Secondary | ICD-10-CM

## 2020-06-20 DIAGNOSIS — M659 Synovitis and tenosynovitis, unspecified: Secondary | ICD-10-CM | POA: Insufficient documentation

## 2020-06-20 DIAGNOSIS — M65931 Unspecified synovitis and tenosynovitis, right forearm: Secondary | ICD-10-CM | POA: Insufficient documentation

## 2020-06-20 DIAGNOSIS — Z7189 Other specified counseling: Secondary | ICD-10-CM

## 2020-06-20 DIAGNOSIS — M19042 Primary osteoarthritis, left hand: Secondary | ICD-10-CM

## 2020-06-20 DIAGNOSIS — Z23 Encounter for immunization: Secondary | ICD-10-CM

## 2020-06-20 DIAGNOSIS — M19041 Primary osteoarthritis, right hand: Secondary | ICD-10-CM | POA: Diagnosis not present

## 2020-06-20 NOTE — Progress Notes (Signed)
Subjective: Chief Complaint  Patient presents with  . Hand Problem    stiffness in right hand    Here for for stiffness and pain in right hand.  She notes pains in right knuckles in thumb and 2nd and 3rd fingers.  Also has burning sensation in top of right hand.   Been having this for a month.   Worse at bedtime.  Does feel puffy in hand at times.  sometimes can't completely close the hands.  Sometimes gets some swollen and pain in wrist along forearm.   No injury, no fall.  Using nothing for this.  No other stiffness, mainly right hand. No other aggravating or relieving factors. No other complaint.  Past Medical History:  Diagnosis Date  . DDD (degenerative disc disease), cervical   . H/O echocardiogram 2007  . History of exercise stress test 2007  . Hyperlipidemia   . Hypertension   . Mixed dyslipidemia   . Osteoarthritis   . Syncope 2012   cardiac stress test   . Wears glasses   . Wears partial dentures    upper and lower   ROS as in subjective   Objective: BP 120/68   Pulse 67   Ht 5\' 1"  (1.549 m)   Wt 138 lb 3.2 oz (62.7 kg)   SpO2 98%   BMI 26.11 kg/m   Gen: wd, wn, nad, African American female MSK: right hand with bony arthritis deformity of joints of thumb, MCPs 2 and 3, DIPs throughout no obvious swelling today in hand or wrist, mild tendnerss over right thumb joints, 2nd and 3rd MCPs, otherwise no deformity, normal ROM of hand, wrist, fingers.  Left hand with similar bony changes of thumb and 2nd and 3rd digits but a little less severe than right and nontender Arms and hands neurovascularly intact   Xray right hand 11/2017  FINDINGS: Advanced degenerative changes within the 1st carpometacarpal joint. Moderate degenerative changes in the IP joints, most pronounced in the 2nd DIP joint. Arthritic changes also in the 1st MCP joint and radiocarpal joint. No acute bony abnormality. Specifically, no fracture, subluxation, or dislocation.  IMPRESSION: Moderate to  advanced arthritic changes in the hand as above. No acute bony abnormality.   Assessment: Encounter Diagnoses  Name Primary?  . Primary osteoarthritis of both hands Yes  . Tenosynovitis of right wrist   . Need for pneumococcal vaccination   . Vaccine counseling      Plan: OA, tenosynovitis - discussed exam findings, etiology, treatment recommendations.  Reviewed right hand xray from 2019 showing arthritis.   Counseled on the pneumococcal vaccine.  Vaccine information sheet given.  Pneumococcal vaccine Prevnar 13 given after consent obtained.   Patient Instructions  Your hand and wrist exam shows osteoarthritis (bony arthritis changes in some of the knuckles).   You also have inflammation in the hand and wrist joints, called tenosynovitis.   Treatment includes:  Rest when flared up  You can continue your arthritis glove   You can use an over the counter reinforced wrist splint also known as carpal tunnel splint daily for 3-5 days at a time for wrist inflammation  Consider using a topical cream for pain such as Aspercreme or Capsaicin cream over the counter, or Diclofenac gel topically over the counter  If you prefer, I can write a prescription for a compounded cream through [facility] on [address].  You can also use Tylenol over the counter as needed for pain.   It comes in  325mg , 500mg  or the heavy duty 650mg  dose.  I would use this no more than twice daily as needed  Another option is Aspirin 81mg  tablet over the counter.  Some people also take this daily for heart disease prevention  You can't get rid of arthritis, and it can worsen over time  I do recommend regular exercise  Please call insurance to make sure they cover the following vaccines that you are due for:  Shingles  Tetanus  Get your high dose flu shot soon when they become available    Nancy Castro was seen today for hand problem.  Diagnoses and all orders for this visit:  Primary  osteoarthritis of both hands  Tenosynovitis of right wrist  Need for pneumococcal vaccination  Vaccine counseling

## 2020-06-20 NOTE — Patient Instructions (Addendum)
Your hand and wrist exam shows osteoarthritis (bony arthritis changes in some of the knuckles).   You also have inflammation in the hand and wrist joints, called tenosynovitis.   Treatment includes:  Rest when flared up  You can continue your arthritis glove   You can use an over the counter reinforced wrist splint also known as carpal tunnel splint daily for 3-5 days at a time for wrist inflammation  Consider using a topical cream for pain such as Aspercreme or Capsaicin cream over the counter, or Diclofenac gel topically over the counter  If you prefer, I can write a prescription for a compounded cream through [facility] on [address].  You can also use Tylenol over the counter as needed for pain.   It comes in 325mg , 500mg  or the heavy duty 650mg  dose.  I would use this no more than twice daily as needed  Another option is Aspirin 81mg  tablet over the counter.  Some people also take this daily for heart disease prevention  You can't get rid of arthritis, and it can worsen over time  I do recommend regular exercise  Please call insurance to make sure they cover the following vaccines that you are due for:  Shingles  Tetanus  Get your high dose flu shot soon when they become available

## 2020-07-05 ENCOUNTER — Ambulatory Visit: Payer: Self-pay | Admitting: *Deleted

## 2020-07-11 ENCOUNTER — Other Ambulatory Visit: Payer: Self-pay | Admitting: *Deleted

## 2020-07-11 NOTE — Patient Outreach (Addendum)
[facility]) Care Management  [place]  07/11/2020   JANUARY BERGTHOLD 07/27/1951 [id number]  Subjective: Successful telephone outreach call to patient. HIPAA identifiers obtained. Patient states she is doing very well. She reports that her daughter surprised her and came home to celebrate patient's 89 th birthday. Patient did visit her PCP 06/20/20 for right arthritic hand pain. She states she is applying topical over the counter pain ointment to help ease the pain. Going to the [employer] arthritic and tia [facility] exercise classes weekly also helps to prevent stiffness. Per patient her B/P remains well controlled and she continues to monitor her sodium intake. Patient reports that she did get her seasonal flu vaccination 07/09/20.  Patient states she is doing well emotionally and does not have any further questions or concerns at this time.   Encounter Medications:  Outpatient Encounter Medications as of 07/11/2020  Medication Sig Note  . atorvastatin (LIPITOR) 40 MG tablet Take 1 tablet (40 mg total) by mouth daily.   . cholecalciferol (VITAMIN D3) 25 MCG (1000 UNIT) tablet Take 1 tablet (1,000 Units total) by mouth daily.   Marland Kitchen losartan (COZAAR) 50 MG tablet Take 1 tablet (50 mg total) by mouth daily.   . MULTIPLE VITAMIN PO Take 1 tablet by mouth daily.  01/07/2020: Pt daughter verified medication.  . niacin 250 MG tablet Take 2 tablets (500 mg total) by mouth at bedtime. 2 tablets OTC daily    No facility-administered encounter medications on file as of 07/11/2020.    Functional Status:  In your present state of health, do you have any difficulty performing the following activities: 02/21/2020 02/20/2020  Hearing? N N  Vision? N N  Difficulty concentrating or making decisions? N Y  Comment - has had episoeds of mild cognitive impairment, daughter and patient states, she is independent with ADLs and IADLs  Walking or climbing stairs? Y N  Dressing or bathing? N N  Doing  errands, shopping? N N  Preparing Food and eating ? N N  Using the Toilet? N N  In the past six months, have you accidently leaked urine? N N  Do you have problems with loss of bowel control? N N  Managing your Medications? N N  Managing your Finances? N N  Housekeeping or managing your Housekeeping? N N  Some recent data might be hidden    Fall/Depression Screening: Fall Risk  07/11/2020 06/10/2020 05/09/2020  Falls in the past year? 0 0 0  Number falls in past yr: 0 0 0  Injury with Fall? 0 0 0  Follow up Falls evaluation completed Falls prevention discussed;Education provided;Falls evaluation completed Falls prevention discussed;Falls evaluation completed   PHQ 2/9 Scores 02/21/2020 02/20/2020 12/13/2018 08/25/2017 02/05/2016 08/07/2015  PHQ - 2 Score 0 2 0 0 0 0  PHQ- 9 Score 0 6 - - - -   Goals Addressed            This Visit's Progress   . Patient will maintain B/P below 140/85 within the next 90 days   On track    [place] (see longtitudinal plan of care for additional care plan information)  Objective:  . Last practice recorded BP readings:  BP Readings from Last 3 Encounters:  03/05/20 122/73  02/21/20 138/80  01/17/20 130/80 .   Marland Kitchen Most recent eGFR/CrCl: No results found for: EGFR  No components found for: CRCL  Current Barriers:  Marland Kitchen Knowledge deficit related to self care management of  hypertension  Case Manager Clinical Goal(s):  Marland Kitchen Over the next 90 days, patient will continue to take her B/P daily and record, follow a low sodium diet, stay physically active, and continue to go to the [facility].   Interventions:  . Evaluation of current treatment plan related to hypertension self management and patient's adherence to plan as established by provider. . Discussed plans with patient for ongoing care management follow up and provided patient with direct contact information for care management team  Patient Self Care Activities:  . Self administers medications as  prescribed . Monitors BP and records as discussed . Adheres to a low sodium diet/DASH diet . Increase physical activity as tolerated . Goes to the [facility] x2 weekly and walks routinely    Please see past updates related to this goal by clicking on the Past Update button in the selected goal           Plan: [place] will call patient within the month of October and patient agrees to future outreach calls.   Emelia Loron RN, BSN [place] [phone] [email or web address]

## 2020-07-18 ENCOUNTER — Other Ambulatory Visit: Payer: Self-pay | Admitting: *Deleted

## 2020-07-18 NOTE — Patient Outreach (Signed)
[facility]) Care Management  07/18/2020  Nancy Castro [birth year] [id number]   CSW spoke with pt today by phone who reports she continues to go to the [facility] activities and is enjoying the exercise and socializing.   Pt shared that her daughter surprised her on her birthday earlier this month by coming home.  "She got out a car with a mask and shield on and I did not recognize her....Marland Kitchenit brought tears to my eyes".  Pt denies any current concerns or needs- request a follow up call in 3-4 weeks.  CSW reminded both pt and her daughter to call if needs arise prior to CSW next outreach call.   Both pt and daughter are appreciative of the support.    Eduard Clos, MSW, [place] Worker  [place] [phone]

## 2020-08-08 ENCOUNTER — Other Ambulatory Visit: Payer: Self-pay | Admitting: *Deleted

## 2020-08-08 NOTE — Patient Outreach (Signed)
[facility]) Care Management  [place]  08/08/2020   Nancy Castro January 02, 1951 [id number]  Subjective: Successful telephone outreach call to patient. HIPAA identifiers obtained. Patient states she is doing well. She reports that her right hand pain is better and her knee pain has improved with going to the [facility] arthritis exercise class. Patient explains that her B/P readings have consistently been very good. She is eating a heart healthy diet and limiting her sodium intake, she goes to the [facility] x 2 weekly to exercise, she walks, and maintains her yard. Patient states that her home environment is safe and she is doing well emotionally. Patient did not have any further questions or concerns today.   Encounter Medications:  Outpatient Encounter Medications as of 08/08/2020  Medication Sig Note  . atorvastatin (LIPITOR) 40 MG tablet Take 1 tablet (40 mg total) by mouth daily.   . cholecalciferol (VITAMIN D3) 25 MCG (1000 UNIT) tablet Take 1 tablet (1,000 Units total) by mouth daily.   Marland Kitchen losartan (COZAAR) 50 MG tablet Take 1 tablet (50 mg total) by mouth daily.   . MULTIPLE VITAMIN PO Take 1 tablet by mouth daily.  01/07/2020: Pt daughter verified medication.  . niacin 250 MG tablet Take 2 tablets (500 mg total) by mouth at bedtime. 2 tablets OTC daily    No facility-administered encounter medications on file as of 08/08/2020.    Functional Status:  In your present state of health, do you have any difficulty performing the following activities: 02/21/2020 02/20/2020  Hearing? N N  Vision? N N  Difficulty concentrating or making decisions? N Y  Comment - has had episoeds of mild cognitive impairment, daughter and patient states, she is independent with ADLs and IADLs  Walking or climbing stairs? Y N  Dressing or bathing? N N  Doing errands, shopping? N N  Preparing Food and eating ? N N  Using the Toilet? N N  In the past six months, have you accidently  leaked urine? N N  Do you have problems with loss of bowel control? N N  Managing your Medications? N N  Managing your Finances? N N  Housekeeping or managing your Housekeeping? N N  Some recent data might be hidden    Fall/Depression Screening: Fall Risk  08/08/2020 07/11/2020 06/10/2020  Falls in the past year? 0 0 0  Number falls in past yr: 0 0 0  Injury with Fall? 0 0 0  Follow up Falls prevention discussed;Education provided;Falls evaluation completed Falls evaluation completed Falls prevention discussed;Education provided;Falls evaluation completed   PHQ 2/9 Scores 08/08/2020 02/21/2020 02/20/2020 12/13/2018 08/25/2017 02/05/2016 08/07/2015  PHQ - 2 Score 0 0 2 0 0 0 0  PHQ- 9 Score - 0 6 - - - -    Assessment:  Goals Addressed            This Visit's Progress   . Eat Healthy       Follow Up Date 10/01/20    - fill half of plate with vegetables - limit fast food meals to no more than 1 per week - manage portion size - prepare main meal at home 3 to 5 days each week - read food labels for fat, fiber, carbohydrates and portion size - set a realistic goal    Why is this important?   When you are ready to manage your nutrition or weight, having a plan and setting goals will help.  Taking small steps to change  how you eat and exercise is a good place to start.    Notes: Patient eats heart healthy and monitors sodium intake.    Marland Kitchen Learn More About My Health       Follow Up Date 10/01/20    - ask questions - bring a list of my medicines to the visit - speak up when I don't understand    Why is this important?   The best way to learn about your health and care is by talking to the doctor and nurse.  They will answer your questions and give you information in the way that you like best.    Notes:     . Lifestyle Change       Follow Up Date 10/01/20    - agree on reward when goals are met - agree to work together to make changes - ask questions to understand - learn  about high blood pressure    Why is this important?   The changes that you are asked to make may be hard to do.  This is especially true when the changes are life-long.  Knowing why it is important to you is the first step.  Working on the change with your family or support person helps you not feel alone.  Reward yourself and family or support person when goals are met. This can be an activity you choose like bowling, hiking, biking, swimming or shooting hoops.     Notes: Patient is motivated and has made changes to improve her healthy and wellness. She eats healthy, exercises routinely, has increased her social activities, and has good support from family and daughter [name].     . COMPLETED: Make and Keep All Appointments       Follow Up Date 08/08/20    - call to cancel if needed - keep a calendar with appointment dates    Why is this important?   Part of staying healthy is seeing the doctor for follow-up care.  If you forget your appointments, there are some things you can do to stay on track.    Notes: Patient currently does not have any issues with keeping her provider appointment dates    . Manage My Cholesterol       Follow Up Date 10/01/20    - change to whole grain breads, cereal, pasta - eat smaller or less servings of red meat - fill half the plate with nonstarchy vegetables - increase the amount of fiber in food - read food labels for fat and fiber - switch to low-fat or skim milk    Why is this important?   Changing cholesterol starts with eating heart-healthy foods.  Other steps may be to increase your activity and to quit if you smoke.    Notes: Patient states that her diet is healthy and that she does eat a lot of vegetables and fruits.    . Manage My Medicine       Follow Up Date 10/01/20    - call for medicine refill 2 or 3 days before it runs out - keep a list of all the medicines I take; vitamins and herbals too - learn to read medicine labels    Why  is this important?   These steps will help you keep on track with your medicines.    Notes: Patient is not having any difficulty managing her medications    . Patient will maintain B/P below 140/85 within the next 90 days  On track    [place] (see longtitudinal plan of care for additional care plan information)  Objective:  . Last practice recorded BP readings:  BP Readings from Last 3 Encounters:  03/05/20 122/73  02/21/20 138/80  01/17/20 130/80 .   Marland Kitchen Most recent eGFR/CrCl: No results found for: EGFR  No components found for: CRCL  Current Barriers:  Marland Kitchen Knowledge deficit related to self care management of hypertension  Case Manager Clinical Goal(s):  Marland Kitchen Over the next 90 days, patient will continue to take her B/P daily and record, follow a low sodium diet, stay physically active, and continue to go to the [facility].   Interventions:  . Evaluation of current treatment plan related to hypertension self management and patient's adherence to plan as established by provider. . Discussed plans with patient for ongoing care management follow up and provided patient with direct contact information for care management team  Patient Self Care Activities:  . Self administers medications as prescribed . Monitors BP and records as discussed . Adheres to a low sodium diet/DASH diet . Increase physical activity as tolerated . Goes to the [facility] x2 weekly and walks routinely    Please see past updates related to this goal by clicking on the Past Update button in the selected goal         . Track and Manage My Blood Pressure       Follow Up Date 10/01/20   - check blood pressure daily - write blood pressure results in a log or diary    Why is this important?   You won't feel high blood pressure, but it can still hurt your blood vessels.  High blood pressure can cause heart or kidney problems. It can also cause a stroke.  Making lifestyle changes like losing a  little weight or eating less salt will help.  Checking your blood pressure at home and at different times of the day can help to control blood pressure.  If the doctor prescribes medicine remember to take it the way the doctor ordered.  Call the office if you cannot afford the medicine or if there are questions about it.     Notes: Patient is taking and recording her B/P daily      Plan: [place] will call patient within the month of November and patient agrees to future outreach calls.   Emelia Loron RN, BSN [place] [phone] [email or web address]

## 2020-08-12 ENCOUNTER — Ambulatory Visit: Payer: Self-pay | Admitting: *Deleted

## 2020-08-14 ENCOUNTER — Other Ambulatory Visit: Payer: Self-pay | Admitting: *Deleted

## 2020-08-14 NOTE — Patient Outreach (Signed)
[place] [facility]) Care Management  08/14/2020  EMEREE MAHLER 1951/09/27 [id number]   CSW attempted to reach pt on 08/12/2020 and was unable. CSW left a HIPPA compliant voice message and will attempt again per policy.   Eduard Clos, MSW, [place] Worker  [place] [phone]

## 2020-08-15 ENCOUNTER — Other Ambulatory Visit: Payer: Self-pay | Admitting: *Deleted

## 2020-08-20 NOTE — Patient Outreach (Signed)
[facility]) Care Management  08/20/2020  Nancy Castro [birth year] [id number]   CSW spoke with pt on 08/15/2020 who reports her hands are hurting. She is planning to discuss with her PCP at next visit. Otherwise, pt reports she is enjoying the [facility].  Follow up appointment scheduled for 09/13/2020.   Eduard Clos, MSW, [place] Worker  [place] [phone]

## 2020-08-21 NOTE — Addendum Note (Signed)
Addended by: Carlena Hurl on: 08/21/2020 10:15 PM   Modules accepted: Level of Service

## 2020-08-22 ENCOUNTER — Other Ambulatory Visit: Payer: Self-pay

## 2020-08-22 ENCOUNTER — Ambulatory Visit (INDEPENDENT_AMBULATORY_CARE_PROVIDER_SITE_OTHER): Payer: Medicare Other | Admitting: Medical

## 2020-08-22 ENCOUNTER — Encounter: Payer: Self-pay | Admitting: Medical

## 2020-08-22 VITALS — BP 128/72 | HR 75 | Ht 61.0 in | Wt 134.8 lb

## 2020-08-22 DIAGNOSIS — I10 Essential (primary) hypertension: Secondary | ICD-10-CM

## 2020-08-22 DIAGNOSIS — M8949 Other hypertrophic osteoarthropathy, multiple sites: Secondary | ICD-10-CM

## 2020-08-22 DIAGNOSIS — M1711 Unilateral primary osteoarthritis, right knee: Secondary | ICD-10-CM

## 2020-08-22 DIAGNOSIS — R2681 Unsteadiness on feet: Secondary | ICD-10-CM | POA: Diagnosis not present

## 2020-08-22 DIAGNOSIS — E782 Mixed hyperlipidemia: Secondary | ICD-10-CM

## 2020-08-22 DIAGNOSIS — E559 Vitamin D deficiency, unspecified: Secondary | ICD-10-CM

## 2020-08-22 DIAGNOSIS — Z9181 History of falling: Secondary | ICD-10-CM

## 2020-08-22 DIAGNOSIS — Z7185 Encounter for immunization safety counseling: Secondary | ICD-10-CM

## 2020-08-22 DIAGNOSIS — G3184 Mild cognitive impairment, so stated: Secondary | ICD-10-CM

## 2020-08-22 DIAGNOSIS — M159 Polyosteoarthritis, unspecified: Secondary | ICD-10-CM

## 2020-08-22 MED ORDER — ACETAMINOPHEN-CODEINE #3 300-30 MG PO TABS: 1.0000 | ORAL_TABLET | Freq: Every evening | ORAL | 0 refills | Status: DC | PRN

## 2020-08-22 NOTE — Progress Notes (Signed)
Subjective:  Nancy Castro is a 69 y.o. female who presents for Chief Complaint  Patient presents with  . Hypertension    6 month follow up   . Knee Pain    right knee     Here for routine follow-up.  In general doing well  [facility] home health call nurse has been in contact with her on a regular basis for check  In on a regular basis.   Her daughter Nancy Castro speaks with her daily by phone.  Kikue states no concerns or worries about her ADLs.  She does her own chores, cooking, cleaning, bathing etc. daily.  She lives alone.  She notes that the issues she had with water pooling in her backyard got taken care of.  Grass was planted, and a trench was dug.  now things are fine.  Hypertension, follow-up  BP Readings from Last 3 Encounters:  08/22/20 128/72  06/20/20 120/68  03/05/20 122/73   Wt Readings from Last 3 Encounters:  08/22/20 134 lb 12.8 oz (61.1 kg)  06/20/20 138 lb 3.2 oz (62.7 kg)  03/05/20 137 lb 8 oz (62.4 kg)     She was last seen for hypertension 6 months ago.  BP at that visit was 138/80. Management since that visit includes Losartan 50 mg.  She reports good compliance with treatment. She is not having side effects. She is following a Regular diet. She is exercising. She does not smoke.  Use of agents associated with hypertension: none.   Outside blood pressures are 113/68-130/68 this week.  Symptoms: No chest pain No chest pressure  No palpitations No syncope  No dyspnea No orthopnea  No paroxysmal nocturnal dyspnea No lower extremity edema   Pertinent labs: Lab Results  Component Value Date   CHOL 209 (H) 02/21/2020   HDL 87 02/21/2020   LDLCALC 114 (H) 02/21/2020   TRIG 46 02/21/2020   CHOLHDL 2.4 02/21/2020   Lab Results  Component Value Date   NA 142 01/17/2020   K 4.5 01/17/2020   CREATININE 0.57 01/17/2020   GFRNONAA 95 01/17/2020   GFRAA 109 01/17/2020   GLUCOSE 85 01/17/2020     The 10-year ASCVD risk score Mikey Bussing DC Jr., et al., 2013) is:  12.9%   ---------------------------------------------------------------------------------------------------  Her main concern today is ongoing right knee pain.  She has a history of arthritis in her right knee.  History of arthroscopic surgery years ago by [facility].  When she mows her yard or goes out shopping or spends a lot of time walking around on a given day she does tend to have a lot of pain in her right knee.  She can actually fully extend her right knee.  500 mg of Tylenol twice a day does not help.  She even tried 650 mg Tylenol twice daily but that does not help and gives her dry mouth.  She is staying in pain and would like some way to fully extend her knee.  2 years ago she went to [facility] and had an injection.  The next day she had a burning sensation which really did not feel good.  No other new concerns    Past Medical History:  Diagnosis Date  . DDD (degenerative disc disease), cervical   . H/O echocardiogram 2007  . History of exercise stress test 2007  . Hyperlipidemia   . Hypertension   . Mixed dyslipidemia   . Osteoarthritis   . Syncope 2012   cardiac stress test   .  Wears glasses   . Wears partial dentures    upper and lower   Past Surgical History:  Procedure Laterality Date  . COLONOSCOPY     2014, polyps, repeat 3 years  . COLONOSCOPY  07/14/2018   tubular adenoma polyps, repeat 3 years, Dr. Zenovia Jarred  . KNEE ARTHROSCOPY     right  . LIPOMA EXCISION     back x 2  . PARTIAL HYSTERECTOMY     fibroids; has both ovaries    The following portions of the patient's history were reviewed and updated as appropriate: allergies, current medications, past family history, past medical history, past social history, past surgical history and problem list.  ROS Otherwise as in subjective above    Objective: BP 128/72   Pulse 75   Ht 5\' 1"  (1.549 m)   Wt 134 lb 12.8 oz (61.1 kg)   SpO2 97%   BMI 25.47 kg/m  Wt Readings from Last 3  Encounters:  08/22/20 134 lb 12.8 oz (61.1 kg)  06/20/20 138 lb 3.2 oz (62.7 kg)  03/05/20 137 lb 8 oz (62.4 kg)    General appearance: alert, no distress, well developed, well nourished Neck: supple, no lymphadenopathy, no thyromegaly, no masses Heart: RRR, normal S1, S2, no murmurs Lungs: CTA bilaterally, no wheezes, rhonchi, or rales Pulses: 2+ radial pulses, 2+ pedal pulses, normal cap refill Ext: no edema Right knee with significant bony arthritic changes at the tibial plateau, some tenderness to palpation, cannot fully extend the knee, decreased extension, no laxity, some localized swelling of the joint line, mildly, there is in bowing of the knee when she is standing, there is a click with flexing the knee over the joint line, otherwise no locking or other tenderness.  Rest of leg nontender or other swelling.  Left leg nontender with normal range of motion Psych: Pleasant, answers questions appropriate    Assessment: Encounter Diagnoses  Name Primary?  . Essential hypertension Yes  . Arthritis of right knee   . Primary osteoarthritis involving multiple joints   . Gait instability   . History of fall   . Mild cognitive impairment   . Mixed dyslipidemia   . Vaccine counseling   . Vitamin D deficiency       Plan: Hypertension-at goal, continue current medications.  I reviewed labs from spring 2021  Arthritis of knee, gait instability-I reviewed her x-ray images and report from 2019 of the right knee.  Significant arthritis.  She cannot fully extend the knee.  Supervising physician Dr. Redmond School also examine her.  We are going to refer her back to [facility] orthopedics for further evaluation and treatment recommendations In the meantime since Tylenol is not helping at all I wrote for some short-term Tylenol 3.  We discussed risk and benefits of medication, caution with sedation.  She can use this at nighttime only and topical over-the-counter analgesic cream during the  day.  Dyslipidemia-continue statin.  Stop aspirin given risk in recent study information  Vitamin D deficiency-continue supplement  Mild cognitive impairment-stable, doing well of late  Vaccine counseling-she will return soon for COVID vaccine booster as needed data is coming out soon regarding booster   Tonna was seen today for hypertension and knee pain.  Diagnoses and all orders for this visit:  Essential hypertension  Arthritis of right knee -     Ambulatory referral to Orthopedic Surgery  Primary osteoarthritis involving multiple joints -     Ambulatory referral to Orthopedic Surgery  Gait  instability  History of fall  Mild cognitive impairment  Mixed dyslipidemia  Vaccine counseling  Vitamin D deficiency  Other orders -     acetaminophen-codeine (TYLENOL #3) 300-30 MG tablet; Take 1 tablet by mouth at bedtime as needed for moderate pain.    Follow up: pending referral

## 2020-08-26 ENCOUNTER — Telehealth: Payer: Self-pay

## 2020-08-26 NOTE — Telephone Encounter (Signed)
P.A. ACETAMINOPHEN W/ COD #3

## 2020-08-28 NOTE — Telephone Encounter (Signed)
PA approved, pt informed.

## 2020-09-10 ENCOUNTER — Other Ambulatory Visit: Payer: Self-pay | Admitting: *Deleted

## 2020-09-10 NOTE — Patient Outreach (Signed)
[place] [facility]) Care Management  [place]  09/10/2020   Nancy Castro 1951-10-11 [id number]  Subjective: Successful telephone outreach call to patient. HIPAA identifiers obtained. Patient reports she is doing well. She states she recently saw her PCP who was pleased with the patient's hypertension control and also prescribed her pain medication for her chronic pain in her right knee which per patient has decreased the pain. Patient explained she was able to mow her yard this week and go to her exercise classes at the [facility] and did not experience the intense pain as she did previously. Patient shared that her daughter [name] has been in town visiting as well, patient feels that her home environment is safe, and emotionally she is in a good place.   Patient reports that her B/P ranges have remained consistently good and today's B/P was 113/71. She continues to be physically active, is adhering to a low sodium, and heart healthy diet. Patient's appetite has improved and she is maintaining her weight. She continues to supplement with Ensure and nurse will send her Ensure coupons in the mail. Patient states she does not have any further questions or concerns today and that she will contact this nurse if needed confirming that she does have this nurse's contact number.   Encounter Medications:  Outpatient Encounter Medications as of 09/10/2020  Medication Sig Note  . acetaminophen-codeine (TYLENOL #3) 300-30 MG tablet Take 1 tablet by mouth at bedtime as needed for moderate pain.   Marland Kitchen atorvastatin (LIPITOR) 40 MG tablet Take 1 tablet (40 mg total) by mouth daily.   . cholecalciferol (VITAMIN D3) 25 MCG (1000 UNIT) tablet Take 1 tablet (1,000 Units total) by mouth daily.   Marland Kitchen losartan (COZAAR) 50 MG tablet Take 1 tablet (50 mg total) by mouth daily.   . MULTIPLE VITAMIN PO Take 1 tablet by mouth daily.  01/07/2020: Pt daughter verified medication.  . niacin 250 MG tablet Take 2  tablets (500 mg total) by mouth at bedtime. 2 tablets OTC daily    No facility-administered encounter medications on file as of 09/10/2020.    Functional Status:  In your present state of health, do you have any difficulty performing the following activities: 02/21/2020 02/20/2020  Hearing? N N  Vision? N N  Difficulty concentrating or making decisions? N Y  Comment - has had episoeds of mild cognitive impairment, daughter and patient states, she is independent with ADLs and IADLs  Walking or climbing stairs? Y N  Dressing or bathing? N N  Doing errands, shopping? N N  Preparing Food and eating ? N N  Using the Toilet? N N  In the past six months, have you accidently leaked urine? N N  Do you have problems with loss of bowel control? N N  Managing your Medications? N N  Managing your Finances? N N  Housekeeping or managing your Housekeeping? N N  Some recent data might be hidden    Fall/Depression Screening: Fall Risk  09/10/2020 08/08/2020 07/11/2020  Falls in the past year? 0 0 0  Number falls in past yr: 0 0 0  Injury with Fall? 0 0 0  Follow up Falls prevention discussed;Education provided;Falls evaluation completed Falls prevention discussed;Education provided;Falls evaluation completed Falls evaluation completed   PHQ 2/9 Scores 08/08/2020 02/21/2020 02/20/2020 12/13/2018 08/25/2017 02/05/2016 08/07/2015  PHQ - 2 Score 0 0 2 0 0 0 0  PHQ- 9 Score - 0 6 - - - -    Assessment:  Goals Addressed            This Visit's Progress   . COMPLETED: Eat Healthy       Follow Up Date 09/10/20   - fill half of plate with vegetables - limit fast food meals to no more than 1 per week - manage portion size - prepare main meal at home 3 to 5 days each week - read food labels for fat, fiber, carbohydrates and portion size - set a realistic goal    Why is this important?   When you are ready to manage your nutrition or weight, having a plan and setting goals will help.  Taking small steps  to change how you eat and exercise is a good place to start.    Notes: Patient eats heart healthy and monitors sodium intake. Updated: 09/10/20    . COMPLETED: Learn More About My Health       Follow Up Date 09/10/20   - ask questions - bring a list of my medicines to the visit - speak up when I don't understand    Why is this important?   The best way to learn about your health and care is by talking to the doctor and nurse.  They will answer your questions and give you information in the way that you like best.    Notes: Patient is not having any issues with staying informed about her health needs. She does contact her providers as needed and will ask questions to ensure she understands.   Updated: 09/10/20    . COMPLETED: Lifestyle Change       Follow Up Date 09/10/20   - agree on reward when goals are met - agree to work together to make changes - ask questions to understand - learn about high blood pressure    Why is this important?   The changes that you are asked to make may be hard to do.  This is especially true when the changes are life-long.  Knowing why it is important to you is the first step.  Working on the change with your family or support person helps you not feel alone.  Reward yourself and family or support person when goals are met. This can be an activity you choose like bowling, hiking, biking, swimming or shooting hoops.     Notes: Patient is motivated and has made changes to improve her healthy and wellness. She eats healthy, exercises routinely, has increased her social activities, and has good support from family and daughter [name].  Updated: 09/10/20    . COMPLETED: Manage My Cholesterol       Follow Up Date 09/10/20   - change to whole grain breads, cereal, pasta - eat smaller or less servings of red meat - fill half the plate with nonstarchy vegetables - increase the amount of fiber in food - read food labels for fat and fiber - switch to low-fat  or skim milk    Why is this important?   Changing cholesterol starts with eating heart-healthy foods.  Other steps may be to increase your activity and to quit if you smoke.    Notes: Patient states that her diet is healthy and that she does eat a lot of vegetables and fruits. Patient continues to eat a heart healthy diet.   Updated: 09/10/20    . COMPLETED: Manage My Medicine       Follow Up Date 09/10/20   - call for medicine refill 2 or  3 days before it runs out - keep a list of all the medicines I take; vitamins and herbals too - learn to read medicine labels    Why is this important?   These steps will help you keep on track with your medicines.    Notes: Patient is not having any difficulty managing her medications Updated: 09/10/20    . Track and Manage My Blood Pressure       Follow Up Date 11/01/20   - check blood pressure daily - write blood pressure results in a log or diary    Why is this important?   You won't feel high blood pressure, but it can still hurt your blood vessels.  High blood pressure can cause heart or kidney problems. It can also cause a stroke.  Making lifestyle changes like losing a little weight or eating less salt will help.  Checking your blood pressure at home and at different times of the day can help to control blood pressure.  If the doctor prescribes medicine remember to take it the way the doctor ordered.  Call the office if you cannot afford the medicine or if there are questions about it.     Notes: Patient continues to take her B/P daily and records values Updated: 09/10/20      Plan: Balta will send PCP today's assessment note, will send patient Ensure coupons, will call patient within the month of December, and patient agrees to future outreach calls.   Emelia Loron RN, BSN [place] [phone] [email or web address]

## 2020-09-11 ENCOUNTER — Telehealth: Payer: Self-pay | Admitting: Medical

## 2020-09-11 NOTE — Telephone Encounter (Signed)
No, we try to keep shingles and covid shots separate.  So just come for Moderna, then at least 3 weeks later she could begin the Shingrix.  (check with Melissa on whether we can even do the Shingrix here from insurance standpoint - it may have to be done at pharmacy)

## 2020-09-11 NOTE — Telephone Encounter (Signed)
Pt is coming in next Tuesday for her monderna booster she wants to know if she can have her shingles shot at the same time

## 2020-09-12 ENCOUNTER — Ambulatory Visit: Payer: Self-pay | Admitting: *Deleted

## 2020-09-13 ENCOUNTER — Ambulatory Visit: Payer: Self-pay | Admitting: *Deleted

## 2020-09-17 ENCOUNTER — Ambulatory Visit (INDEPENDENT_AMBULATORY_CARE_PROVIDER_SITE_OTHER): Payer: Medicare Other

## 2020-09-17 ENCOUNTER — Other Ambulatory Visit: Payer: Self-pay

## 2020-09-17 ENCOUNTER — Ambulatory Visit: Payer: Medicare Other

## 2020-09-17 DIAGNOSIS — Z23 Encounter for immunization: Secondary | ICD-10-CM

## 2020-09-19 ENCOUNTER — Other Ambulatory Visit: Payer: Self-pay | Admitting: *Deleted

## 2020-09-19 NOTE — Patient Outreach (Signed)
[place] [facility]) Care Management  09/19/2020  Nancy Castro 1951/05/04 [id number]   CSW made contact with pt today who reports she is outside enjoying the sunny weather.  She continues to enjoy getting out to the senior center for activities and socializing.  She also reports receiving her Booster shot this week with no side effects.   Pt was seen by her PCP and pt reports started on something to help with her leg pain/cramps at night.  Pt's daughter visited recently and she denies any current concerns or needs.  CSW offered to call pt again in 3-4 weeks.   Eduard Clos, MSW, [place] Worker  [place] [phone]

## 2020-10-03 ENCOUNTER — Other Ambulatory Visit: Payer: Self-pay | Admitting: *Deleted

## 2020-10-03 NOTE — Patient Instructions (Signed)
Goals Addressed            This Visit's Progress   . Patient will maintain B/P below 140/85 within the next 90 days   On track    [place] (see longtitudinal plan of care for additional care plan information)  Objective:  . Last practice recorded BP readings:  BP Readings from Last 3 Encounters:  03/05/20 122/73  02/21/20 138/80  01/17/20 130/80 .   Marland Kitchen Most recent eGFR/CrCl: No results found for: EGFR  No components found for: CRCL  Current Barriers:  Marland Kitchen Knowledge deficit related to self care management of hypertension  Case Manager Clinical Goal(s):  Marland Kitchen Over the next 90 days, patient will continue to take her B/P daily and record, follow a low sodium diet, stay physically active, and continue to go to the [facility].   Interventions:  . Evaluation of current treatment plan related to hypertension self management and patient's adherence to plan as established by provider. . Discussed plans with patient for ongoing care management follow up and provided patient with direct contact information for care management team . Provided education regarding s/s of DASH diet/Low salt diet . Provided educational material: Matter of Choice Blood Pressure Control  Patient Self Care Activities:  . Self administers medications as prescribed . Monitors BP and records as discussed . Adheres to a low sodium diet/DASH diet . Increase physical activity as tolerated . Goes to the [facility] x2 weekly and walks routinely    Please see past updates related to this goal by clicking on the Past Update button in the selected goal Updated: 10/03/20        . Track and Manage My Blood Pressure       Follow Up Date 12/01/20   - check blood pressure daily - write blood pressure results in a log or diary    Why is this important?   You won't feel high blood pressure, but it can still hurt your blood vessels.  High blood pressure can cause heart or kidney problems. It can also cause a  stroke.  Making lifestyle changes like losing a little weight or eating less salt will help.  Checking your blood pressure at home and at different times of the day can help to control blood pressure.  If the doctor prescribes medicine remember to take it the way the doctor ordered.  Call the office if you cannot afford the medicine or if there are questions about it.     Notes: Patient continues to take her B/P daily and records values. She exercises and the [facility] and is eating a heart healthy, low salt diet. Updated: 10/03/20

## 2020-10-03 NOTE — Patient Outreach (Signed)
[facility] [facility]) Care Management  [place]  10/03/2020   GEM CONKLE [birth year] [id number]  Subjective: Successful telephone outreach call to patient. HIPAA identifiers obtained. Patient states she is doing well. She continues to go to the [facility] twice weekly for exercise classes and socialization. Patient reports that her B/P continues to be well controlled. She monitors her B/P daily and records the values with today's value of 130/66. Patient adheres to eating a heart healthy and low sodium diet. Per patient, emotionally, she is in a good place and she is looking forward to the up coming holidays.  Encounter Medications:  Outpatient Encounter Medications as of 10/03/2020  Medication Sig Note  . acetaminophen-codeine (TYLENOL #3) 300-30 MG tablet Take 1 tablet by mouth at bedtime as needed for moderate pain.   Marland Kitchen atorvastatin (LIPITOR) 40 MG tablet Take 1 tablet (40 mg total) by mouth daily.   . cholecalciferol (VITAMIN D3) 25 MCG (1000 UNIT) tablet Take 1 tablet (1,000 Units total) by mouth daily.   Marland Kitchen losartan (COZAAR) 50 MG tablet Take 1 tablet (50 mg total) by mouth daily.   . MULTIPLE VITAMIN PO Take 1 tablet by mouth daily.  01/07/2020: Pt daughter verified medication.  . niacin 250 MG tablet Take 2 tablets (500 mg total) by mouth at bedtime. 2 tablets OTC daily    No facility-administered encounter medications on file as of 10/03/2020.    Functional Status:  In your present state of health, do you have any difficulty performing the following activities: 02/21/2020 02/20/2020  Hearing? N N  Vision? N N  Difficulty concentrating or making decisions? N Y  Comment - has had episoeds of mild cognitive impairment, daughter and patient states, she is independent with ADLs and IADLs  Walking or climbing stairs? Y N  Dressing or bathing? N N  Doing errands, shopping? N N  Preparing Food and eating ? N N  Using the Toilet? N N  In the past six months, have you  accidently leaked urine? N N  Do you have problems with loss of bowel control? N N  Managing your Medications? N N  Managing your Finances? N N  Housekeeping or managing your Housekeeping? N N  Some recent data might be hidden    Fall/Depression Screening: Fall Risk  10/03/2020 09/10/2020 08/08/2020  Falls in the past year? 0 0 0  Number falls in past yr: 0 0 0  Injury with Fall? 0 0 0  Follow up Falls evaluation completed;Education provided;Falls prevention discussed Falls prevention discussed;Education provided;Falls evaluation completed Falls prevention discussed;Education provided;Falls evaluation completed   PHQ 2/9 Scores 08/08/2020 02/21/2020 02/20/2020 12/13/2018 08/25/2017 02/05/2016 08/07/2015  PHQ - 2 Score 0 0 2 0 0 0 0  PHQ- 9 Score - 0 6 - - - -    Assessment:  Goals Addressed            This Visit's Progress   . Patient will maintain B/P below 140/85 within the next 90 days   On track    [place] (see longtitudinal plan of care for additional care plan information)  Objective:  . Last practice recorded BP readings:  BP Readings from Last 3 Encounters:  03/05/20 122/73  02/21/20 138/80  01/17/20 130/80 .   Marland Kitchen Most recent eGFR/CrCl: No results found for: EGFR  No components found for: CRCL  Current Barriers:  Marland Kitchen Knowledge deficit related to self care management of hypertension  Case Manager Clinical Goal(s):  .  Over the next 90 days, patient will continue to take her B/P daily and record, follow a low sodium diet, stay physically active, and continue to go to the [facility].   Interventions:  . Evaluation of current treatment plan related to hypertension self management and patient's adherence to plan as established by provider. . Discussed plans with patient for ongoing care management follow up and provided patient with direct contact information for care management team . Provided education regarding s/s of DASH diet/Low salt diet . Provided educational  material: Matter of Choice Blood Pressure Control  Patient Self Care Activities:  . Self administers medications as prescribed . Monitors BP and records as discussed . Adheres to a low sodium diet/DASH diet . Increase physical activity as tolerated . Goes to the [facility] x2 weekly and walks routinely    Please see past updates related to this goal by clicking on the Past Update button in the selected goal Updated: 10/03/20        . Track and Manage My Blood Pressure       Follow Up Date 12/01/20   - check blood pressure daily - write blood pressure results in a log or diary    Why is this important?   You won't feel high blood pressure, but it can still hurt your blood vessels.  High blood pressure can cause heart or kidney problems. It can also cause a stroke.  Making lifestyle changes like losing a little weight or eating less salt will help.  Checking your blood pressure at home and at different times of the day can help to control blood pressure.  If the doctor prescribes medicine remember to take it the way the doctor ordered.  Call the office if you cannot afford the medicine or if there are questions about it.     Notes: Patient continues to take her B/P daily and records values. She exercises and the [facility] and is eating a heart healthy, low salt diet. Updated: 10/03/20      Plan: RN Health Coach will send patient Ensure coupons, will call patient within the month of January, and patient agrees to future outreach calls.   Emelia Loron RN, BSN [place] [phone] [email or web address]

## 2020-10-14 ENCOUNTER — Other Ambulatory Visit: Payer: Self-pay | Admitting: *Deleted

## 2020-10-14 NOTE — Patient Outreach (Signed)
[place] [facility]) Care Management  10/14/2020  Nancy Castro 31-Oct-1951 [id number]  CSW spoke with pt who reports she is doing "great".  She continues to be active and involved; as much as she can given COVID restrictions.  Pt has been able to get some relief with her legs/cramps; stating the RX and OTC turmeric and mustard have helped.   Pt is in a good place and agrees to plans for CSW to sign off.   CSW will update PCP and [facility] team of above.     Eduard Clos, MSW, [place] Worker  [place] [phone]

## 2020-11-04 ENCOUNTER — Other Ambulatory Visit: Payer: Self-pay | Admitting: *Deleted

## 2020-11-04 NOTE — Patient Outreach (Signed)
[place] [facility]) Care Management  [place]  11/04/2020   ERIYONNA MATSUSHITA 10/03/1951 [id number]  Subjective: Successful telephone outreach call to patient. HIPAA identifiers obtained. Patient states she is doing well. She continues to go to the [facility] twice weekly for exercise classes and socialization. Her daughter [name] came home for the holidays and the patient had a wonderful time enjoying the time they spent together.  Patient reports that her B/P continues to be well controlled. She monitors her B/P daily and records the values with today's value of 116/73. Patient adheres to eating a heart healthy, low sodium diet, and states that she plans to increase the amount of fruits and vegetables she consumes for her overall health. Patient reports that her appetite has improved and she continues to supplement with Ensure as needed.  Per patient, emotionally, she is in a good place and she and her home environment is safe. Patient did not have any further questions or concerns today and did confirm that she has this nurse's contact number to call her if needed.    Encounter Medications:  Outpatient Encounter Medications as of 11/04/2020  Medication Sig Note  . acetaminophen-codeine (TYLENOL #3) 300-30 MG tablet Take 1 tablet by mouth at bedtime as needed for moderate pain.   Marland Kitchen atorvastatin (LIPITOR) 40 MG tablet Take 1 tablet (40 mg total) by mouth daily.   . cholecalciferol (VITAMIN D3) 25 MCG (1000 UNIT) tablet Take 1 tablet (1,000 Units total) by mouth daily.   Marland Kitchen losartan (COZAAR) 50 MG tablet Take 1 tablet (50 mg total) by mouth daily.   . MULTIPLE VITAMIN PO Take 1 tablet by mouth daily.  01/07/2020: Pt daughter verified medication.  . niacin 250 MG tablet Take 2 tablets (500 mg total) by mouth at bedtime. 2 tablets OTC daily    No facility-administered encounter medications on file as of 11/04/2020.    Functional Status:  In your present state of health, do you have any  difficulty performing the following activities: 02/21/2020 02/20/2020  Hearing? N N  Vision? N N  Difficulty concentrating or making decisions? N Y  Comment - has had episoeds of mild cognitive impairment, daughter and patient states, she is independent with ADLs and IADLs  Walking or climbing stairs? Y N  Dressing or bathing? N N  Doing errands, shopping? N N  Preparing Food and eating ? N N  Using the Toilet? N N  In the past six months, have you accidently leaked urine? N N  Do you have problems with loss of bowel control? N N  Managing your Medications? N N  Managing your Finances? N N  Housekeeping or managing your Housekeeping? N N  Some recent data might be hidden    Fall/Depression Screening: Fall Risk  11/04/2020 11/04/2020 10/03/2020  Falls in the past year? 0 0 0  Number falls in past yr: 0 0 0  Injury with Fall? 0 0 0  Follow up Falls evaluation completed;Falls prevention discussed;Education provided Falls evaluation completed Falls evaluation completed;Education provided;Falls prevention discussed   PHQ 2/9 Scores 08/08/2020 02/21/2020 02/20/2020 12/13/2018 08/25/2017 02/05/2016 08/07/2015  PHQ - 2 Score 0 0 2 0 0 0 0  PHQ- 9 Score - 0 6 - - - -    Assessment:  Goals Addressed            This Visit's Progress   . Patient will maintain B/P below 140/85 within the next 90 days   On track  CARE PLAN ENTRY (see longtitudinal plan of care for additional care plan information)  Objective:  . Last practice recorded BP readings:  BP Readings from Last 3 Encounters:  03/05/20 122/73  02/21/20 138/80  01/17/20 130/80 .   Marland Kitchen Most recent eGFR/CrCl: No results found for: EGFR  No components found for: CRCL  Current Barriers:  Marland Kitchen Knowledge deficit related to self care management of hypertension  Case Manager Clinical Goal(s):  Marland Kitchen Over the next 90 days, patient will continue to take her B/P daily and record, follow a low sodium diet, stay physically active, and continue to go to  the [facility].   Interventions:  . Evaluation of current treatment plan related to hypertension self management and patient's adherence to plan as established by provider. . Discussed plans with patient for ongoing care management follow up and provided patient with direct contact information for care management team . Provided education regarding s/s of DASH diet/Low salt diet . Provided educational material: Matter of Choice Blood Pressure Control  Patient Self Care Activities:  . Self administers medications as prescribed . Monitors BP and records as discussed . Adheres to a low sodium diet/DASH diet . Increase physical activity as tolerated . Goes to the [facility] x2 weekly and walks routinely . Patient states that she has increased the amount of vegetables and fruits she eats.    Please see past updates related to this goal by clicking on the Past Update button in the selected goal Updated: 11/04/20  Timeframe:  Long-Range Goal Priority:  High Start Date:  05/09/20                           Expected End Date:  10/01/21 Follow-up: 01/29/21                             . Track and Manage My Blood Pressure   On track    Timeframe:  Long-Range Goal Priority:  High Start Date: 08/08/20                            Expected End Date: 10/01/21                     Follow Up Date 01/29/21   - check blood pressure daily - write blood pressure results in a log or diary    Why is this important?   You won't feel high blood pressure, but it can still hurt your blood vessels.  High blood pressure can cause heart or kidney problems. It can also cause a stroke.  Making lifestyle changes like losing a little weight or eating less salt will help.  Checking your blood pressure at home and at different times of the day can help to control blood pressure.  If the doctor prescribes medicine remember to take it the way the doctor ordered.  Call the office if you cannot afford the medicine  or if there are questions about it.     Notes: Patient continues to take her B/P daily and records values. She exercises at the [facility] and is eating a heart healthy, low salt diet. Updated: 11/04/20      Plan: RN Health Coach will call patient within the month of February. Follow-up:  Patient agrees to Care Plan and Follow-up.   Emelia Loron RN, BSN [facility] Care  Management  RN Health Coach [phone] [email or web address]

## 2020-11-04 NOTE — Patient Instructions (Signed)
Goals Addressed            This Visit's Progress   . Patient will maintain B/P below 140/85 within the next 90 days   On track    [place] (see longtitudinal plan of care for additional care plan information)  Objective:  . Last practice recorded BP readings:  BP Readings from Last 3 Encounters:  03/05/20 122/73  02/21/20 138/80  01/17/20 130/80 .   Marland Kitchen Most recent eGFR/CrCl: No results found for: EGFR  No components found for: CRCL  Current Barriers:  Marland Kitchen Knowledge deficit related to self care management of hypertension  Case Manager Clinical Goal(s):  Marland Kitchen Over the next 90 days, patient will continue to take her B/P daily and record, follow a low sodium diet, stay physically active, and continue to go to the [facility].   Interventions:  . Evaluation of current treatment plan related to hypertension self management and patient's adherence to plan as established by provider. . Discussed plans with patient for ongoing care management follow up and provided patient with direct contact information for care management team . Provided education regarding s/s of DASH diet/Low salt diet . Provided educational material: Matter of Choice Blood Pressure Control  Patient Self Care Activities:  . Self administers medications as prescribed . Monitors BP and records as discussed . Adheres to a low sodium diet/DASH diet . Increase physical activity as tolerated . Goes to the [facility] x2 weekly and walks routinely . Patient states that she has increased the amount of vegetables and fruits she eats.    Please see past updates related to this goal by clicking on the Past Update button in the selected goal Updated: 11/04/20  Timeframe:  Long-Range Goal Priority:  High Start Date:  05/09/20                           Expected End Date:  10/01/21 Follow-up: 01/29/21                             . Track and Manage My Blood Pressure   On track    Timeframe:  Long-Range  Goal Priority:  High Start Date: 08/08/20                            Expected End Date: 10/01/21                     Follow Up Date 01/29/21   - check blood pressure daily - write blood pressure results in a log or diary    Why is this important?   You won't feel high blood pressure, but it can still hurt your blood vessels.  High blood pressure can cause heart or kidney problems. It can also cause a stroke.  Making lifestyle changes like losing a little weight or eating less salt will help.  Checking your blood pressure at home and at different times of the day can help to control blood pressure.  If the doctor prescribes medicine remember to take it the way the doctor ordered.  Call the office if you cannot afford the medicine or if there are questions about it.     Notes: Patient continues to take her B/P daily and records values. She exercises at the [facility] and is eating a heart healthy, low salt diet.  Updated: 11/04/20

## 2020-12-05 ENCOUNTER — Other Ambulatory Visit: Payer: Self-pay | Admitting: *Deleted

## 2020-12-05 NOTE — Patient Instructions (Signed)
Goals Addressed            This Visit's Progress   . COMPLETED: Patient will maintain B/P below 140/85 within the next 90 days       [place] (see longtitudinal plan of care for additional care plan information)  Objective:  . Last practice recorded BP readings:  BP Readings from Last 3 Encounters:  03/05/20 122/73  02/21/20 138/80  01/17/20 130/80 .   Marland Kitchen Most recent eGFR/CrCl: No results found for: EGFR  No components found for: CRCL  Current Barriers:  Marland Kitchen Knowledge deficit related to self care management of hypertension  Case Manager Clinical Goal(s):  Marland Kitchen Over the next 90 days, patient will continue to take her B/P daily and record, follow a low sodium diet, stay physically active, and continue to go to the [facility].   Interventions:  . Evaluation of current treatment plan related to hypertension self management and patient's adherence to plan as established by provider. . Discussed plans with patient for ongoing care management follow up and provided patient with direct contact information for care management team . Provided education regarding s/s of DASH diet/Low salt diet . Provided educational material: Matter of Choice Blood Pressure Control  Patient Self Care Activities:  . Self administers medications as prescribed . Monitors BP and records as discussed . Adheres to a low sodium diet/DASH diet . Increase physical activity as tolerated . Goes to the [facility] x2 weekly and walks routinely . Patient states that she has increased the amount of vegetables and fruits she eats.    Please see past updates related to this goal by clicking on the Past Update button in the selected goal Updated: 11/04/20  Timeframe:  Long-Range Goal Priority:  High Start Date:  05/09/20                           Expected End Date:  10/01/21 Follow-up: 01/29/21                     Resolved 03/06/36 due to duplicate goals.         [facility] Patient will verbalize maintaining a B/P  below 150/90 for the next 90 days       Timeframe:  Long-Range Goal Priority:  High Start Date: 08/08/20                            Expected End Date: 10/01/21                     Follow Up Date 01/29/21   - check blood pressure daily - write blood pressure results in a log or diary    Why is this important?   You won't feel high blood pressure, but it can still hurt your blood vessels.  High blood pressure can cause heart or kidney problems. It can also cause a stroke.  Making lifestyle changes like losing a little weight or eating less salt will help.  Checking your blood pressure at home and at different times of the day can help to control blood pressure.  If the doctor prescribes medicine remember to take it the way the doctor ordered.  Call the office if you cannot afford the medicine or if there are questions about it.     Notes: Patient continues to take her B/P daily and records values. She exercises  at the [facility] and is eating a heart healthy, low salt diet. Updated: 11/04/20 Updated 12/05/20: Patient reports continuing to go to the [facility] to exercise and socialize twice weekly. Patient takes her B/P daily and records the values. She limits the amount of salt she eats in her diet and several servings of fruits and vegetables daily.     [facility] Patient will verbalize that she is enjoying sewing within the next 90 days       Timeframe:  Long-Range Goal Priority:  Medium Start Date:  12/05/20                           Expected End Date: 05/01/21 Follow Up Date: 01/29/21                     Patient wants to start sewing again because she enjoys sewing and it is relaxing.  She states she is going to check out her sewing machine to see if it still works. If it does not her daughter is going to buy her a new sewing machine. Patient wants to begin to sew some flannel pajamas.

## 2020-12-05 NOTE — Patient Outreach (Signed)
[facility]) Care Management  [place]  12/05/2020   Nancy Castro 1951/03/26 [id number]  Subjective: Successful telephone outreach call to patient. HIPAA identifiers obtained. Patient states she is doing well. She continues to go to the [facility] twice weekly for exercise classes and socialization. Patient reports that her B/P continues to be well controlled. She monitors her B/P daily and records the values with today's value of 132/82. Patient adheres to eating a heart healthy, low sodium diet, and she has increase the amount of fruits and vegetables she consumes for her overall health. Patient reports that her home environment is safe and that all of her needs are being met at this time. Patient did not have any further questions or concerns today and did confirm that she has this nurse's contact number to call her if needed.    Encounter Medications:  Outpatient Encounter Medications as of 12/05/2020  Medication Sig Note  . acetaminophen-codeine (TYLENOL #3) 300-30 MG tablet Take 1 tablet by mouth at bedtime as needed for moderate pain.   Marland Kitchen atorvastatin (LIPITOR) 40 MG tablet Take 1 tablet (40 mg total) by mouth daily.   . cholecalciferol (VITAMIN D3) 25 MCG (1000 UNIT) tablet Take 1 tablet (1,000 Units total) by mouth daily.   Marland Kitchen losartan (COZAAR) 50 MG tablet Take 1 tablet (50 mg total) by mouth daily.   . MULTIPLE VITAMIN PO Take 1 tablet by mouth daily.  01/07/2020: Pt daughter verified medication.  . niacin 250 MG tablet Take 2 tablets (500 mg total) by mouth at bedtime. 2 tablets OTC daily    No facility-administered encounter medications on file as of 12/05/2020.    Functional Status:  In your present state of health, do you have any difficulty performing the following activities: 02/21/2020 02/20/2020  Hearing? N N  Vision? N N  Difficulty concentrating or making decisions? N Y  Comment - has had episoeds of mild cognitive impairment, daughter and patient  states, she is independent with ADLs and IADLs  Walking or climbing stairs? Y N  Dressing or bathing? N N  Doing errands, shopping? N N  Preparing Food and eating ? N N  Using the Toilet? N N  In the past six months, have you accidently leaked urine? N N  Do you have problems with loss of bowel control? N N  Managing your Medications? N N  Managing your Finances? N N  Housekeeping or managing your Housekeeping? N N  Some recent data might be hidden    Fall/Depression Screening: Fall Risk  12/05/2020 11/04/2020 11/04/2020  Falls in the past year? 0 0 0  Number falls in past yr: 0 0 0  Injury with Fall? 0 0 0  Follow up - Falls evaluation completed;Falls prevention discussed;Education provided Falls evaluation completed   PHQ 2/9 Scores 08/08/2020 02/21/2020 02/20/2020 12/13/2018 08/25/2017 02/05/2016 08/07/2015  PHQ - 2 Score 0 0 2 0 0 0 0  PHQ- 9 Score - 0 6 - - - -    Assessment:  Goals Addressed            This Visit's Progress   . COMPLETED: Patient will maintain B/P below 140/85 within the next 90 days       [place] (see longtitudinal plan of care for additional care plan information)  Objective:  . Last practice recorded BP readings:  BP Readings from Last 3 Encounters:  03/05/20 122/73  02/21/20 138/80  01/17/20 130/80 .   Marland Kitchen  Most recent eGFR/CrCl: No results found for: EGFR  No components found for: CRCL  Current Barriers:  Marland Kitchen Knowledge deficit related to self care management of hypertension  Case Manager Clinical Goal(s):  Marland Kitchen Over the next 90 days, patient will continue to take her B/P daily and record, follow a low sodium diet, stay physically active, and continue to go to the [facility].   Interventions:  . Evaluation of current treatment plan related to hypertension self management and patient's adherence to plan as established by provider. . Discussed plans with patient for ongoing care management follow up and provided patient with direct contact  information for care management team . Provided education regarding s/s of DASH diet/Low salt diet . Provided educational material: Matter of Choice Blood Pressure Control  Patient Self Care Activities:  . Self administers medications as prescribed . Monitors BP and records as discussed . Adheres to a low sodium diet/DASH diet . Increase physical activity as tolerated . Goes to the [facility] x2 weekly and walks routinely . Patient states that she has increased the amount of vegetables and fruits she eats.    Please see past updates related to this goal by clicking on the Past Update button in the selected goal Updated: 11/04/20  Timeframe:  Long-Range Goal Priority:  High Start Date:  05/09/20                           Expected End Date:  10/01/21 Follow-up: 01/29/21                     Resolved 01/05/99 due to duplicate goals.         [facility] Patient will verbalize maintaining a B/P below 150/90 for the next 90 days       Timeframe:  Long-Range Goal Priority:  High Start Date: 08/08/20                            Expected End Date: 10/01/21                     Follow Up Date 01/29/21   - check blood pressure daily - write blood pressure results in a log or diary    Why is this important?   You won't feel high blood pressure, but it can still hurt your blood vessels.  High blood pressure can cause heart or kidney problems. It can also cause a stroke.  Making lifestyle changes like losing a little weight or eating less salt will help.  Checking your blood pressure at home and at different times of the day can help to control blood pressure.  If the doctor prescribes medicine remember to take it the way the doctor ordered.  Call the office if you cannot afford the medicine or if there are questions about it.     Notes: Patient continues to take her B/P daily and records values. She exercises at the [facility] and is eating a heart healthy, low salt diet. Updated: 11/04/20 Updated  12/05/20: Patient reports continuing to go to the [facility] to exercise and socialize twice weekly. Patient takes her B/P daily and records the values. She limits the amount of salt she eats in her diet and several servings of fruits and vegetables daily.     [facility] Patient will verbalize that she is enjoying sewing within the next 90  days       Timeframe:  Long-Range Goal Priority:  Medium Start Date:  12/05/20                           Expected End Date: 05/01/21 Follow Up Date: 01/29/21                     Patient wants to start sewing again because she enjoys sewing and it is relaxing.  She states she is going to check out her sewing machine to see if it still works. If it does not her daughter is going to buy her a new sewing machine. Patient wants to begin to sew some flannel pajamas.       Plan: RN Health Coach will send PCP a quarterly report and call patient within the month of March. Follow-up:  Patient agrees to Care Plan and Follow-up.  Emelia Loron RN, BSN [place] [phone] [email or web address]

## 2021-01-16 ENCOUNTER — Other Ambulatory Visit: Payer: Self-pay | Admitting: *Deleted

## 2021-01-16 NOTE — Patient Instructions (Signed)
Goals Addressed            This Visit's Progress   . [facility] Patient will verbalize maintaining a B/P below 150/90 for the next 90 days       Timeframe:  Long-Range Goal Priority:  High Start Date: 08/08/20                            Expected End Date: 10/01/21                     Follow Up Date 01/29/21   - check blood pressure daily - write blood pressure results in a log or diary  -Encouraged patient to continue to eat a low salt and heart healthy diet -Encouraged patient to continue to exercise at the [facility] times weekly -Discussed going outside to do yard work, garden, and enjoy nature.   Why is this important?   You won't feel high blood pressure, but it can still hurt your blood vessels.  High blood pressure can cause heart or kidney problems. It can also cause a stroke.  Making lifestyle changes like losing a little weight or eating less salt will help.  Checking your blood pressure at home and at different times of the day can help to control blood pressure.  If the doctor prescribes medicine remember to take it the way the doctor ordered.  Call the office if you cannot afford the medicine or if there are questions about it.     Notes: Patient continues to take her B/P daily and records values. She exercises at the [facility] and is eating a heart healthy, low salt diet. Updated: 11/04/20 Updated 12/05/20: Patient reports continuing to go to the [facility] to exercise and socialize twice weekly. Patient takes her B/P daily and records the values. She limits the amount of salt she eats in her diet and several servings of fruits and vegetables daily.   Updated 01/16/21: Patient continues to monitor her B/P daily and record the values. She goes to the [facility] twice weekly to exercise and socialize. Patient states she eats a lot of fruits and vegetables daily and is adhering to a low sodium diet. Patient looks forward to spending time outside doing yard work and gardening  now that the weather is getting warmer.      [facility] Patient will verbalize that she is enjoying sewing within the next 90 days   On track    Timeframe:  Long-Range Goal Priority:  Medium Start Date:  12/05/20                           Expected End Date: 05/01/21 Follow Up Date: 05/01/21                    Patient wants to start sewing again because she enjoys sewing and it is relaxing.  She states she is going to check out her sewing machine to see if it still works. If it does not her daughter is going to buy her a new sewing machine. Patient wants to begin to sew some flannel pajamas.   Updated 01/16/21: Patient states that her sewing machine does work and she bought some material that she loves. She plans to start to sew an outfit soon.

## 2021-01-16 NOTE — Patient Outreach (Addendum)
[facility]) Care Management  [place]  01/16/2021   Nancy Castro [birth year] [id number]  Subjective: Successful telephone outreach call to patient. HIPAA identifiers obtained. Patient reports she is doing very well. Her B/P continues to be well controlled. She monitors her B/P daily and does record the values in a log. Patient goes to the [facility] twice weekly to exercise and socialize. She does adhere to a low sodium and heart healthy diet explaining that she does eat a lot of fruits and vegetables daily. Patient states she is looking forward to spending time outside doing yard work and gardening now that the weather is getting warm. Patient did not have any further questions or concerns today and did confirm that she has this nurse's contact number to call her if needed.   Encounter Medications:  Outpatient Encounter Medications as of 01/16/2021  Medication Sig Note  . acetaminophen-codeine (TYLENOL #3) 300-30 MG tablet Take 1 tablet by mouth at bedtime as needed for moderate pain.   Marland Kitchen atorvastatin (LIPITOR) 40 MG tablet Take 1 tablet (40 mg total) by mouth daily.   . cholecalciferol (VITAMIN D3) 25 MCG (1000 UNIT) tablet Take 1 tablet (1,000 Units total) by mouth daily.   Marland Kitchen losartan (COZAAR) 50 MG tablet Take 1 tablet (50 mg total) by mouth daily.   . MULTIPLE VITAMIN PO Take 1 tablet by mouth daily.  01/07/2020: Pt daughter verified medication.  . niacin 250 MG tablet Take 2 tablets (500 mg total) by mouth at bedtime. 2 tablets OTC daily    No facility-administered encounter medications on file as of 01/16/2021.    Functional Status:  In your present state of health, do you have any difficulty performing the following activities: 02/21/2020 02/20/2020  Hearing? N N  Vision? N N  Difficulty concentrating or making decisions? N Y  Comment - has had episoeds of mild cognitive impairment, daughter and patient states, she is independent with ADLs and IADLs   Walking or climbing stairs? Y N  Dressing or bathing? N N  Doing errands, shopping? N N  Preparing Food and eating ? N N  Using the Toilet? N N  In the past six months, have you accidently leaked urine? N N  Do you have problems with loss of bowel control? N N  Managing your Medications? N N  Managing your Finances? N N  Housekeeping or managing your Housekeeping? N N  Some recent data might be hidden    Fall/Depression Screening: Fall Risk  01/16/2021 12/05/2020 11/04/2020  Falls in the past year? 0 0 0  Number falls in past yr: 0 0 0  Injury with Fall? 0 0 0  Follow up Falls prevention discussed;Education provided;Falls evaluation completed - Falls evaluation completed;Falls prevention discussed;Education provided   [facility] 2/9 Scores 08/08/2020 02/21/2020 02/20/2020 12/13/2018 08/25/2017 02/05/2016 08/07/2015  PHQ - 2 Score 0 0 2 0 0 0 0  PHQ- 9 Score - 0 6 - - - -    Assessment:  Goals Addressed            This Visit's Progress   . [facility] Patient will verbalize maintaining a B/P below 150/90 for the next 90 days       Timeframe:  Long-Range Goal Priority:  High Start Date: 08/08/20                            Expected End Date: 10/01/21  Follow Up Date 01/29/21   - check blood pressure daily - write blood pressure results in a log or diary  -Encouraged patient to continue to eat a low salt and heart healthy diet -Encouraged patient to continue to exercise at the [facility] times weekly -Discussed going outside to do yard work, garden, and enjoy nature.   Why is this important?   You won't feel high blood pressure, but it can still hurt your blood vessels.  High blood pressure can cause heart or kidney problems. It can also cause a stroke.  Making lifestyle changes like losing a little weight or eating less salt will help.  Checking your blood pressure at home and at different times of the day can help to control blood pressure.  If the doctor prescribes  medicine remember to take it the way the doctor ordered.  Call the office if you cannot afford the medicine or if there are questions about it.     Notes: Patient continues to take her B/P daily and records values. She exercises at the [facility] and is eating a heart healthy, low salt diet. Updated: 11/04/20 Updated 12/05/20: Patient reports continuing to go to the [facility] to exercise and socialize twice weekly. Patient takes her B/P daily and records the values. She limits the amount of salt she eats in her diet and several servings of fruits and vegetables daily.   Updated 01/16/21: Patient continues to monitor her B/P daily and record the values. She goes to the [facility] twice weekly to exercise and socialize. Patient states she eats a lot of fruits and vegetables daily and is adhering to a low sodium diet. Patient looks forward to spending time outside doing yard work and gardening now that the weather is getting warmer.      [facility] Patient will verbalize that she is enjoying sewing within the next 90 days   On track    Timeframe:  Long-Range Goal Priority:  Medium Start Date:  12/05/20                           Expected End Date: 05/01/21 Follow Up Date: 05/01/21                    Patient wants to start sewing again because she enjoys sewing and it is relaxing.  She states she is going to check out her sewing machine to see if it still works. If it does not her daughter is going to buy her a new sewing machine. Patient wants to begin to sew some flannel pajamas.   Updated 01/16/21: Patient states that her sewing machine does work and she bought some material that she loves. She plans to start to sew an outfit soon.      Plan: RN Health Coach will call patient within the month of April. Follow-up:  Patient agrees to Care Plan and Follow-up.   Emelia Loron RN, BSN [place] [phone] [email or web address]

## 2021-02-01 ENCOUNTER — Other Ambulatory Visit: Payer: Self-pay | Admitting: Medical

## 2021-02-01 DIAGNOSIS — E782 Mixed hyperlipidemia: Secondary | ICD-10-CM

## 2021-02-03 NOTE — Telephone Encounter (Signed)
Is this ok to refill, reply to [facility]

## 2021-02-03 NOTE — Telephone Encounter (Signed)
schedule fasting med check/medicare well or med check if not due for medicare well.  Refill #30 day supply  Last labs 1 year ago

## 2021-02-04 ENCOUNTER — Other Ambulatory Visit: Payer: Self-pay | Admitting: Medical

## 2021-02-04 DIAGNOSIS — E782 Mixed hyperlipidemia: Secondary | ICD-10-CM

## 2021-02-13 ENCOUNTER — Other Ambulatory Visit: Payer: Self-pay | Admitting: *Deleted

## 2021-02-13 NOTE — Patient Instructions (Addendum)
Goals Addressed            This Visit's Progress   . [facility] Patient will verbalize maintaining a B/P below 150/90 for the next 90 days       Timeframe:  Long-Range Goal Priority:  High Start Date: 08/08/20                            Expected End Date: 10/01/21                     Follow Up Date 05/31/21   - check blood pressure daily - write blood pressure results in a log or diary  -Encouraged patient to continue to eat a low salt and heart healthy diet -Encouraged patient to continue to exercise at the [facility] times weekly -Discussed going outside to do yard work, garden, and enjoy nature.   Why is this important?   You won't feel high blood pressure, but it can still hurt your blood vessels.  High blood pressure can cause heart or kidney problems. It can also cause a stroke.  Making lifestyle changes like losing a little weight or eating less salt will help.  Checking your blood pressure at home and at different times of the day can help to control blood pressure.  If the doctor prescribes medicine remember to take it the way the doctor ordered.  Call the office if you cannot afford the medicine or if there are questions about it.     Notes: Patient continues to take her B/P daily and records values. She exercises at the [facility] and is eating a heart healthy, low salt diet. Updated: 11/04/20 Updated 12/05/20: Patient reports continuing to go to the [facility] to exercise and socialize twice weekly. Patient takes her B/P daily and records the values. She limits the amount of salt she eats in her diet and several servings of fruits and vegetables daily.   Updated 01/16/21: Patient continues to monitor her B/P daily and record the values. She goes to the [facility] twice weekly to exercise and socialize. Patient states she eats a lot of fruits and vegetables daily and is adhering to a low sodium diet. Patient looks forward to spending time outside doing yard work and gardening  now that the weather is getting warmer.   Update 02/13/21: Patient continues to do an excellent job with controlling her B/P and her overall health. Patient goes to the [facility] twice weekly to exercise and she eats a low sodium, heart healthy diet, full of vegetables and fruits. Patient reports that she has bought plants to start her vegetable garden.     [facility] Patient will verbalize that she is enjoying sewing within the next 90 days       Timeframe:  Long-Range Goal Priority:  Medium Start Date:  12/05/20                           Expected End Date: 05/31/21 Follow Up Date: 05/01/21                    Patient wants to start sewing again because she enjoys sewing and it is relaxing.  She states she is going to check out her sewing machine to see if it still works. If it does not her daughter is going to buy her a new sewing machine. Patient wants  to begin to sew some flannel pajamas.   Updated 01/16/21: Patient states that her sewing machine does work and she bought some material that she loves. She plans to start to sew an outfit soon.

## 2021-02-13 NOTE — Patient Outreach (Signed)
[facility]) Care Management  [place]  02/13/2021   Nancy Castro 1951/07/11 [id number]  Subjective:  Successful telephone outreach call to patient. HIPAA identifiers obtained. Patient reports she is doing very well. Her B/P continues to be well controlled. She monitors her B/P daily and does record the values in a log. Patient goes to the [facility] twice weekly to exercise and socialize. She continues to eat  a low sodium and heart healthy diet. Patient reports eating a lot of fruits and vegetables and she makes healthy smoothies. She enjoys gardening and will soon plant her vegetable garden. Patient did not have any further questions or concerns today and did confirm that she has this nurse's contact number to call her if needed.   Encounter Medications:  Outpatient Encounter Medications as of 02/13/2021  Medication Sig Note  . acetaminophen-codeine (TYLENOL #3) 300-30 MG tablet Take 1 tablet by mouth at bedtime as needed for moderate pain.   Marland Kitchen atorvastatin (LIPITOR) 40 MG tablet TAKE 1 TABLET(40 MG) BY MOUTH DAILY   . cholecalciferol (VITAMIN D3) 25 MCG (1000 UNIT) tablet Take 1 tablet (1,000 Units total) by mouth daily.   Marland Kitchen losartan (COZAAR) 50 MG tablet Take 1 tablet (50 mg total) by mouth daily.   . MULTIPLE VITAMIN PO Take 1 tablet by mouth daily.  01/07/2020: Pt daughter verified medication.  . niacin 250 MG tablet Take 2 tablets (500 mg total) by mouth at bedtime. 2 tablets OTC daily    No facility-administered encounter medications on file as of 02/13/2021.    Functional Status:  In your present state of health, do you have any difficulty performing the following activities: 02/21/2020 02/20/2020  Hearing? N N  Vision? N N  Difficulty concentrating or making decisions? N Y  Comment - has had episoeds of mild cognitive impairment, daughter and patient states, she is independent with ADLs and IADLs  Walking or climbing stairs? Y N  Dressing or bathing? N  N  Doing errands, shopping? N N  Preparing Food and eating ? N N  Using the Toilet? N N  In the past six months, have you accidently leaked urine? N N  Do you have problems with loss of bowel control? N N  Managing your Medications? N N  Managing your Finances? N N  Housekeeping or managing your Housekeeping? N N  Some recent data might be hidden    Fall/Depression Screening: Fall Risk  02/13/2021 01/16/2021 12/05/2020  Falls in the past year? 0 0 0  Number falls in past yr: 0 0 0  Injury with Fall? 0 0 0  Follow up Falls prevention discussed;Education provided;Falls evaluation completed Falls prevention discussed;Education provided;Falls evaluation completed -   PHQ 2/9 Scores 08/08/2020 02/21/2020 02/20/2020 12/13/2018 08/25/2017 02/05/2016 08/07/2015  PHQ - 2 Score 0 0 2 0 0 0 0  PHQ- 9 Score - 0 6 - - - -    Assessment:  Goals Addressed            This Visit's Progress   . [facility] Patient will verbalize maintaining a B/P below 150/90 for the next 90 days       Timeframe:  Long-Range Goal Priority:  High Start Date: 08/08/20                            Expected End Date: 10/01/21  Follow Up Date 05/31/21   - check blood pressure daily - write blood pressure results in a log or diary  -Encouraged patient to continue to eat a low salt and heart healthy diet -Encouraged patient to continue to exercise at the [facility] times weekly -Discussed going outside to do yard work, garden, and enjoy nature.   Why is this important?   You won't feel high blood pressure, but it can still hurt your blood vessels.  High blood pressure can cause heart or kidney problems. It can also cause a stroke.  Making lifestyle changes like losing a little weight or eating less salt will help.  Checking your blood pressure at home and at different times of the day can help to control blood pressure.  If the doctor prescribes medicine remember to take it the way the doctor ordered.  Call  the office if you cannot afford the medicine or if there are questions about it.     Notes: Patient continues to take her B/P daily and records values. She exercises at the [facility] and is eating a heart healthy, low salt diet. Updated: 11/04/20 Updated 12/05/20: Patient reports continuing to go to the [facility] to exercise and socialize twice weekly. Patient takes her B/P daily and records the values. She limits the amount of salt she eats in her diet and several servings of fruits and vegetables daily.   Updated 01/16/21: Patient continues to monitor her B/P daily and record the values. She goes to the [facility] twice weekly to exercise and socialize. Patient states she eats a lot of fruits and vegetables daily and is adhering to a low sodium diet. Patient looks forward to spending time outside doing yard work and gardening now that the weather is getting warmer.   Update 02/13/21: Patient continues to do an excellent job with controlling her B/P and her overall health. Patient goes to the [facility] twice weekly to exercise and she eats a low sodium, heart healthy diet, full of vegetables and fruits. Patient reports that she has bought plants to start her vegetable garden.     [facility] Patient will verbalize that she is enjoying sewing within the next 90 days       Timeframe:  Long-Range Goal Priority:  Medium Start Date:  12/05/20                           Expected End Date: 05/31/21 Follow Up Date: 05/01/21                    Patient wants to start sewing again because she enjoys sewing and it is relaxing.  She states she is going to check out her sewing machine to see if it still works. If it does not her daughter is going to buy her a new sewing machine. Patient wants to begin to sew some flannel pajamas.   Updated 01/16/21: Patient states that her sewing machine does work and she bought some material that she loves. She plans to start to sew an outfit soon.      Plan: RN Health Coach  will send patient [employer] coupons and will call patient within the month of May. Follow-up:  Patient agrees to Care Plan and Follow-up.  Emelia Loron RN, BSN [place] [phone] [email or web address]

## 2021-03-03 LAB — HM MAMMOGRAPHY

## 2021-03-05 ENCOUNTER — Telehealth: Payer: Self-pay | Admitting: Medical

## 2021-03-05 ENCOUNTER — Encounter: Payer: Self-pay | Admitting: *Deleted

## 2021-03-05 NOTE — Telephone Encounter (Signed)
Mammogram shows area of concern and other imaging recommended.  She should be getting a call back.  If not heard back within 1 week, call us back.

## 2021-03-05 NOTE — Telephone Encounter (Signed)
Left message advising patient

## 2021-03-05 NOTE — Telephone Encounter (Signed)
Pt called to advise she has a mammogram appt with [facility] on 04/14/2021 at 10:00

## 2021-03-06 ENCOUNTER — Encounter: Payer: Self-pay | Admitting: Medical

## 2021-03-17 ENCOUNTER — Other Ambulatory Visit: Payer: Self-pay | Admitting: Medical

## 2021-03-17 DIAGNOSIS — E782 Mixed hyperlipidemia: Secondary | ICD-10-CM

## 2021-03-21 ENCOUNTER — Other Ambulatory Visit: Payer: Self-pay | Admitting: *Deleted

## 2021-03-21 NOTE — Patient Instructions (Addendum)
Goals Addressed            This Visit's Progress   . [facility] Patient will verbalize maintaining a B/P below 150/90 for the next 90 days       Timeframe:  Long-Range Goal Priority:  High Start Date: 08/08/20                            Expected End Date: 10/01/21                     Follow Up Date 05/31/21   - check blood pressure daily - write blood pressure results in a log or diary  -Encouraged patient to continue to eat a low salt and heart healthy diet -Encouraged patient to continue to exercise at the [facility] times weekly -Discussed going outside to do yard work, garden, and enjoy nature.   Why is this important?   You won't feel high blood pressure, but it can still hurt your blood vessels.  High blood pressure can cause heart or kidney problems. It can also cause a stroke.  Making lifestyle changes like losing a little weight or eating less salt will help.  Checking your blood pressure at home and at different times of the day can help to control blood pressure.  If the doctor prescribes medicine remember to take it the way the doctor ordered.  Call the office if you cannot afford the medicine or if there are questions about it.     Notes: Patient continues to take her B/P daily and records values. She exercises at the [facility] and is eating a heart healthy, low salt diet. Updated: 11/04/20 Updated 12/05/20: Patient reports continuing to go to the [facility] to exercise and socialize twice weekly. Patient takes her B/P daily and records the values. She limits the amount of salt she eats in her diet and several servings of fruits and vegetables daily.   Updated 01/16/21: Patient continues to monitor her B/P daily and record the values. She goes to the [facility] twice weekly to exercise and socialize. Patient states she eats a lot of fruits and vegetables daily and is adhering to a low sodium diet. Patient looks forward to spending time outside doing yard work and gardening  now that the weather is getting warmer.   Update 02/13/21: Patient continues to do an excellent job with controlling her B/P and her overall health. Patient goes to the [facility] twice weekly to exercise and she eats a low sodium, heart healthy diet, full of vegetables and fruits. Patient reports that she has bought plants to start her vegetable garden.   Updated 03/21/21: Patient reports that her B/P continues to be well controlled. She explains that she is feeling good, continues to eat healthy, and stays physically active by gardening, mowing her yard, and going to the [facility] for classes.    [facility] Patient will verbalize that she is enjoying sewing within the next 90 days       Timeframe:  Long-Range Goal Priority:  Medium Start Date:  12/05/20                           Expected End Date: 05/31/21 Follow Up Date: 05/01/21                    Patient wants to start sewing again because she  enjoys sewing and it is relaxing.  She states she is going to check out her sewing machine to see if it still works. If it does not her daughter is going to buy her a new sewing machine. Patient wants to begin to sew some flannel pajamas.   Updated 01/16/21: Patient states that her sewing machine does work and she bought some material that she loves. She plans to start to sew an outfit soon.  Updated 03/21/21: Patient states that she has been busy doing yard work and other activities. She does have all that she needs to start sewing and plans to begin soon.

## 2021-03-21 NOTE — Patient Outreach (Signed)
[facility]) Care Management  [place]  03/21/2021   Nancy Castro 1951/09/30 [id number]  Subjective: Successful telephone outreach call to patient. HIPAA identifiers obtained. Successful telephone outreach call to patient. HIPAA identifiers obtained.Patient reports she is doing very well. Her B/P continues to be well controlled. She monitors her B/P daily and does record the values in a log. Today's value was 127/70. Patient goes to the [facility] twice weekly to exercise and socialize. She continues to eat a low sodium and heart healthy diet. Patient reports eating a lot of fruits and vegetables and she makes healthy smoothies. She is currently mowing her yard and tending to her vegetable garden. Patient did not have any further questions or concerns today and did confirm thatshehas this nurse's contact number to call her if needed.  Encounter Medications:  Outpatient Encounter Medications as of 03/21/2021  Medication Sig Note  . acetaminophen-codeine (TYLENOL #3) 300-30 MG tablet Take 1 tablet by mouth at bedtime as needed for moderate pain.   Marland Kitchen atorvastatin (LIPITOR) 40 MG tablet TAKE 1 TABLET(40 MG) BY MOUTH DAILY   . cholecalciferol (VITAMIN D3) 25 MCG (1000 UNIT) tablet Take 1 tablet (1,000 Units total) by mouth daily.   Marland Kitchen losartan (COZAAR) 50 MG tablet Take 1 tablet (50 mg total) by mouth daily.   . MULTIPLE VITAMIN PO Take 1 tablet by mouth daily.  01/07/2020: Pt daughter verified medication.  . niacin 250 MG tablet Take 2 tablets (500 mg total) by mouth at bedtime. 2 tablets OTC daily    No facility-administered encounter medications on file as of 03/21/2021.    Functional Status:  No flowsheet data found.  Fall/Depression Screening: Fall Risk  03/21/2021 02/13/2021 01/16/2021  Falls in the past year? 0 0 0  Number falls in past yr: 0 0 0  Injury with Fall? 0 0 0  Follow up Falls prevention discussed;Education provided;Falls evaluation completed Falls  prevention discussed;Education provided;Falls evaluation completed Falls prevention discussed;Education provided;Falls evaluation completed   PHQ 2/9 Scores 08/08/2020 02/21/2020 02/20/2020 12/13/2018 08/25/2017 02/05/2016 08/07/2015  PHQ - 2 Score 0 0 2 0 0 0 0  PHQ- 9 Score - 0 6 - - - -    Assessment:  Goals Addressed            This Visit's Progress   . [facility] Patient will verbalize maintaining a B/P below 150/90 for the next 90 days       Timeframe:  Long-Range Goal Priority:  High Start Date: 08/08/20                            Expected End Date: 10/01/21                     Follow Up Date 05/31/21   - check blood pressure daily - write blood pressure results in a log or diary  -Encouraged patient to continue to eat a low salt and heart healthy diet -Encouraged patient to continue to exercise at the [facility] times weekly -Discussed going outside to do yard work, garden, and enjoy nature.   Why is this important?   You won't feel high blood pressure, but it can still hurt your blood vessels.  High blood pressure can cause heart or kidney problems. It can also cause a stroke.  Making lifestyle changes like losing a little weight or eating less salt will help.  Checking your blood pressure  at home and at different times of the day can help to control blood pressure.  If the doctor prescribes medicine remember to take it the way the doctor ordered.  Call the office if you cannot afford the medicine or if there are questions about it.     Notes: Patient continues to take her B/P daily and records values. She exercises at the [facility] and is eating a heart healthy, low salt diet. Updated: 11/04/20 Updated 12/05/20: Patient reports continuing to go to the [facility] to exercise and socialize twice weekly. Patient takes her B/P daily and records the values. She limits the amount of salt she eats in her diet and several servings of fruits and vegetables daily.   Updated 01/16/21:  Patient continues to monitor her B/P daily and record the values. She goes to the [facility] twice weekly to exercise and socialize. Patient states she eats a lot of fruits and vegetables daily and is adhering to a low sodium diet. Patient looks forward to spending time outside doing yard work and gardening now that the weather is getting warmer.   Update 02/13/21: Patient continues to do an excellent job with controlling her B/P and her overall health. Patient goes to the [facility] twice weekly to exercise and she eats a low sodium, heart healthy diet, full of vegetables and fruits. Patient reports that she has bought plants to start her vegetable garden.   Updated 03/21/21: Patient reports that her B/P continues to be well controlled. She explains that she is feeling good, continues to eat healthy, and stays physically active by gardening, mowing her yard, and going to the [facility] for classes.    [facility] Patient will verbalize that she is enjoying sewing within the next 90 days       Timeframe:  Long-Range Goal Priority:  Medium Start Date:  12/05/20                           Expected End Date: 05/31/21 Follow Up Date: 05/01/21                    Patient wants to start sewing again because she enjoys sewing and it is relaxing.  She states she is going to check out her sewing machine to see if it still works. If it does not her daughter is going to buy her a new sewing machine. Patient wants to begin to sew some flannel pajamas.   Updated 01/16/21: Patient states that her sewing machine does work and she bought some material that she loves. She plans to start to sew an outfit soon.  Updated 03/21/21: Patient states that she has been busy doing yard work and other activities. She does have all that she needs to start sewing and plans to begin soon.      Plan: RN Health Coach will send PCP a quarterly update and call patient within the month of June. Follow-up:  Patient agrees to Care Plan and  Follow-up.  Emelia Loron RN, BSN [place] [phone] [email or web address]

## 2021-04-14 ENCOUNTER — Telehealth: Payer: Self-pay | Admitting: Medical

## 2021-04-14 LAB — HM MAMMOGRAPHY

## 2021-04-14 NOTE — Telephone Encounter (Signed)
Mammogram shows area of concern and other imaging recommended.  She should be getting a call back.  If not heard back within 1 week, call us back.

## 2021-04-15 NOTE — Telephone Encounter (Signed)
Patient she has already been contacted about this result and has scheduled her appointment.

## 2021-04-18 ENCOUNTER — Other Ambulatory Visit: Payer: Self-pay | Admitting: *Deleted

## 2021-04-18 ENCOUNTER — Encounter: Payer: Self-pay | Admitting: Medical

## 2021-04-18 NOTE — Patient Instructions (Signed)
Goals Addressed             This Visit's Progress    THN Patient will verbalize maintaining a B/P below 150/90 for the next 90 days       Timeframe:  Long-Range Goal Priority:  High Start Date: 08/08/20                            Expected End Date: 10/01/21                     Follow Up Date 08/01/21   - check blood pressure daily - write blood pressure results in a log or diary  -Encouraged patient to continue to eat a low salt and heart healthy diet -Encouraged patient to continue to exercise at the [place] [facility] 2 times weekly -Discussed going outside to do yard work, garden, and enjoy nature.   Why is this important?   You won't feel high blood pressure, but it can still hurt your blood vessels.  High blood pressure can cause heart or kidney problems. It can also cause a stroke.  Making lifestyle changes like losing a little weight or eating less salt will help.  Checking your blood pressure at home and at different times of the day can help to control blood pressure.  If the doctor prescribes medicine remember to take it the way the doctor ordered.  Call the office if you cannot afford the medicine or if there are questions about it.     Notes: Patient continues to take her B/P daily and records values. She exercises at the [facility] and is eating a heart healthy, low salt diet. Updated: 11/04/20 Updated 12/05/20: Patient reports continuing to go to the [facility] to exercise and socialize twice weekly. Patient takes her B/P daily and records the values. She limits the amount of salt she eats in her diet and several servings of fruits and vegetables daily.   Updated 01/16/21: Patient continues to monitor her B/P daily and record the values. She goes to the [facility] twice weekly to exercise and socialize. Patient states she eats a lot of fruits and vegetables daily and is adhering to a low sodium diet. Patient looks forward to spending time outside doing yard work and gardening  now that the weather is getting warmer.   Update 02/13/21: Patient continues to do an excellent job with controlling her B/P and her overall health. Patient goes to the [facility] twice weekly to exercise and she eats a low sodium, heart healthy diet, full of vegetables and fruits. Patient reports that she has bought plants to start her vegetable garden.   Updated 03/21/21: Patient reports that her B/P continues to be well controlled. She explains that she is feeling good, continues to eat healthy, and stays physically active by gardening, mowing her yard, and going to the [facility] for classes.  Updated 04/18/21: Patient continues to report that her hypertension is well controlled. She continues to eat healthy, and stays physically active by gardening, mowing her yard, and going to the [facility] for classes.      THN Patient will verbalize that she is enjoying sewing within the next 90 days       Timeframe:  Long-Range Goal Priority:  Medium Start Date:  12/05/20  Expected End Date: 05/31/21 Follow Up Date: 08/01/21                    Patient wants to start sewing again because she enjoys sewing and it is relaxing.  She states she is going to check out her sewing machine to see if it still works. If it does not her daughter is going to buy her a new sewing machine. Patient wants to begin to sew some flannel pajamas.   Updated 01/16/21: Patient states that her sewing machine does work and she bought some material that she loves. She plans to start to sew an outfit soon.  Updated 03/21/21: Patient states that she has been busy doing yard work and other activities. She does have all that she needs to start sewing and plans to begin soon.  Updated 04/18/21: Patient reports being busy with her daily life and garden but hope to start sewing soon.

## 2021-04-18 NOTE — Patient Outreach (Signed)
[facility]) Care Management  [place]  04/18/2021   PATSY ZARAGOZA [birth year] [id number]  Subjective: Successful telephone outreach call to patient. HIPAA identifiers obtained. Patient reports that her mammogram screening showed a suspicious area and that she is schedule to have a  breast biopsy to follow-up. Nurse gave patient emotional support and provided education.  Patient's B/P continues to be well controlled. She monitors her B/P daily and does record the values in a log. Today's value was 129/70 and pulse 80. Patient goes to the [facility] twice weekly to exercise and socialize. She continues to eat a low sodium and heart healthy diet. She is currently mowing her yard and tending to her vegetable garden. Patient did not have any further questions or concerns today and did confirm that she has this nurse's contact number to call her if needed.  Encounter Medications:  Outpatient Encounter Medications as of 04/18/2021  Medication Sig   acetaminophen-codeine (TYLENOL #3) 300-30 MG tablet Take 1 tablet by mouth at bedtime as needed for moderate pain.   atorvastatin (LIPITOR) 40 MG tablet TAKE 1 TABLET(40 MG) BY MOUTH DAILY   cholecalciferol (VITAMIN D3) 25 MCG (1000 UNIT) tablet Take 1 tablet (1,000 Units total) by mouth daily.   losartan (COZAAR) 50 MG tablet Take 1 tablet (50 mg total) by mouth daily.   MULTIPLE VITAMIN PO Take 1 tablet by mouth daily.    niacin 250 MG tablet Take 2 tablets (500 mg total) by mouth at bedtime. 2 tablets OTC daily   No facility-administered encounter medications on file as of 04/18/2021.    Functional Status:  No flowsheet data found.  Fall/Depression Screening: Fall Risk  04/18/2021 03/21/2021 02/13/2021  Falls in the past year? 0 0 0  Number falls in past yr: 0 0 0  Injury with Fall? 0 0 0  Follow up - Falls prevention discussed;Education provided;Falls evaluation completed Falls prevention discussed;Education  provided;Falls evaluation completed   PHQ 2/9 Scores 08/08/2020 02/21/2020 02/20/2020 12/13/2018 08/25/2017 02/05/2016 08/07/2015  PHQ - 2 Score 0 0 2 0 0 0 0  PHQ- 9 Score - 0 6 - - - -    Assessment:   Care Plan There are no care plans that you recently modified to display for this patient.    Goals Addressed             This Visit's Progress    THN Patient will verbalize maintaining a B/P below 150/90 for the next 90 days       Timeframe:  Long-Range Goal Priority:  High Start Date: 08/08/20                            Expected End Date: 10/01/21                     Follow Up Date 08/01/21   - check blood pressure daily - write blood pressure results in a log or diary  -Encouraged patient to continue to eat a low salt and heart healthy diet -Encouraged patient to continue to exercise at the [place] [facility] times weekly -Discussed going outside to do yard work, garden, and enjoy nature.   Why is this important?   You won't feel high blood pressure, but it can still hurt your blood vessels.  High blood pressure can cause heart or kidney problems. It can also cause a stroke.  Making lifestyle changes like losing a  little weight or eating less salt will help.  Checking your blood pressure at home and at different times of the day can help to control blood pressure.  If the doctor prescribes medicine remember to take it the way the doctor ordered.  Call the office if you cannot afford the medicine or if there are questions about it.     Notes: Patient continues to take her B/P daily and records values. She exercises at the [facility] and is eating a heart healthy, low salt diet. Updated: 11/04/20 Updated 12/05/20: Patient reports continuing to go to the [facility] to exercise and socialize twice weekly. Patient takes her B/P daily and records the values. She limits the amount of salt she eats in her diet and several servings of fruits and vegetables daily.   Updated 01/16/21: Patient  continues to monitor her B/P daily and record the values. She goes to the [facility] twice weekly to exercise and socialize. Patient states she eats a lot of fruits and vegetables daily and is adhering to a low sodium diet. Patient looks forward to spending time outside doing yard work and gardening now that the weather is getting warmer.   Update 02/13/21: Patient continues to do an excellent job with controlling her B/P and her overall health. Patient goes to the [facility] twice weekly to exercise and she eats a low sodium, heart healthy diet, full of vegetables and fruits. Patient reports that she has bought plants to start her vegetable garden.   Updated 03/21/21: Patient reports that her B/P continues to be well controlled. She explains that she is feeling good, continues to eat healthy, and stays physically active by gardening, mowing her yard, and going to the [facility] for classes.  Updated 04/18/21: Patient continues to report that her hypertension is well controlled. She continues to eat healthy, and stays physically active by gardening, mowing her yard, and going to the [facility] for classes.      THN Patient will verbalize that she is enjoying sewing within the next 90 days       Timeframe:  Long-Range Goal Priority:  Medium Start Date:  12/05/20                           Expected End Date: 05/31/21 Follow Up Date: 08/01/21                    Patient wants to start sewing again because she enjoys sewing and it is relaxing.  She states she is going to check out her sewing machine to see if it still works. If it does not her daughter is going to buy her a new sewing machine. Patient wants to begin to sew some flannel pajamas.   Updated 01/16/21: Patient states that her sewing machine does work and she bought some material that she loves. She plans to start to sew an outfit soon.  Updated 03/21/21: Patient states that she has been busy doing yard work and other activities. She does  have all that she needs to start sewing and plans to begin soon.  Updated 04/18/21: Patient reports being busy with her daily life and garden but hope to start sewing soon.        Plan: RN Health Coach will call patient within the month of August. Follow-up: Patient agrees to Care Plan and Follow-up.   Emelia Loron RN, BSN [place] Management  RN Health Coach  [phone] [email or web address]

## 2021-05-01 ENCOUNTER — Other Ambulatory Visit: Payer: Self-pay | Admitting: Medical

## 2021-05-01 DIAGNOSIS — E782 Mixed hyperlipidemia: Secondary | ICD-10-CM

## 2021-05-01 DIAGNOSIS — I10 Essential (primary) hypertension: Secondary | ICD-10-CM

## 2021-05-15 ENCOUNTER — Telehealth: Payer: Self-pay | Admitting: Medical

## 2021-05-16 NOTE — Telephone Encounter (Signed)
Nothing is on this message.

## 2021-06-09 ENCOUNTER — Ambulatory Visit (INDEPENDENT_AMBULATORY_CARE_PROVIDER_SITE_OTHER): Payer: Medicare Other | Admitting: Medical

## 2021-06-09 ENCOUNTER — Other Ambulatory Visit: Payer: Self-pay

## 2021-06-09 ENCOUNTER — Encounter: Payer: Self-pay | Admitting: Medical

## 2021-06-09 VITALS — BP 120/78 | HR 64 | Ht 61.5 in | Wt 137.4 lb

## 2021-06-09 DIAGNOSIS — Z9181 History of falling: Secondary | ICD-10-CM

## 2021-06-09 DIAGNOSIS — Z0000 Encounter for general adult medical examination without abnormal findings: Secondary | ICD-10-CM | POA: Diagnosis not present

## 2021-06-09 DIAGNOSIS — Z7185 Encounter for immunization safety counseling: Secondary | ICD-10-CM

## 2021-06-09 DIAGNOSIS — M8949 Other hypertrophic osteoarthropathy, multiple sites: Secondary | ICD-10-CM

## 2021-06-09 DIAGNOSIS — I10 Essential (primary) hypertension: Secondary | ICD-10-CM

## 2021-06-09 DIAGNOSIS — M159 Polyosteoarthritis, unspecified: Secondary | ICD-10-CM

## 2021-06-09 DIAGNOSIS — K635 Polyp of colon: Secondary | ICD-10-CM

## 2021-06-09 DIAGNOSIS — R7301 Impaired fasting glucose: Secondary | ICD-10-CM | POA: Insufficient documentation

## 2021-06-09 DIAGNOSIS — E2839 Other primary ovarian failure: Secondary | ICD-10-CM

## 2021-06-09 DIAGNOSIS — Z780 Asymptomatic menopausal state: Secondary | ICD-10-CM | POA: Diagnosis not present

## 2021-06-09 DIAGNOSIS — E782 Mixed hyperlipidemia: Secondary | ICD-10-CM

## 2021-06-09 DIAGNOSIS — R2681 Unsteadiness on feet: Secondary | ICD-10-CM

## 2021-06-09 DIAGNOSIS — E559 Vitamin D deficiency, unspecified: Secondary | ICD-10-CM

## 2021-06-09 DIAGNOSIS — Z23 Encounter for immunization: Secondary | ICD-10-CM | POA: Diagnosis not present

## 2021-06-09 DIAGNOSIS — M150 Primary generalized (osteo)arthritis: Secondary | ICD-10-CM

## 2021-06-09 DIAGNOSIS — G3184 Mild cognitive impairment, so stated: Secondary | ICD-10-CM

## 2021-06-09 NOTE — Progress Notes (Signed)
Subjective:    Nancy Castro is a 70 y.o. female who presents for Preventative Services visit and chronic medical problems/med check visit.    Primary Care Provider Nancy Castro, Nancy Eng, PA-C here for primary care  Current Health Care Team: Dentist, sees one but can't remember the name. [facility], Nancy Castro crafters Dr. Arlice Castro, neurology Dr. Jenkins Castro, cardiology  Medical Services you may have received from other than [facility] providers in the past year (date may be approximate) None, [facility] community care management  Exercise Current exercise habits: goes to the [facility]- [facility] [place] [facility]  Nutrition/Diet Current diet: in general, a "healthy" diet    Depression Screen Depression screen [facility] 2/9 06/09/2021  Decreased Interest 0  Down, Depressed, Hopeless 0  PHQ - 2 Score 0  Altered sleeping -  Tired, decreased energy -  Change in appetite -  Feeling bad or failure about yourself  -  Trouble concentrating -  Moving slowly or fidgety/restless -  Suicidal thoughts -  PHQ-9 Score -  Difficult doing work/chores -    Activities of Daily Living Screen/Functional Status Survey Is the patient deaf or have difficulty hearing?: No Does the patient have difficulty seeing, even when wearing glasses/contacts?: No Does the patient have difficulty concentrating, remembering, or making decisions?: No Does the patient have difficulty walking or climbing stairs?: No Does the patient have difficulty dressing or bathing?: No Does the patient have difficulty doing errands alone such as visiting a doctor's office or shopping?: No  Can patient draw a clock face showing 3:15 oclock,  Yes   Fall Risk Screen Fall Risk  06/09/2021 04/18/2021 03/21/2021 02/13/2021 01/16/2021  Falls in the past year? 0 0 0 0 0  Number falls in past yr: 0 0 0 0 0  Injury with Fall? 0 0 0 0 0  Risk for fall due to : No Fall Risks - - - -  Follow up Falls evaluation completed - Falls prevention  discussed;Education provided;Falls evaluation completed Falls prevention discussed;Education provided;Falls evaluation completed Falls prevention discussed;Education provided;Falls evaluation completed    Gait Assessment: Normal gait observed yes  Advanced directives Does patient have a [place]? No Does patient have a Living Will? Yes   Past Medical History:  Diagnosis Date   DDD (degenerative disc disease), cervical    H/O echocardiogram 2007   History of exercise stress test 2007   Hyperlipidemia    Hypertension    Mixed dyslipidemia    Osteoarthritis    Syncope 2012   cardiac stress test    Wears glasses    Wears partial dentures    upper and lower    Past Surgical History:  Procedure Laterality Date   COLONOSCOPY     2014, polyps, repeat 3 years   COLONOSCOPY  07/14/2018   tubular adenoma polyps, repeat 3 years, Dr. Ulice Dash Castro   KNEE ARTHROSCOPY     right   LIPOMA EXCISION     back x 2   PARTIAL HYSTERECTOMY     fibroids; has both ovaries    Social History   Socioeconomic History   Marital status: Divorced    Spouse name: Not on file   Number of children: 2   Years of education: 11   Highest education level: Not on file  Occupational History   Occupation: Retired  Tobacco Use   Smoking status: Former    Packs/day: 0.25    Years: 5.00    Pack years: 1.25  Types: Cigarettes    Quit date: 02/08/1987    Years since quitting: 34.3   Smokeless tobacco: Former    Types: Chew   Tobacco comments:    Stopped chewing years  ago.  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   No grandchildren.   Sews - hobby.  06/2021   Right handed   1 cup per day of caffeine use:    Sister Nancy [place] in [place] checks in on her regularly as well as her daughter Nancy Castro   Social Determinants of Health   Financial Resource Strain: Not on [employer] Insecurity: Not on file  Transportation Needs: Not on file  Physical Activity: Not on file  Stress: Not on file  Social Connections: Not on file  Intimate Partner Violence: Not on file    Family History  Problem Relation Age of Onset   Cancer Mother        died of breast cancer   Cancer Father        father lung cancer   Cancer Sister        lung, mets to brain   Cancer Brother        lung and stomach   Stomach cancer Brother        died at age 78   Hypertension Maternal Grandmother    Cancer Brother        lung   Diabetes Brother    Heart disease Neg Hx    Stroke Neg Hx    Colon cancer Neg Hx    Rectal cancer Neg Hx      Current Outpatient Medications:    acetaminophen-codeine (TYLENOL #3) 300-30 MG tablet, Take 1 tablet by mouth at bedtime as needed for moderate pain., Disp: 30 tablet, Rfl: 0   atorvastatin (LIPITOR) 40 MG tablet, TAKE 1 TABLET(40 MG) BY MOUTH DAILY, Disp: 90 tablet, Rfl: 1   cholecalciferol (VITAMIN D3) 25 MCG (1000 UNIT) tablet, Take 1 tablet (1,000 Units total) by mouth daily., Disp: 90 tablet, Rfl: 3   losartan (COZAAR) 50 MG tablet, TAKE 1 TABLET(50 MG) BY MOUTH DAILY, Disp: 90 tablet, Rfl: 1   MULTIPLE VITAMIN PO, Take 1 tablet by mouth daily. , Disp: , Rfl:    niacin 250 MG tablet, Take 2 tablets (500 mg total) by mouth at bedtime. 2 tablets OTC daily, Disp: 180 tablet, Rfl: 3  No Known Allergies  History reviewed: allergies, current medications, past family history, past medical history, past social history, past surgical history and problem list  Chronic issues discussed: HTN - compliant with losartan '50mg'$  daily  Hyperlipidemia - compliant with Lipitor '40mg'$  without complaint,  also takes Niacin daily  Vit D deficiency - compliant with Vit D 1000u.  Acute issues discussed: None in particular.  Lives alone .  Drives regularly, still cooks regularly.  Donates blood at [facility].       Objective:     Biometrics BP 120/78   Pulse 64   Ht 5' 1.5" (1.562 m)   Wt 137 lb 6.4 oz (62.3 kg)  BMI 25.54 kg/m   Wt Readings from Last 3 Encounters:  06/09/21 137 lb 6.4 oz (62.3 kg)  08/22/20 134 lb 12.8 oz (61.1 kg)  06/20/20 138 lb 3.2 oz (62.7 kg)     Cognitive Testing  Alert? Yes  Normal Appearance?Yes  Oriented to person? Yes  Place? Yes   Time? Yes  Recall of three objects?  2/3  Can perform simple calculations? Yes  Displays appropriate judgment?Yes  Can read the correct time from a watch face?Yes  General appearance: alert, no distress, WD/WN, African American female  Nutritional Status: Inadequate calore intake? no Loss of muscle mass? no Loss of fat beneath skin? no Localized or general edema? no Diminished functional status? no  Other pertinent exam: Skin: unremarkable HEENT: normocephalic, sclerae anicteric, TMs pearly, nares patent, no discharge or erythema, pharynx normal Neck: supple, no lymphadenopathy, no thyromegaly, no masses Heart: RRR, normal S1, S2, no murmurs Lungs: CTA bilaterally, no wheezes, rhonchi, or rales Abdomen: +bs, soft, non tender, non distended, no masses, no hepatomegaly, no splenomegaly Musculoskeletal: nontender, no swelling, no obvious deformity Extremities: no edema, no cyanosis, no clubbing Pulses: 2+ symmetric, upper and lower extremities, normal cap refill Neurological: alert, oriented x 3, CN2-12 intact, strength normal upper extremities and lower extremities, sensation normal throughout, DTRs 2+ throughout, no cerebellar signs, gait normal Psychiatric: normal affect, behavior normal, pleasant  Breast/gyn/rectal - deferred    Assessment:   Encounter Diagnoses  Name Primary?   Routine general  medical examination at a health care facility Yes   Medicare annual wellness visit, subsequent    Need for pneumococcal vaccination    Essential hypertension    At high risk for falls    Polyp of colon, unspecified part of colon, unspecified type    Postmenopausal    Vaccine counseling    Vitamin D deficiency    Primary osteoarthritis involving multiple joints    Mixed dyslipidemia    Mild cognitive impairment    History of fall    Gait instability    Estrogen deficiency    Impaired fasting blood sugar      Plan:   A preventative services visit was completed today.  During the course of the visit today, we discussed and counseled about appropriate screening and preventive services.  A health risk assessment was established today that included a review of current medications, allergies, social history, family history, medical and preventative health history, biometrics, and preventative screenings to identify potential safety concerns or impairments.  This visit was a preventative care visit, also known as wellness visit or routine physical.   Topics typically include healthy lifestyle, diet, exercise, preventative care, vaccinations, sick and well care, proper use of emergency dept and after hours care, as well as other concerns.     Recommendations: Continue to return yearly for your annual wellness and preventative care visits.  This gives [place] a chance to discuss healthy lifestyle, exercise, vaccinations, review your chart record, and perform screenings where appropriate.  I recommend you see your eye doctor yearly for routine vision care.  I recommend you see your dentist yearly for routine dental care including hygiene visits twice yearly.   Vaccination recommendations were reviewed Immunization History  Administered Date(s) Administered   Influenza, High Dose Seasonal PF 08/07/2015, 08/20/2016, 08/16/2018, 07/12/2019, 07/09/2020   Influenza, Seasonal, Injecte, Preservative  Fre 10/10/2012   Influenza-Unspecified 09/12/2014, 07/30/2017, 08/16/2018   Moderna Sars-Covid-2 Vaccination 12/17/2019, 01/15/2020, 09/17/2020   Pneumococcal Conjugate-13 06/20/2020   Pneumococcal Polysaccharide-23 08/07/2015  Shingles vaccine:  I recommend you have a shingles vaccine to help prevent shingles or herpes zoster outbreak.   Please call your insurer to inquire about coverage for the Shingrix vaccine given in 2 doses.   Some insurers cover this vaccine after age 30, some cover this after age 76.  If your insurer covers this, then call to schedule appointment to have this vaccine here.  I also recommend you call insurance about coverage for Tetanus booster as well  I recommend a yearly flu shot in the fall  I recommend an updated pneumococcal 23 vaccine booster.  Last one was > 5 years ago  Counseled on the pneumococcal vaccine.  Vaccine information sheet given.  Pneumococcal vaccine PPSV23 given after consent obtained.    Screening for cancer: Colon cancer screening: I reviewed your colonoscopy on file that is up to date from 07/2018.  Breast cancer screening: You should perform a self breast exam monthly.   We reviewed recommendations for regular mammograms and breast cancer screening.  Cervical cancer screening: You no longer need pap smears.  USPSTF recommends screening up to age 15   Skin cancer screening: Check your skin regularly for new changes, growing lesions, or other lesions of concern Come in for evaluation if you have skin lesions of concern.  Lung cancer screening: If you have a greater than 20 pack year history of tobacco use, then you may qualify for lung cancer screening with a chest CT scan.   Please call your insurance company to inquire about coverage for this test.  We currently don't have screenings for other cancers besides breast, cervical, colon, and lung cancers.  If you have a strong family history of cancer or have other cancer screening  concerns, please let me know.    Bone health: Get at least 150 minutes of aerobic exercise weekly Get weight bearing exercise at least once weekly Bone density test:  A bone density test is an imaging test that uses a type of X-ray to measure the amount of calcium and other minerals in your bones. The test may be used to diagnose or screen you for a condition that causes weak or thin bones (osteoporosis), predict your risk for a broken bone (fracture), or determine how well your osteoporosis treatment is working. The bone density test is recommended for females 12 and older, or females or males [id number] if certain risk factors such as thyroid disease, long term use of steroids such as for asthma or rheumatological issues, vitamin D deficiency, estrogen deficiency, family history of osteoporosis, self or family history of fragility fracture in first degree relative.  I reviewed your bone density test from 2021 showing osteopenia.    Heart health: Get at least 150 minutes of aerobic exercise weekly Limit alcohol It is important to maintain a healthy blood pressure and healthy cholesterol numbers  Heart disease screening: Screening for heart disease includes screening for blood pressure, fasting lipids, glucose/diabetes screening, BMI height to weight ratio, reviewed of smoking status, physical activity, and diet.    Goals include blood pressure 120/80 or less, maintaining a healthy lipid/cholesterol profile, preventing diabetes or keeping diabetes numbers under good control, not smoking or using tobacco products, exercising most days per week or at least 150 minutes per week of exercise, and eating healthy variety of fruits and vegetables, healthy oils, and avoiding unhealthy food choices like fried food, fast food, high sugar and high cholesterol foods.      Problem list: Hypertension-continue current medication Losartan 50  mg daily  Hyperlipidemia-continue atorvastatin Lipitor 40 mg daily and  Niacin 250 mg daily  Vitamin D deficiency-continue vitamin D supplement daily    Medical care options: I recommend you continue to seek care here first for routine care.  We try really hard to have available appointments Monday through Friday daytime hours for sick visits, acute visits, and physicals.  Urgent care should be used for after hours and weekends for significant issues that cannot wait till the next day.  The emergency department should be used for significant potentially life-threatening emergencies.  The emergency department is expensive, can often have long wait times for less significant concerns, so try to utilize primary care, urgent care, or telemedicine when possible to avoid unnecessary trips to the emergency department.  Virtual visits and telemedicine have been introduced since the pandemic started in 2020, and can be convenient ways to receive medical care.  We offer virtual appointments as well to assist you in a variety of options to seek medical care.   Advanced Directives: I recommend you consider completing a [place] and Living Will.   These documents respect your wishes and help alleviate burdens on your loved ones if you were to become terminally ill or be in a position to need those documents enforced.    You can complete Advanced Directives yourself, have them notarized, then have copies made for our office, for you and for anybody you feel should have them in safe keeping.  Or, you can have an attorney prepare these documents.   If you haven't updated your Last Will and Testament in a while, it may be worthwhile having an attorney prepare these documents together and save on some costs.      Nancy Castro was seen today for fasting cpe/awv.  Diagnoses and all orders for this visit:  Routine general medical examination at a health care facility -     Comprehensive metabolic panel -     CBC -     Lipid panel -     VITAMIN D 25 Hydroxy (Vit-D  Deficiency, Fractures) -     Hemoglobin A1c  Medicare annual wellness visit, subsequent  Need for pneumococcal vaccination  Essential hypertension -     Comprehensive metabolic panel  At high risk for falls  Polyp of colon, unspecified part of colon, unspecified type  Postmenopausal  Vaccine counseling  Vitamin D deficiency -     VITAMIN D 25 Hydroxy (Vit-D Deficiency, Fractures)  Primary osteoarthritis involving multiple joints  Mixed dyslipidemia -     Lipid panel  Mild cognitive impairment  History of fall  Gait instability  Estrogen deficiency  Impaired fasting blood sugar -     Hemoglobin A1c  Other orders -     Pneumococcal polysaccharide vaccine 23-valent greater than or equal to 2yo subcutaneous/IM     Medicare Attestation A preventative services visit was completed today.  During the course of the visit the patient was educated and counseled about appropriate screening and preventive services.  A health risk assessment was established with the patient that included a review of current medications, allergies, social history, family history, medical and preventative health history, biometrics, and preventative screenings to identify potential safety concerns or impairments.  A personalized plan was printed today for the patient's records and use.   Personalized health advice and education was given today to reduce health risks and promote self management and wellness.  Information regarding end of life planning was discussed today.  Dorothea Ogle, PA-C   06/09/2021

## 2021-06-09 NOTE — Patient Instructions (Signed)
This visit was a preventative care visit, also known as wellness visit or routine physical.   Topics typically include healthy lifestyle, diet, exercise, preventative care, vaccinations, sick and well care, proper use of emergency dept and after hours care, as well as other concerns.     Recommendations: Continue to return yearly for your annual wellness and preventative care visits.  This gives [place] a chance to discuss healthy lifestyle, exercise, vaccinations, review your chart record, and perform screenings where appropriate.  I recommend you see your eye doctor yearly for routine vision care.  I recommend you see your dentist yearly for routine dental care including hygiene visits twice yearly.   Vaccination recommendations were reviewed Immunization History  Administered Date(s) Administered   Influenza, High Dose Seasonal PF 08/07/2015, 08/20/2016, 08/16/2018, 07/12/2019, 07/09/2020   Influenza, Seasonal, Injecte, Preservative Fre 10/10/2012   Influenza-Unspecified 09/12/2014, 07/30/2017, 08/16/2018   Moderna Sars-Covid-2 Vaccination 12/17/2019, 01/15/2020, 09/17/2020   Pneumococcal Conjugate-13 06/20/2020   Pneumococcal Polysaccharide-23 08/07/2015    Shingles vaccine:  I recommend you have a shingles vaccine to help prevent shingles or herpes zoster outbreak.   Please call your insurer to inquire about coverage for the Shingrix vaccine given in 2 doses.   Some insurers cover this vaccine after age 60, some cover this after age 5.  If your insurer covers this, then call to schedule appointment to have this vaccine here.  I also recommend you call insurance about coverage for Tetanus booster as well  I recommend a yearly flu shot in the fall  I recommend an updated pneumococcal 23 vaccine booster.  Last one was > 5 years ago  Counseled on the pneumococcal vaccine.  Vaccine information sheet given.  Pneumococcal vaccine PPSV23 given after consent obtained.    Screening for  cancer: Colon cancer screening: I reviewed your colonoscopy on file that is up to date from 07/2018.  Breast cancer screening: You should perform a self breast exam monthly.   We reviewed recommendations for regular mammograms and breast cancer screening.  Cervical cancer screening: You no longer need pap smears.  USPSTF recommends screening up to age 34   Skin cancer screening: Check your skin regularly for new changes, growing lesions, or other lesions of concern Come in for evaluation if you have skin lesions of concern.  Lung cancer screening: If you have a greater than 20 pack year history of tobacco use, then you may qualify for lung cancer screening with a chest CT scan.   Please call your insurance company to inquire about coverage for this test.  We currently don't have screenings for other cancers besides breast, cervical, colon, and lung cancers.  If you have a strong family history of cancer or have other cancer screening concerns, please let me know.    Bone health: Get at least 150 minutes of aerobic exercise weekly Get weight bearing exercise at least once weekly Bone density test:  A bone density test is an imaging test that uses a type of X-ray to measure the amount of calcium and other minerals in your bones. The test may be used to diagnose or screen you for a condition that causes weak or thin bones (osteoporosis), predict your risk for a broken bone (fracture), or determine how well your osteoporosis treatment is working. The bone density test is recommended for females 61 and older, or females or males [id number] if certain risk factors such as thyroid disease, long term use of steroids such as for asthma or rheumatological issues,  vitamin D deficiency, estrogen deficiency, family history of osteoporosis, self or family history of fragility fracture in first degree relative.  I reviewed your bone density test from 2021 showing osteopenia.    Heart health: Get at least  150 minutes of aerobic exercise weekly Limit alcohol It is important to maintain a healthy blood pressure and healthy cholesterol numbers  Heart disease screening: Screening for heart disease includes screening for blood pressure, fasting lipids, glucose/diabetes screening, BMI height to weight ratio, reviewed of smoking status, physical activity, and diet.    Goals include blood pressure 120/80 or less, maintaining a healthy lipid/cholesterol profile, preventing diabetes or keeping diabetes numbers under good control, not smoking or using tobacco products, exercising most days per week or at least 150 minutes per week of exercise, and eating healthy variety of fruits and vegetables, healthy oils, and avoiding unhealthy food choices like fried food, fast food, high sugar and high cholesterol foods.      Problem list: Hypertension-continue current medication Losartan 50 mg daily  Hyperlipidemia-continue atorvastatin Lipitor 40 mg daily and Niacin 250 mg daily  Vitamin D deficiency-continue vitamin D supplement daily    Medical care options: I recommend you continue to seek care here first for routine care.  We try really hard to have available appointments Monday through Friday daytime hours for sick visits, acute visits, and physicals.  Urgent care should be used for after hours and weekends for significant issues that cannot wait till the next day.  The emergency department should be used for significant potentially life-threatening emergencies.  The emergency department is expensive, can often have long wait times for less significant concerns, so try to utilize primary care, urgent care, or telemedicine when possible to avoid unnecessary trips to the emergency department.  Virtual visits and telemedicine have been introduced since the pandemic started in 2020, and can be convenient ways to receive medical care.  We offer virtual appointments as well to assist you in a variety of options to  seek medical care.   Advanced Directives: I recommend you consider completing a [place] and Living Will.   These documents respect your wishes and help alleviate burdens on your loved ones if you were to become terminally ill or be in a position to need those documents enforced.    You can complete Advanced Directives yourself, have them notarized, then have copies made for our office, for you and for anybody you feel should have them in safe keeping.  Or, you can have an attorney prepare these documents.   If you haven't updated your Last Will and Testament in a while, it may be worthwhile having an attorney prepare these documents together and save on some costs.

## 2021-06-10 ENCOUNTER — Other Ambulatory Visit: Payer: Self-pay | Admitting: Medical

## 2021-06-10 DIAGNOSIS — E782 Mixed hyperlipidemia: Secondary | ICD-10-CM

## 2021-06-10 DIAGNOSIS — I10 Essential (primary) hypertension: Secondary | ICD-10-CM

## 2021-06-10 LAB — COMPREHENSIVE METABOLIC PANEL
ALT: 13 IU/L (ref 0–32)
AST: 19 IU/L (ref 0–40)
Albumin/Globulin Ratio: 2.5 — ABNORMAL HIGH (ref 1.2–2.2)
Albumin: 4.8 g/dL (ref 3.8–4.8)
Alkaline Phosphatase: 78 IU/L (ref 44–121)
BUN/Creatinine Ratio: 23 (ref 12–28)
BUN: 14 mg/dL (ref 8–27)
Bilirubin Total: 0.4 mg/dL (ref 0.0–1.2)
CO2: 23 mmol/L (ref 20–29)
Calcium: 9.8 mg/dL (ref 8.7–10.3)
Chloride: 103 mmol/L (ref 96–106)
Creatinine, Ser: 0.62 mg/dL (ref 0.57–1.00)
Globulin, Total: 1.9 g/dL (ref 1.5–4.5)
Glucose: 102 mg/dL — ABNORMAL HIGH (ref 65–99)
Potassium: 4.3 mmol/L (ref 3.5–5.2)
Sodium: 144 mmol/L (ref 134–144)
Total Protein: 6.7 g/dL (ref 6.0–8.5)
eGFR: 96 mL/min/{1.73_m2} (ref >59)

## 2021-06-10 LAB — VITAMIN D 25 HYDROXY (VIT D DEFICIENCY, FRACTURES): Vit D, 25-Hydroxy: 41.5 ng/mL (ref 30.0–100.0)

## 2021-06-10 LAB — CBC
Hematocrit: 35.6 % (ref 34.0–46.6)
Hemoglobin: 11.4 g/dL (ref 11.1–15.9)
MCH: 31.3 pg (ref 26.6–33.0)
MCHC: 32 g/dL (ref 31.5–35.7)
MCV: 98 fL — ABNORMAL HIGH (ref 79–97)
Platelets: 244 10*3/uL (ref 150–450)
RBC: 3.64 x10E6/uL — ABNORMAL LOW (ref 3.77–5.28)
RDW: 11.8 % (ref 11.7–15.4)
WBC: 3.7 10*3/uL (ref 3.4–10.8)

## 2021-06-10 LAB — HEMOGLOBIN A1C
Est. average glucose Bld gHb Est-mCnc: 100 mg/dL
Hgb A1c MFr Bld: 5.1 % (ref 4.8–5.6)

## 2021-06-10 LAB — LIPID PANEL
Chol/HDL Ratio: 2.2 ratio (ref 0.0–4.4)
Cholesterol, Total: 206 mg/dL — ABNORMAL HIGH (ref 100–199)
HDL: 93 mg/dL (ref >39)
LDL Chol Calc (NIH): 102 mg/dL — ABNORMAL HIGH (ref 0–99)
Triglycerides: 59 mg/dL (ref 0–149)
VLDL Cholesterol Cal: 11 mg/dL (ref 5–40)

## 2021-06-10 MED ORDER — NIACIN 250 MG PO TABS: 500.0000 mg | ORAL_TABLET | Freq: Every day | ORAL | 3 refills | Status: DC

## 2021-06-10 MED ORDER — VITAMIN D 25 MCG (1000 UNIT) PO TABS: 1000.0000 [IU] | ORAL_TABLET | Freq: Every day | ORAL | 3 refills | Status: DC

## 2021-06-11 ENCOUNTER — Other Ambulatory Visit: Payer: Self-pay | Admitting: *Deleted

## 2021-06-11 NOTE — Patient Instructions (Signed)
Goals Addressed             This Visit's Progress    THN Patient will verbalize maintaining a B/P below 150/90 for the next 90 days   On track    Timeframe:  Long-Range Goal Priority:  High Start Date: 08/08/20                            Expected End Date: 10/01/21                     Follow Up Date 08/31/21   - check blood pressure daily - write blood pressure results in a log or diary  -Encouraged patient to continue to eat a low salt and heart healthy diet -Encouraged patient to continue to exercise at the [facility] times weekly -Encouraged continuation of going outside to do yard work, gardening, and enjoy nature.   Why is this important?   You won't feel high blood pressure, but it can still hurt your blood vessels.  High blood pressure can cause heart or kidney problems. It can also cause a stroke.  Making lifestyle changes like losing a little weight or eating less salt will help.  Checking your blood pressure at home and at different times of the day can help to control blood pressure.  If the doctor prescribes medicine remember to take it the way the doctor ordered.  Call the office if you cannot afford the medicine or if there are questions about it.     Notes: Updated 06/09/21: Patient continues to take her B/P daily and records the values. She goes to the [facility] 2 times weekly for exercise classes, maintains her home, does gardening, and mows her yard. Patient is eating healthy and makes nutritious shakes routinely. Currently her B/P is well controlled.    Patient continues to take her B/P daily and records values. She exercises at the [facility] and is eating a heart healthy, low salt diet. Updated: 11/04/20 Updated 12/05/20: Patient reports continuing to go to the [facility] to exercise and socialize twice weekly. Patient takes her B/P daily and records the values. She limits the amount of salt she eats in her diet and several servings of fruits and vegetables  daily.   Updated 01/16/21: Patient continues to monitor her B/P daily and record the values. She goes to the [facility] twice weekly to exercise and socialize. Patient states she eats a lot of fruits and vegetables daily and is adhering to a low sodium diet. Patient looks forward to spending time outside doing yard work and gardening now that the weather is getting warmer.   Update 02/13/21: Patient continues to do an excellent job with controlling her B/P and her overall health. Patient goes to the [facility] twice weekly to exercise and she eats a low sodium, heart healthy diet, full of vegetables and fruits. Patient reports that she has bought plants to start her vegetable garden.   Updated 03/21/21: Patient reports that her B/P continues to be well controlled. She explains that she is feeling good, continues to eat healthy, and stays physically active by gardening, mowing her yard, and going to the [facility] for classes.  Updated 04/18/21: Patient continues to report that her hypertension is well controlled. She continues to eat healthy, and stays physically active by gardening, mowing her yard, and going to the [facility] for classes.

## 2021-06-11 NOTE — Patient Outreach (Signed)
[facility]) Care Management  [place]  06/11/2021   Nancy Castro November 14, 1950 [id number]  Subjective: Successful telephone outreach call to patient. HIPAA identifiers obtained. Patient states she is dong well at this time. Patient had an appointment with her PCP on 06/09/21 and states all of her results were good. Nurse discussed with patient her health goals and her health and wellness needs which were documented in the Epic system. Patient did not have any further questions or concerns today and did confirm that she has this nurse's contact number to call her if needed.   Encounter Medications:  Outpatient Encounter Medications as of 06/11/2021  Medication Sig   acetaminophen-codeine (TYLENOL #3) 300-30 MG tablet Take 1 tablet by mouth at bedtime as needed for moderate pain.   atorvastatin (LIPITOR) 40 MG tablet TAKE 1 TABLET(40 MG) BY MOUTH DAILY   cholecalciferol (VITAMIN D3) 25 MCG (1000 UNIT) tablet Take 1 tablet (1,000 Units total) by mouth daily.   losartan (COZAAR) 50 MG tablet TAKE 1 TABLET(50 MG) BY MOUTH DAILY   MULTIPLE VITAMIN PO Take 1 tablet by mouth daily.    niacin 250 MG tablet Take 2 tablets (500 mg total) by mouth at bedtime. 2 tablets OTC daily   No facility-administered encounter medications on file as of 06/11/2021.    Functional Status:  In your present state of health, do you have any difficulty performing the following activities: 06/09/2021  Hearing? N  Vision? N  Difficulty concentrating or making decisions? N  Walking or climbing stairs? N  Dressing or bathing? N  Doing errands, shopping? N  Preparing Food and eating ? N  Using the Toilet? N  In the past six months, have you accidently leaked urine? N  Do you have problems with loss of bowel control? N  Managing your Medications? N  Managing your Finances? N  Housekeeping or managing your Housekeeping? N  Some recent data might be hidden    Fall/Depression Screening: Fall  Risk  06/11/2021 06/09/2021 04/18/2021  Falls in the past year? 0 0 0  Number falls in past yr: 0 0 0  Injury with Fall? 0 0 0  Risk for fall due to : - No Fall Risks -  Follow up - Falls evaluation completed -   PHQ 2/9 Scores 06/09/2021 08/08/2020 02/21/2020 02/20/2020 12/13/2018 08/25/2017 02/05/2016  PHQ - 2 Score 0 0 0 2 0 0 0  PHQ- 9 Score - - 0 6 - - -    Assessment:   Care Plan There are no care plans that you recently modified to display for this patient.    Goals Addressed             This Visit's Progress    THN Patient will verbalize maintaining a B/P below 150/90 for the next 90 days   On track    Timeframe:  Long-Range Goal Priority:  High Start Date: 08/08/20                            Expected End Date: 10/01/21                     Follow Up Date 08/31/21   - check blood pressure daily - write blood pressure results in a log or diary  -Encouraged patient to continue to eat a low salt and heart healthy diet -Encouraged patient to continue to exercise at the [facility] 2  times weekly -Encouraged continuation of going outside to do yard work, gardening, and enjoy nature.   Why is this important?   You won't feel high blood pressure, but it can still hurt your blood vessels.  High blood pressure can cause heart or kidney problems. It can also cause a stroke.  Making lifestyle changes like losing a little weight or eating less salt will help.  Checking your blood pressure at home and at different times of the day can help to control blood pressure.  If the doctor prescribes medicine remember to take it the way the doctor ordered.  Call the office if you cannot afford the medicine or if there are questions about it.     Notes: Updated 06/09/21: Patient continues to take her B/P daily and records the values. She goes to the [facility] 2 times weekly for exercise classes, maintains her home, does gardening, and mows her yard. Patient is eating healthy and makes nutritious  shakes routinely. Currently her B/P is well controlled.    Patient continues to take her B/P daily and records values. She exercises at the [facility] and is eating a heart healthy, low salt diet. Updated: 11/04/20 Updated 12/05/20: Patient reports continuing to go to the [facility] to exercise and socialize twice weekly. Patient takes her B/P daily and records the values. She limits the amount of salt she eats in her diet and several servings of fruits and vegetables daily.   Updated 01/16/21: Patient continues to monitor her B/P daily and record the values. She goes to the [facility] twice weekly to exercise and socialize. Patient states she eats a lot of fruits and vegetables daily and is adhering to a low sodium diet. Patient looks forward to spending time outside doing yard work and gardening now that the weather is getting warmer.   Update 02/13/21: Patient continues to do an excellent job with controlling her B/P and her overall health. Patient goes to the [facility] twice weekly to exercise and she eats a low sodium, heart healthy diet, full of vegetables and fruits. Patient reports that she has bought plants to start her vegetable garden.   Updated 03/21/21: Patient reports that her B/P continues to be well controlled. She explains that she is feeling good, continues to eat healthy, and stays physically active by gardening, mowing her yard, and going to the [facility] for classes.  Updated 04/18/21: Patient continues to report that her hypertension is well controlled. She continues to eat healthy, and stays physically active by gardening, mowing her yard, and going to the [facility] for classes.       Plan: RN Health Coach will send PCP a  quarterly update and will call patient within the month of September as requested by patient. Follow-up: Patient agrees to Care Plan and Follow-up.  Emelia Loron RN, BSN [place] [phone] [email or web address]

## 2021-07-17 ENCOUNTER — Other Ambulatory Visit: Payer: Self-pay | Admitting: *Deleted

## 2021-07-17 NOTE — Patient Instructions (Signed)
Goals Addressed             This Visit's Progress    THN Patient will verbalize maintaining a B/P below 150/90 for the next 90 days       Timeframe:  Long-Range Goal Priority:  High Start Date: 08/08/20                            Expected End Date: 10/01/21                     Follow Up Date 10/01/21   - check blood pressure daily - write blood pressure results in a log or diary  -Encouraged patient to continue to eat a low salt and heart healthy diet -Encouraged patient to continue to exercise at the [place] continuation of going outside to do yard work, gardening, and enjoy nature.   Why is this important?   You won't feel high blood pressure, but it can still hurt your blood vessels.  High blood pressure can cause heart or kidney problems. It can also cause a stroke.  Making lifestyle changes like losing a little weight or eating less salt will help.  Checking your blood pressure at home and at different times of the day can help to control blood pressure.  If the doctor prescribes medicine remember to take it the way the doctor ordered.  Call the office if you cannot afford the medicine or if there are questions about it.     Notes: 07/17/21: Patient's B/P continues to be well controlled with today's value of 125/65. Patient reports continuation of going to the [facility] and maintaining a healthy diet.  Nurse praised the patient for her dedication to her health and wellness.  Updated 06/11/21: Patient continues to take her B/P daily and records the values. She goes to the [facility] 2 times weekly for exercise classes, maintains her home, does gardening, and mows her yard. Patient is eating healthy and makes nutritious shakes routinely. Currently her B/P is well controlled.    Patient continues to take her B/P daily and records values. She exercises at the [facility] and is eating a heart healthy, low salt diet. Updated: 11/04/20 Updated 12/05/20: Patient  reports continuing to go to the [facility] to exercise and socialize twice weekly. Patient takes her B/P daily and records the values. She limits the amount of salt she eats in her diet and several servings of fruits and vegetables daily.   Updated 01/16/21: Patient continues to monitor her B/P daily and record the values. She goes to the [facility] twice weekly to exercise and socialize. Patient states she eats a lot of fruits and vegetables daily and is adhering to a low sodium diet. Patient looks forward to spending time outside doing yard work and gardening now that the weather is getting warmer.   Update 02/13/21: Patient continues to do an excellent job with controlling her B/P and her overall health. Patient goes to the [facility] twice weekly to exercise and she eats a low sodium, heart healthy diet, full of vegetables and fruits. Patient reports that she has bought plants to start her vegetable garden.   Updated 03/21/21: Patient reports that her B/P continues to be well controlled. She explains that she is feeling good, continues to eat healthy, and stays physically active by gardening, mowing her yard, and going to the [facility] for classes.  Updated  04/18/21: Patient continues to report that her hypertension is well controlled. She continues to eat healthy, and stays physically active by gardening, mowing her yard, and going to the [facility] for classes.

## 2021-07-17 NOTE — Patient Outreach (Signed)
[name]) Care Management  [place]  07/17/2021   Nancy Castro 27-Oct-1951 [id number]  Subjective: Successful telephone outreach call to patient. HIPAA identifiers obtained. Patient states she is dong well at this time. Nurse discussed with patient her health goals and her health and wellness needs which were documented in the Epic system. Patient did not have any further questions or concerns today and did confirm that she has this nurse's contact number to call her if needed.   Encounter Medications:  Outpatient Encounter Medications as of 07/17/2021  Medication Sig   acetaminophen-codeine (TYLENOL #3) 300-30 MG tablet Take 1 tablet by mouth at bedtime as needed for moderate pain.   atorvastatin (LIPITOR) 40 MG tablet TAKE 1 TABLET(40 MG) BY MOUTH DAILY   cholecalciferol (VITAMIN D3) 25 MCG (1000 UNIT) tablet Take 1 tablet (1,000 Units total) by mouth daily.   losartan (COZAAR) 50 MG tablet TAKE 1 TABLET(50 MG) BY MOUTH DAILY   MULTIPLE VITAMIN PO Take 1 tablet by mouth daily.    niacin 250 MG tablet Take 2 tablets (500 mg total) by mouth at bedtime. 2 tablets OTC daily   No facility-administered encounter medications on file as of 07/17/2021.    Functional Status:  In your present state of health, do you have any difficulty performing the following activities: 06/09/2021  Hearing? N  Vision? N  Difficulty concentrating or making decisions? N  Walking or climbing stairs? N  Dressing or bathing? N  Doing errands, shopping? N  Preparing Food and eating ? N  Using the Toilet? N  In the past six months, have you accidently leaked urine? N  Do you have problems with loss of bowel control? N  Managing your Medications? N  Managing your Finances? N  Housekeeping or managing your Housekeeping? N  Some recent data might be hidden    Fall/Depression Screening: Fall Risk  07/17/2021 06/11/2021 06/09/2021  Falls in the past year? 0 0 0  Number falls in past  yr: 0 0 0  Injury with Fall? 0 0 0  Risk for fall due to : - - No Fall Risks  Follow up Falls evaluation completed - Falls evaluation completed   PHQ 2/9 Scores 06/09/2021 08/08/2020 02/21/2020 02/20/2020 12/13/2018 08/25/2017 02/05/2016  PHQ - 2 Score 0 0 0 2 0 0 0  PHQ- 9 Score - - 0 6 - - -    Assessment:   Care Plan There are no care plans that you recently modified to display for this patient.    Goals Addressed             This Visit's Progress    THN Patient will verbalize maintaining a B/P below 150/90 for the next 90 days       Timeframe:  Long-Range Goal Priority:  High Start Date: 08/08/20                            Expected End Date: 10/01/21                     Follow Up Date 10/01/21   - check blood pressure daily - write blood pressure results in a log or diary  -Encouraged patient to continue to eat a low salt and heart healthy diet -Encouraged patient to continue to exercise at the [place] continuation of going outside to do yard work, gardening, and enjoy nature.   Why  is this important?   You won't feel high blood pressure, but it can still hurt your blood vessels.  High blood pressure can cause heart or kidney problems. It can also cause a stroke.  Making lifestyle changes like losing a little weight or eating less salt will help.  Checking your blood pressure at home and at different times of the day can help to control blood pressure.  If the doctor prescribes medicine remember to take it the way the doctor ordered.  Call the office if you cannot afford the medicine or if there are questions about it.     Notes: 07/17/21: Patient's B/P continues to be well controlled with today's value of 125/65. Patient reports continuation of going to the [facility] and maintaining a healthy diet.  Nurse praised the patient for her dedication to her health and wellness.  Updated 06/11/21: Patient continues to take her B/P daily and records the values. She  goes to the [facility] 2 times weekly for exercise classes, maintains her home, does gardening, and mows her yard. Patient is eating healthy and makes nutritious shakes routinely. Currently her B/P is well controlled.    Patient continues to take her B/P daily and records values. She exercises at the [facility] and is eating a heart healthy, low salt diet. Updated: 11/04/20 Updated 12/05/20: Patient reports continuing to go to the [facility] to exercise and socialize twice weekly. Patient takes her B/P daily and records the values. She limits the amount of salt she eats in her diet and several servings of fruits and vegetables daily.   Updated 01/16/21: Patient continues to monitor her B/P daily and record the values. She goes to the [facility] twice weekly to exercise and socialize. Patient states she eats a lot of fruits and vegetables daily and is adhering to a low sodium diet. Patient looks forward to spending time outside doing yard work and gardening now that the weather is getting warmer.   Update 02/13/21: Patient continues to do an excellent job with controlling her B/P and her overall health. Patient goes to the [facility] twice weekly to exercise and she eats a low sodium, heart healthy diet, full of vegetables and fruits. Patient reports that she has bought plants to start her vegetable garden.   Updated 03/21/21: Patient reports that her B/P continues to be well controlled. She explains that she is feeling good, continues to eat healthy, and stays physically active by gardening, mowing her yard, and going to the [facility] for classes.  Updated 04/18/21: Patient continues to report that her hypertension is well controlled. She continues to eat healthy, and stays physically active by gardening, mowing her yard, and going to the [facility] for classes.       Plan: RN Health Coach will call patient within the month of November. Follow-up: Patient agrees to Care Plan and  Follow-up.  Emelia Loron RN, BSN [place] [phone] Nancy Castro

## 2021-08-21 ENCOUNTER — Other Ambulatory Visit: Payer: Self-pay | Admitting: *Deleted

## 2021-08-21 NOTE — Patient Outreach (Addendum)
[facility]) Care Management  08/21/2021  Nancy Castro 11/08/1950 [id number]  Successful telephone outreach call to patient. HIPAA identifiers obtained. Nurse contacted patient to inform her that this nurse will no longer be able to make outreach calls to her due to her having an ineligible plan or provider. Patient verbalized understanding and thanked this nurse for the care that she has provided to her as a patient.   Plan: Nurse will close case and send both PCP and patient a case closed letter.   Emelia Loron RN, [place] [phone] [email or web address]

## 2021-10-27 ENCOUNTER — Other Ambulatory Visit: Payer: Self-pay | Admitting: Medical

## 2021-10-27 DIAGNOSIS — I10 Essential (primary) hypertension: Secondary | ICD-10-CM

## 2021-12-25 ENCOUNTER — Telehealth: Payer: Self-pay | Admitting: Medical

## 2021-12-25 NOTE — Telephone Encounter (Signed)
Left message for patient to call back and schedule Medicare Annual Wellness Visit (AWV) either virtually or in office. I left my number for patient to call [phone].  Last AWV ;06/09/21 please schedule at anytime with health coach  This should be a 45 minute visit.    AWV can be scheduled calendar year [facility]

## 2022-01-05 ENCOUNTER — Other Ambulatory Visit: Payer: Self-pay | Admitting: Medical

## 2022-01-05 DIAGNOSIS — E782 Mixed hyperlipidemia: Secondary | ICD-10-CM

## 2022-01-13 ENCOUNTER — Telehealth: Payer: Self-pay | Admitting: Medical

## 2022-01-13 NOTE — Telephone Encounter (Signed)
Left message for patient to call back and schedule Medicare Annual Wellness Visit (AWV) either virtually or in office. ?I left my number for patient to call [phone]. ? ?Last AWV 06/09/21 ? please schedule at anytime with health coach ? ?This should be a 45 minute visit.   ?Can schedule AWV calendar year [employer] ?

## 2022-01-25 ENCOUNTER — Other Ambulatory Visit: Payer: Self-pay | Admitting: Medical

## 2022-01-25 DIAGNOSIS — I10 Essential (primary) hypertension: Secondary | ICD-10-CM

## 2022-01-25 DIAGNOSIS — E782 Mixed hyperlipidemia: Secondary | ICD-10-CM

## 2022-04-25 ENCOUNTER — Other Ambulatory Visit: Payer: Self-pay | Admitting: Medical

## 2022-04-25 DIAGNOSIS — I10 Essential (primary) hypertension: Secondary | ICD-10-CM

## 2022-05-08 ENCOUNTER — Encounter: Payer: Self-pay | Admitting: Internal Medicine

## 2022-05-08 LAB — HM MAMMOGRAPHY

## 2022-05-11 ENCOUNTER — Telehealth: Payer: Self-pay | Admitting: Medical

## 2022-05-11 NOTE — Telephone Encounter (Signed)
I am happy to report that her mammogram was normal, no worrisome findings.

## 2022-05-11 NOTE — Telephone Encounter (Signed)
Left VM with results.

## 2022-06-03 ENCOUNTER — Telehealth: Payer: Self-pay | Admitting: Medical

## 2022-06-03 NOTE — Telephone Encounter (Signed)
Left message for patient to call back and schedule Medicare Annual Wellness Visit (AWV) either virtually or in office. I left my number for patient to call [phone].  Last AWV ;06/09/21  please schedule at anytime with health coach  .

## 2022-06-17 ENCOUNTER — Other Ambulatory Visit: Payer: Self-pay | Admitting: Medical

## 2022-06-17 DIAGNOSIS — E782 Mixed hyperlipidemia: Secondary | ICD-10-CM

## 2022-07-08 ENCOUNTER — Encounter: Payer: Self-pay | Admitting: Internal Medicine

## 2022-07-08 ENCOUNTER — Telehealth: Payer: Self-pay | Admitting: Medical

## 2022-07-08 NOTE — Telephone Encounter (Signed)
Left message for patient to call back and schedule Medicare Annual Wellness Visit (AWV) either virtually or in office. I left my number for patient to call [phone].  Last AWV ;06/09/21  please schedule at anytime with health coach

## 2022-07-17 ENCOUNTER — Ambulatory Visit (INDEPENDENT_AMBULATORY_CARE_PROVIDER_SITE_OTHER): Payer: Medicare Other

## 2022-07-17 VITALS — Ht 61.0 in | Wt 134.0 lb

## 2022-07-17 DIAGNOSIS — Z0000 Encounter for general adult medical examination without abnormal findings: Secondary | ICD-10-CM | POA: Diagnosis not present

## 2022-07-17 NOTE — Progress Notes (Signed)
I connected with Corrie Dandy today by telephone and verified that I am speaking with the correct person using two identifiers. Location patient: home Location provider: work Persons participating in the virtual visit: Nancy Castro, Glenna Durand LPN.   I discussed the limitations, risks, security and privacy concerns of performing an evaluation and management service by telephone and the availability of in person appointments. I also discussed with the patient that there may be a patient responsible charge related to this service. The patient expressed understanding and verbally consented to this telephonic visit.    Interactive audio and video telecommunications were attempted between this provider and patient, however failed, due to patient having technical difficulties OR patient did not have access to video capability.  We continued and completed visit with audio only.     Vital signs may be patient reported or missing.  Subjective:   Nancy Castro is a 71 y.o. female who presents for Medicare Annual (Subsequent) preventive examination.  Review of Systems     Cardiac Risk Factors include: advanced age (>36mn, >>13women);dyslipidemia;hypertension     Objective:    Today's Vitals   07/17/22 1511  Weight: 134 lb (60.8 kg)  Height: '5\' 1"'$  (1.549 m)   Body mass index is 25.32 kg/m.     07/17/2022    3:15 PM 06/10/2020   10:13 AM 02/20/2020    9:58 AM 02/16/2020    2:11 PM 01/07/2020    3:02 PM 01/07/2020   11:09 AM  Advanced Directives  Does Patient Have a Medical Advance Directive? Yes No No No No No  Type of Advance Directive Living will       Would patient like information on creating a medical advance directive?  No - Patient declined No - Patient declined  No - Patient declined     Current Medications (verified) Outpatient Encounter Medications as of 07/17/2022  Medication Sig   acetaminophen-codeine (TYLENOL #3) 300-30 MG tablet Take 1 tablet by mouth at bedtime as  needed for moderate pain.   atorvastatin (LIPITOR) 40 MG tablet TAKE 1 TABLET(40 MG) BY MOUTH DAILY   cholecalciferol (VITAMIN D3) 25 MCG (1000 UNIT) tablet Take 1 tablet (1,000 Units total) by mouth daily.   losartan (COZAAR) 50 MG tablet TAKE 1 TABLET(50 MG) BY MOUTH DAILY   MULTIPLE VITAMIN PO Take 1 tablet by mouth daily.    niacin 250 MG tablet Take 2 tablets (500 mg total) by mouth at bedtime. 2 tablets OTC daily   No facility-administered encounter medications on file as of 07/17/2022.    Allergies (verified) Patient has no known allergies.   History: Past Medical History:  Diagnosis Date   DDD (degenerative disc disease), cervical    H/O echocardiogram 2007   History of exercise stress test 2007   Hyperlipidemia    Hypertension    Mixed dyslipidemia    Osteoarthritis    Syncope 2012   cardiac stress test    Wears glasses    Wears partial dentures    upper and lower   Past Surgical History:  Procedure Laterality Date   COLONOSCOPY     2014, polyps, repeat 3 years   COLONOSCOPY  07/14/2018   tubular adenoma polyps, repeat 3 years, Dr. JZenovia Jarred  KNEE ARTHROSCOPY     right   LIPOMA EXCISION     back x 2   PARTIAL HYSTERECTOMY     fibroids; has both ovaries   Family History  Problem Relation Age of Onset  Cancer Mother        died of breast cancer   Cancer Father        father lung cancer   Cancer Sister        lung, mets to brain   Cancer Brother        lung and stomach   Stomach cancer Brother        died at age 13   Hypertension Maternal Grandmother    Cancer Brother        lung   Diabetes Brother    Heart disease Neg Hx    Stroke Neg Hx    Colon cancer Neg Hx    Rectal cancer Neg Hx    Social History   Socioeconomic History   Marital status: Divorced    Spouse name: Not on file   Number of children: 2   Years of education: 11   Highest education level: Not on file  Occupational History   Occupation: Retired  Tobacco Use   Smoking  status: Former    Packs/day: 0.25    Years: 5.00    Total pack years: 1.25    Types: Cigarettes    Quit date: 02/08/1987    Years since quitting: 35.4   Smokeless tobacco: Former    Types: Chew   Tobacco comments:    Stopped chewing years  ago.  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  No grandchildren.   Sews - hobby.  06/2021   Right handed   1 cup per day of caffeine use:    Sister Nancy Castro in [place] checks in on her regularly as well as her daughter Nancy Castro   Social Determinants of Health   Financial Resource Strain: Low Risk  (07/17/2022)   Overall Financial Resource Strain (CARDIA)    Difficulty of Paying Living Expenses: Not hard at all  Food Insecurity: No Food Insecurity (07/17/2022)   Hunger Vital Sign    Worried About Running Out of Food in the Last Year: Never true    Ran Out of Food in the Last Year: Never true  Transportation Needs: No Transportation Needs (07/17/2022)   PRAPARE - Hydrologist (Medical): No    Lack of Transportation (Non-Medical): No  Physical Activity: Insufficiently Active (07/17/2022)   Exercise Vital Sign    Days of Exercise per Week: 1 day    Minutes of Exercise per Session: 60 min  Stress: No Stress Concern Present (07/17/2022)   [place]    Feeling of Stress : Not at all  Social Connections: Not on file    Tobacco Counseling Counseling given: Not Answered Tobacco comments: Stopped chewing years  ago.   Clinical Intake:  Pre-visit preparation completed:  Yes  Pain : No/denies pain     Nutritional Status: BMI 25 -29 Overweight Nutritional Risks: None Diabetes: No  How often do you need to have someone help you when you read instructions, pamphlets, or other written materials from your doctor or pharmacy?:  1 - Never What is the last grade level you completed in school?: 11th grade  Diabetic? no  Interpreter Needed?: No  Information entered by :: NAllen LPN   Activities of Daily Living    07/17/2022    3:16 PM  In your present state of health, do you have any difficulty performing the following activities:  Hearing? 0  Vision? 0  Difficulty concentrating or making decisions? 0  Walking or climbing stairs? 0  Dressing or bathing? 0  Doing errands, shopping? 0  Preparing Food and eating ? N  Using the Toilet? N  In the past six months, have you accidently leaked urine? N  Do you have problems with loss of bowel control? N  Managing your Medications? N  Managing your Finances? N  Housekeeping or managing your Housekeeping? N    Patient Care Team: Tysinger, Camelia Eng, PA-C as PCP - General (Family Medicine)  Indicate any recent Medical Services you may have received from other than [facility] providers in the past year (date may be approximate).     Assessment:   This is a routine wellness examination for Nancy Castro.  Hearing/Vision screen Vision Screening - Comments:: Regular eye exams, [facility]  Dietary issues and exercise activities discussed: Current Exercise Habits: Home exercise routine, Type of exercise: walking, Time (Minutes): 60, Frequency (Times/Week): 1, Weekly Exercise (Minutes/Week): 60   Goals Addressed             This Visit's Progress    Patient Stated       07/17/2022, decluttering the house       Depression Screen    07/17/2022    3:15 PM 06/09/2021    9:29 AM 08/08/2020   10:18 AM 02/21/2020    1:42 PM 02/20/2020    9:16 AM 12/13/2018    9:20 AM 08/25/2017    9:10 AM  PHQ 2/9 Scores  PHQ - 2  Score 0 0 0 0 2 0 0  PHQ- 9 Score 0   0 6      Fall Risk    07/17/2022    3:15 PM 07/17/2021    9:55 AM 06/11/2021   10:03 AM 06/09/2021    9:29 AM 04/18/2021    9:59 AM  Fall Risk   Falls in the past year? 0 0 0 0 0  Number falls in past yr: 0 0 0 0 0  Injury with Fall? 0 0 0 0 0  Risk for fall due to : Medication side effect   No Fall Risks   Follow up Falls prevention discussed;Education provided;Falls evaluation completed Falls evaluation completed  Falls evaluation completed     FALL RISK PREVENTION PERTAINING TO THE HOME:  Any stairs in or around the home? Yes  If so, are there any without handrails? Yes  Home free of loose throw rugs in walkways, pet beds, electrical cords, etc? Yes  Adequate lighting in your home to reduce risk of falls? Yes   ASSISTIVE DEVICES UTILIZED TO PREVENT FALLS:  Life alert? No  Use of a cane, walker or w/c? No  Grab bars in the bathroom? Yes  Shower chair or bench in shower? Yes  Elevated toilet seat or a handicapped toilet? No   TIMED UP AND GO:  Was the test performed? No .       Cognitive Function:    01/17/2020    4:15 PM  MMSE - Mini Mental State Exam  Orientation to time 3  Orientation to Place 5  Registration 3  Attention/ Calculation 5  Recall 2  Language- name 2 objects 2  Language- repeat 1  Language- follow 3 step command 3  Language- read & follow direction 1  Write a sentence 1  Copy design 1  Total score 27      12/03/2014    9:46 AM  Montreal Cognitive Assessment   Visuospatial/ Executive (0/5) 4  Naming (0/3) 3  Attention: Read list of digits (0/2) 1  Attention: Read list of letters (0/1) 1  Attention: Serial 7 subtraction starting at 100 (0/3) 2  Language: Repeat phrase (0/2) 2  Language : Fluency (0/1) 0  Abstraction (0/2) 2  Delayed Recall (0/5) 3  Orientation (0/6) 6  Total 24  Adjusted Score (based on education) 24      07/17/2022    3:16 PM  6CIT Screen  What Year? 0 points  What month? 0  points  What time? 0 points  Count back from 20 0 points  Months in reverse 2 points  Repeat phrase 2 points  Total Score 4 points    Immunizations Immunization History  Administered Date(s) Administered   Influenza, High Dose Seasonal PF 08/07/2015, 08/20/2016, 08/16/2018, 07/12/2019, 07/09/2020   Influenza, Seasonal, Injecte, Preservative Fre 10/10/2012   Influenza-Unspecified 09/12/2014, 07/30/2017, 08/16/2018   Moderna Covid-19 Vaccine Bivalent Booster 23yr & up 02/10/2022   Moderna Sars-Covid-2 Vaccination 12/17/2019, 01/15/2020, 09/17/2020   Pneumococcal Conjugate-13 06/20/2020   Pneumococcal Polysaccharide-23 08/07/2015, 06/09/2021   Zoster Recombinat (Shingrix) 02/24/2022, 04/28/2022    TDAP status: Due, Education has been provided regarding the importance of this vaccine. Advised may receive this vaccine at local pharmacy or Health Dept. Aware to provide a copy of the vaccination record if obtained from local pharmacy or Health Dept. Verbalized acceptance and understanding.  Flu Vaccine status: Due, Education has been provided regarding the importance of this vaccine. Advised may receive this vaccine at local pharmacy or Health Dept. Aware to provide a copy of the vaccination record if obtained from local pharmacy or Health Dept. Verbalized acceptance and understanding.  Pneumococcal vaccine status: Up to date  Covid-19 vaccine status: Completed vaccines  Qualifies for Shingles Vaccine? Yes   Zostavax completed No   Shingrix Completed?: Yes  Screening Tests Health Maintenance  Topic Date Due   TETANUS/TDAP  Never done   COLONOSCOPY (Pts 45-452yrInsurance coverage will need to be confirmed)  07/14/2021   INFLUENZA VACCINE  06/02/2022   COVID-19 Vaccine (5 - Moderna series) 06/12/2022   MAMMOGRAM  05/09/2023   Pneumonia Vaccine 6581Years old  Completed   DEXA SCAN  Completed   Hepatitis C Screening  Completed   Zoster Vaccines- Shingrix  Completed   HPV VACCINES   Aged Out    Health Maintenance  Health Maintenance Due  Topic Date Due   TETANUS/TDAP  Never done   COLONOSCOPY (Pts [id number] coverage will need to be confirmed)  07/14/2021   INFLUENZA VACCINE  06/02/2022   COVID-19 Vaccine (5 - Moderna series) 06/12/2022    Colorectal cancer screening: Type of screening: Colonoscopy. Completed 07/14/2018. Repeat every 5 years  Mammogram status: Completed 05/08/2022. Repeat every year  Bone Density status: Completed 02/28/2020.   Lung Cancer Screening: (Low Dose CT Chest recommended if Age 64-39-80ars, 30 pack-year currently smoking OR have quit w/in 15years.) does not qualify.   Lung Cancer Screening Referral: no  Additional Screening:  Hepatitis C Screening: does qualify; Completed 01/09/2020  Vision Screening: Recommended annual ophthalmology exams for early detection  of glaucoma and other disorders of the eye. Is the patient up to date with their annual eye exam?  Yes  Who is the provider or what is the name of the office in which the patient attends annual eye exams? [facility] If pt is not established with a provider, would they like to be referred to a provider to establish care? No .   Dental Screening: Recommended annual dental exams for proper oral hygiene  Community Resource Referral / Chronic Care Management: CRR required this visit?  No   CCM required this visit?  No      Plan:     I have personally reviewed and noted the following in the patient's chart:   Medical and social history Use of alcohol, tobacco or illicit drugs  Current medications and supplements including opioid prescriptions. Patient is currently taking opioid prescriptions. Information provided to patient regarding non-opioid alternatives. Patient advised to discuss non-opioid treatment plan with their provider. Functional ability and status Nutritional status Physical activity Advanced directives List of other physicians Hospitalizations,  surgeries, and ER visits in previous 12 months Vitals Screenings to include cognitive, depression, and falls Referrals and appointments  In addition, I have reviewed and discussed with patient certain preventive protocols, quality metrics, and best practice recommendations. A written personalized care plan for preventive services as well as general preventive health recommendations were provided to patient.     Kellie Simmering, LPN   [id number]   Nurse Notes: none  Due to this being a virtual visit, the after visit summary with patients personalized plan was offered to patient via mail or my-chart. Patient would like to access on my-chart

## 2022-07-17 NOTE — Patient Instructions (Signed)
Nancy Castro , Thank you for taking time to come for your Medicare Wellness Visit. I appreciate your ongoing commitment to your health goals. Please review the following plan we discussed and let me know if I can assist you in the future.   Screening recommendations/referrals: Colonoscopy: completed 07/14/2018, due 07/15/2023 Mammogram: completed 05/08/2022, due 05/10/2023 Bone Density: completed 02/28/2020 Recommended yearly ophthalmology/optometry visit for glaucoma screening and checkup Recommended yearly dental visit for hygiene and checkup  Vaccinations: Influenza vaccine: due Pneumococcal vaccine: completed 06/09/2021 Tdap vaccine: due Shingles vaccine: completed   Covid-19: 02/10/2022, 09/17/2020, 01/15/2020, 12/17/2019  Advanced directives: Please bring a copy of your POA (Power of Attorney) and/or Living Will to your next appointment.   Conditions/risks identified: none  Next appointment: Follow up in one year for your annual wellness visit    Preventive Care 65 Years and Older, Female Preventive care refers to lifestyle choices and visits with your health care provider that can promote health and wellness. What does preventive care include? A yearly physical exam. This is also called an annual well check. Dental exams once or twice a year. Routine eye exams. Ask your health care provider how often you should have your eyes checked. Personal lifestyle choices, including: Daily care of your teeth and gums. Regular physical activity. Eating a healthy diet. Avoiding tobacco and drug use. Limiting alcohol use. Practicing safe sex. Taking low-dose aspirin every day. Taking vitamin and mineral supplements as recommended by your health care provider. What happens during an annual well check? The services and screenings done by your health care provider during your annual well check will depend on your age, overall health, lifestyle risk factors, and family history of  disease. Counseling  Your health care provider may ask you questions about your: Alcohol use. Tobacco use. Drug use. Emotional well-being. Home and relationship well-being. Sexual activity. Eating habits. History of falls. Memory and ability to understand (cognition). Work and work Statistician. Reproductive health. Screening  You may have the following tests or measurements: Height, weight, and BMI. Blood pressure. Lipid and cholesterol levels. These may be checked every 5 years, or more frequently if you are over 91 years old. Skin check. Lung cancer screening. You may have this screening every year starting at age 65 if you have a 30-pack-year history of smoking and currently smoke or have quit within the past 15 years. Fecal occult blood test (FOBT) of the stool. You may have this test every year starting at age 59. Flexible sigmoidoscopy or colonoscopy. You may have a sigmoidoscopy every 5 years or a colonoscopy every 10 years starting at age 86. Hepatitis C blood test. Hepatitis B blood test. Sexually transmitted disease (STD) testing. Diabetes screening. This is done by checking your blood sugar (glucose) after you have not eaten for a while (fasting). You may have this done every 1-3 years. Bone density scan. This is done to screen for osteoporosis. You may have this done starting at age 34. Mammogram. This may be done every 1-2 years. Talk to your health care provider about how often you should have regular mammograms. Talk with your health care provider about your test results, treatment options, and if necessary, the need for more tests. Vaccines  Your health care provider may recommend certain vaccines, such as: Influenza vaccine. This is recommended every year. Tetanus, diphtheria, and acellular pertussis (Tdap, Td) vaccine. You may need a Td booster every 10 years. Zoster vaccine. You may need this after age 28. Pneumococcal 13-valent conjugate (PCV13) vaccine. One  dose is recommended after age 35. Pneumococcal polysaccharide (PPSV23) vaccine. One dose is recommended after age 4. Talk to your health care provider about which screenings and vaccines you need and how often you need them. This information is not intended to replace advice given to you by your health care provider. Make sure you discuss any questions you have with your health care provider. Document Released: 11/15/2015 Document Revised: 07/08/2016 Document Reviewed: 08/20/2015 Elsevier Interactive Patient Education  2017 [place] Prevention in the Home Falls can cause injuries. They can happen to people of all ages. There are many things you can do to make your home safe and to help prevent falls. What can I do on the outside of my home? Regularly fix the edges of walkways and driveways and fix any cracks. Remove anything that might make you trip as you walk through a door, such as a raised step or threshold. Trim any bushes or trees on the path to your home. Use bright outdoor lighting. Clear any walking paths of anything that might make someone trip, such as rocks or tools. Regularly check to see if handrails are loose or broken. Make sure that both sides of any steps have handrails. Any raised decks and porches should have guardrails on the edges. Have any leaves, snow, or ice cleared regularly. Use sand or salt on walking paths during winter. Clean up any spills in your garage right away. This includes oil or grease spills. What can I do in the bathroom? Use night lights. Install grab bars by the toilet and in the tub and shower. Do not use towel bars as grab bars. Use non-skid mats or decals in the tub or shower. If you need to sit down in the shower, use a plastic, non-slip stool. Keep the floor dry. Clean up any water that spills on the floor as soon as it happens. Remove soap buildup in the tub or shower regularly. Attach bath mats securely with double-sided  non-slip rug tape. Do not have throw rugs and other things on the floor that can make you trip. What can I do in the bedroom? Use night lights. Make sure that you have a light by your bed that is easy to reach. Do not use any sheets or blankets that are too big for your bed. They should not hang down onto the floor. Have a firm chair that has side arms. You can use this for support while you get dressed. Do not have throw rugs and other things on the floor that can make you trip. What can I do in the kitchen? Clean up any spills right away. Avoid walking on wet floors. Keep items that you use a lot in easy-to-reach places. If you need to reach something above you, use a strong step stool that has a grab bar. Keep electrical cords out of the way. Do not use floor polish or wax that makes floors slippery. If you must use wax, use non-skid floor wax. Do not have throw rugs and other things on the floor that can make you trip. What can I do with my stairs? Do not leave any items on the stairs. Make sure that there are handrails on both sides of the stairs and use them. Fix handrails that are broken or loose. Make sure that handrails are as long as the stairways. Check any carpeting to make sure that it is firmly attached to the stairs. Fix any carpet that is loose or worn. Avoid  having throw rugs at the top or bottom of the stairs. If you do have throw rugs, attach them to the floor with carpet tape. Make sure that you have a light switch at the top of the stairs and the bottom of the stairs. If you do not have them, ask someone to add them for you. What else can I do to help prevent falls? Wear shoes that: Do not have high heels. Have rubber bottoms. Are comfortable and fit you well. Are closed at the toe. Do not wear sandals. If you use a stepladder: Make sure that it is fully opened. Do not climb a closed stepladder. Make sure that both sides of the stepladder are locked into place. Ask  someone to hold it for you, if possible. Clearly mark and make sure that you can see: Any grab bars or handrails. First and last steps. Where the edge of each step is. Use tools that help you move around (mobility aids) if they are needed. These include: Canes. Walkers. Scooters. Crutches. Turn on the lights when you go into a dark area. Replace any light bulbs as soon as they burn out. Set up your furniture so you have a clear path. Avoid moving your furniture around. If any of your floors are uneven, fix them. If there are any pets around you, be aware of where they are. Review your medicines with your doctor. Some medicines can make you feel dizzy. This can increase your chance of falling. Ask your doctor what other things that you can do to help prevent falls. This information is not intended to replace advice given to you by your health care provider. Make sure you discuss any questions you have with your health care provider. Document Released: 08/15/2009 Document Revised: 03/26/2016 Document Reviewed: 11/23/2014 Elsevier Interactive Patient Education  2017 [employer].

## 2022-07-24 ENCOUNTER — Other Ambulatory Visit: Payer: Self-pay | Admitting: Medical

## 2022-07-24 DIAGNOSIS — I10 Essential (primary) hypertension: Secondary | ICD-10-CM

## 2022-07-24 NOTE — Telephone Encounter (Signed)
Sent my hart message. [place]

## 2022-07-24 NOTE — Telephone Encounter (Signed)
Lvm for pt to call back and schedule an appt with Audelia Acton. Failing to do will result in denial of refill. [place]

## 2022-08-11 ENCOUNTER — Ambulatory Visit (INDEPENDENT_AMBULATORY_CARE_PROVIDER_SITE_OTHER): Payer: Medicare Other | Admitting: Medical

## 2022-08-11 ENCOUNTER — Encounter: Payer: Self-pay | Admitting: Internal Medicine

## 2022-08-11 VITALS — BP 116/62 | HR 72 | Ht 61.0 in | Wt 133.6 lb

## 2022-08-11 DIAGNOSIS — Z780 Asymptomatic menopausal state: Secondary | ICD-10-CM | POA: Diagnosis not present

## 2022-08-11 DIAGNOSIS — M159 Polyosteoarthritis, unspecified: Secondary | ICD-10-CM

## 2022-08-11 DIAGNOSIS — E2839 Other primary ovarian failure: Secondary | ICD-10-CM

## 2022-08-11 DIAGNOSIS — E559 Vitamin D deficiency, unspecified: Secondary | ICD-10-CM | POA: Diagnosis not present

## 2022-08-11 DIAGNOSIS — Z7185 Encounter for immunization safety counseling: Secondary | ICD-10-CM | POA: Diagnosis not present

## 2022-08-11 DIAGNOSIS — I10 Essential (primary) hypertension: Secondary | ICD-10-CM

## 2022-08-11 DIAGNOSIS — Z0000 Encounter for general adult medical examination without abnormal findings: Secondary | ICD-10-CM | POA: Diagnosis not present

## 2022-08-11 DIAGNOSIS — R2681 Unsteadiness on feet: Secondary | ICD-10-CM

## 2022-08-11 DIAGNOSIS — R7301 Impaired fasting glucose: Secondary | ICD-10-CM

## 2022-08-11 DIAGNOSIS — Z1231 Encounter for screening mammogram for malignant neoplasm of breast: Secondary | ICD-10-CM

## 2022-08-11 DIAGNOSIS — K635 Polyp of colon: Secondary | ICD-10-CM

## 2022-08-11 DIAGNOSIS — M1711 Unilateral primary osteoarthritis, right knee: Secondary | ICD-10-CM

## 2022-08-11 DIAGNOSIS — Z9181 History of falling: Secondary | ICD-10-CM

## 2022-08-11 DIAGNOSIS — D649 Anemia, unspecified: Secondary | ICD-10-CM

## 2022-08-11 DIAGNOSIS — E782 Mixed hyperlipidemia: Secondary | ICD-10-CM

## 2022-08-11 DIAGNOSIS — G3184 Mild cognitive impairment, so stated: Secondary | ICD-10-CM

## 2022-08-11 NOTE — Progress Notes (Signed)
Subjective:    Nancy Castro is a 71 y.o. female who presents for Preventative Services visit and chronic medical problems/med check visit.    Primary Care Provider Tysinger, Camelia Eng, PA-C here for primary care  Current Health Care Team: Dentist, Dr. Sharia Reeve Eye doctor, Dr. Cheron Every Crafter Dr. Jenkins Rouge, cardiology Dr. Arlice Colt, neurology  Medical Services you may have received from other than [facility] providers in the past year (date may be approximate) None  Exercise Current exercise habits:  [facility] once a week for exercise    Nutrition/Diet Current diet: in general, a "healthy" diet    Depression Screen    07/17/2022    3:15 PM  Depression screen PHQ 2/9  Decreased Interest 0  Down, Depressed, Hopeless 0  PHQ - 2 Score 0  Altered sleeping 0  Tired, decreased energy 0  Change in appetite 0  Feeling bad or failure about yourself  0  Trouble concentrating 0  Moving slowly or fidgety/restless 0  Suicidal thoughts 0  PHQ-9 Score 0  Difficult doing work/chores Not difficult at all    Activities of Daily Living Screen/Functional Status Survey    Can patient draw a clock face showing 3:15 o'clock, had AWV already  Fall Risk Screen    07/17/2022    3:15 PM 07/17/2021    9:55 AM 06/11/2021   10:03 AM 06/09/2021    9:29 AM 04/18/2021    9:59 AM  [place] in the past year? 0 0 0 0 0  Number falls in past yr: 0 0 0 0 0  Injury with Fall? 0 0 0 0 0  Risk for fall due to : Medication side effect   No Fall Risks   Follow up Falls prevention discussed;Education provided;Falls evaluation completed Falls evaluation completed  Falls evaluation completed     Gait Assessment: Normal gait observed yes  Advanced directives Does patient have a [place]? Yes Does patient have a Living Will? Yes  Past Medical History:  Diagnosis Date   DDD (degenerative disc disease), cervical    H/O echocardiogram 2007   History of exercise stress  test 2007   Hyperlipidemia    Hypertension    Mixed dyslipidemia    Osteoarthritis    Syncope 2012   cardiac stress test    Wears glasses    Wears partial dentures    upper and lower    Past Surgical History:  Procedure Laterality Date   COLONOSCOPY     2014, polyps, repeat 3 years   COLONOSCOPY  07/14/2018   tubular adenoma polyps, repeat 3 years, Dr. Ulice Dash Pyrtle   KNEE ARTHROSCOPY     right   LIPOMA EXCISION     back x 2   PARTIAL HYSTERECTOMY     fibroids; has both ovaries    Social History   Socioeconomic History   Marital status: Divorced    Spouse name: Not on file   Number of children: 2   Years of education: 11   Highest education level: Not on file  Occupational History   Occupation: Retired  Tobacco Use   Smoking status: Former    Packs/day: 0.25    Years: 5.00    Total pack years: 1.25    Types: Cigarettes    Quit date: 02/08/1987    Years since quitting: 35.5   Smokeless tobacco: Former    Types: Chew   Tobacco comments:    Stopped chewing years  ago.  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  No grandchildren.   Sews - hobby.  06/2021   Right handed   1 cup per day of caffeine use:    Sister [name] in [place] checks in on her regularly as well as her daughter [name]   Social Determinants of Health   Financial Resource Strain: Low Risk  (07/17/2022)   Overall Financial Resource Strain (CARDIA)    Difficulty of Paying Living Expenses: Not hard at all  Food Insecurity: No Food Insecurity (07/17/2022)   Hunger Vital Sign    Worried About Running Out of Food in the  Last Year: Never true    Ran Out of Food in the Last Year: Never true  Transportation Needs: No Transportation Needs (07/17/2022)   PRAPARE - Hydrologist (Medical): No    Lack of Transportation (Non-Medical): No  Physical Activity: Insufficiently Active (07/17/2022)   Exercise Vital Sign    Days of Exercise per Week: 1 day    Minutes of Exercise per Session: 60 min  Stress: No Stress Concern Present (07/17/2022)   [place]    Feeling of Stress : Not at all  Social Connections: Not on file  Intimate Partner Violence: Not on file    Family History  Problem Relation Age of Onset   Cancer Mother        died of breast cancer   Cancer Father        father lung cancer   Cancer Sister        lung, mets to brain   Cancer Brother        lung and stomach   Stomach cancer Brother        died at age 71   Hypertension Maternal Grandmother    Cancer Brother        lung   Diabetes Brother    Heart disease Neg Hx    Stroke Neg Hx    Colon cancer Neg Hx    Rectal cancer Neg Hx      Current Outpatient Medications:    acetaminophen-codeine (TYLENOL #3) 300-30 MG tablet, Take 1 tablet by mouth at bedtime as needed for moderate pain., Disp: 30 tablet, Rfl: 0   atorvastatin (LIPITOR) 40 MG tablet, TAKE 1 TABLET(40 MG) BY MOUTH DAILY, Disp: 90 tablet, Rfl: 0   cholecalciferol (VITAMIN D3) 25 MCG (1000 UNIT) tablet, Take 1 tablet (1,000 Units total) by mouth daily., Disp: 90 tablet, Rfl: 3   losartan (COZAAR) 50 MG tablet, TAKE 1 TABLET(50 MG) BY MOUTH DAILY, Disp: 90 tablet, Rfl: 0   MULTIPLE VITAMIN PO, Take 1 tablet by mouth daily. , Disp: , Rfl:    niacin 250 MG tablet, Take 2 tablets (500 mg total) by mouth at bedtime. 2 tablets OTC daily, Disp: 180 tablet, Rfl: 3  No Known Allergies  History reviewed: allergies, current medications, past family history, past medical history, past social  history, past surgical history and problem list  Chronic issues discussed: Hyperlipidemia-compliant with medication  Hypertension-compliant with medication  Still has some knee arthritis.  Uses Tylenol maybe once daily for pain.  Lives alone.  Still driving, mowing, doing all of her ADLs and keeping up with the house and   Acute issues discussed: No new issues    Objective:     Biometrics BP 116/62   Pulse 72   Ht '5\' 1"'$  (1.549 m)   Wt 133 lb 9.6 oz (60.6 kg)   SpO2 98%   BMI 25.24 kg/m   Cognitive Testing  Alert? Yes  Normal Appearance?Yes  Oriented to person? Yes  Place? Yes   Time? Yes  Recall of three objects?  Yes  Can perform simple calculations? Yes  Displays appropriate judgment?Yes  Can read the correct time from a watch face?Yes  General appearance: alert, no distress, WD/WN, African American female  Nutritional Status: Inadequate calore intake? no Loss of muscle mass? no Loss of fat beneath skin? no Localized or general edema? no Diminished functional status? no  Other pertinent exam: HEENT: normocephalic, sclerae anicteric, TMs pearly, nares patent, no discharge or erythema, pharynx normal Oral cavity: MMM, no lesions Neck: supple, no lymphadenopathy, no thyromegaly, no masses Heart: RRR, normal S1, S2, no murmurs Lungs: CTA bilaterally, no wheezes, rhonchi, or rales Abdomen: +bs, soft, non tender, non distended, no masses, no hepatomegaly, no splenomegaly Musculoskeletal: bone knee fingins c/w arthritis of bilat knees, creptius and popping noted with knee extension on the right, no obvious sweling, no laxiety, otherwise MSK nontender, no swelling, no obvious deformity Extremities: no edema, no cyanosis, no clubbing Pulses: 2+ symmetric, upper and lower extremities, normal cap refill Neurological: alert, oriented x 3, CN2-12 intact, strength normal upper extremities and lower extremities, sensation normal throughout, DTRs 2+ throughout, no cerebellar  signs, gait normal Psychiatric: normal affect, behavior normal, pleasant  Breast/gyn - deferrred/declined    Assessment:   Encounter Diagnoses  Name Primary?   Routine general medical examination at a health care facility Yes   Vitamin D deficiency    Vaccine counseling    Postmenopausal    Primary osteoarthritis involving multiple joints    Polyp of colon, unspecified part of colon, unspecified type    Mixed dyslipidemia    Mild cognitive impairment    Impaired fasting blood sugar    Essential hypertension    Gait instability    History of fall    Estrogen deficiency    Anemia, unspecified type    Arthritis of right knee    Encounter for screening mammogram for malignant neoplasm of breast      Plan:   This visit was a preventative care visit, also known as wellness visit or routine physical.   Topics typically include healthy lifestyle, diet, exercise, preventative care, vaccinations, sick and well care, proper use of emergency dept and after hours care, as well as other concerns.     Recommendations: Continue to return yearly for your annual wellness and preventative care visits.  This gives [place] a chance to discuss healthy lifestyle, exercise, vaccinations, review your chart record, and perform screenings where appropriate.  I recommend you see your eye doctor yearly for routine vision care.  I recommend you see your dentist yearly for routine dental care including hygiene visits twice yearly.   Vaccination recommendations were reviewed Immunization History  Administered Date(s) Administered   Influenza, High Dose Seasonal PF 08/07/2015, 08/20/2016, 08/16/2018, 07/12/2019, 07/09/2020, 07/24/2022   Influenza, Seasonal, Injecte, Preservative Fre 10/10/2012   Influenza-Unspecified 09/12/2014,  07/30/2017, 08/16/2018   Moderna Covid-19 Vaccine Bivalent Booster 13yr & up 02/10/2022   Moderna Sars-Covid-2 Vaccination 12/17/2019, 01/15/2020, 09/17/2020   Pneumococcal  Conjugate-13 06/20/2020   Pneumococcal Polysaccharide-23 08/07/2015, 06/09/2021   Zoster Recombinat (Shingrix) 02/24/2022, 04/28/2022   Get your Tdap and Covid vaccine at the pharmacy soon   Screening for cancer: Colon cancer screening: Referral back to GI as you are due for f/u  Breast cancer screening: You should perform a self breast exam monthly.   We reviewed recommendations for regular mammograms and breast cancer screening.   Skin cancer screening: Check your skin regularly for new changes, growing lesions, or other lesions of concern Come in for evaluation if you have skin lesions of concern.  Lung cancer screening: If you have a greater than 20 pack year history of tobacco use, then you may qualify for lung cancer screening with a chest CT scan.   Please call your insurance company to inquire about coverage for this test.  We currently don't have screenings for other cancers besides breast, cervical, colon, and lung cancers.  If you have a strong family history of cancer or have other cancer screening concerns, please let me know.    Bone health: Get at least 150 minutes of aerobic exercise weekly Get weight bearing exercise at least once weekly Bone density test:  A bone density test is an imaging test that uses a type of X-ray to measure the amount of calcium and other minerals in your bones. The test may be used to diagnose or screen you for a condition that causes weak or thin bones (osteoporosis), predict your risk for a broken bone (fracture), or determine how well your osteoporosis treatment is working. The bone density test is recommended for females 664and older, or females or males <<33if certain risk factors such as thyroid disease, long term use of steroids such as for asthma or rheumatological issues, vitamin D deficiency, estrogen deficiency, family history of osteoporosis, self or family history of fragility fracture in first degree relative.  You can do a  repeat bone density test next year 2024 at the time of your mammogram   Heart health: Get at least 150 minutes of aerobic exercise weekly Limit alcohol It is important to maintain a healthy blood pressure and healthy cholesterol numbers  Heart disease screening: Screening for heart disease includes screening for blood pressure, fasting lipids, glucose/diabetes screening, BMI height to weight ratio, reviewed of smoking status, physical activity, and diet.    Goals include blood pressure 120/80 or less, maintaining a healthy lipid/cholesterol profile, preventing diabetes or keeping diabetes numbers under good control, not smoking or using tobacco products, exercising most days per week or at least 150 minutes per week of exercise, and eating healthy variety of fruits and vegetables, healthy oils, and avoiding unhealthy food choices like fried food, fast food, high sugar and high cholesterol foods.    Other tests may possibly include EKG test, CT coronary calcium score, echocardiogram, exercise treadmill stress test.    Medical care options: I recommend you continue to seek care here first for routine care.  We try really hard to have available appointments Monday through Friday daytime hours for sick visits, acute visits, and physicals.  Urgent care should be used for after hours and weekends for significant issues that cannot wait till the next day.  The emergency department should be used for significant potentially life-threatening emergencies.  The emergency department is expensive, can often have long wait times for  less significant concerns, so try to utilize primary care, urgent care, or telemedicine when possible to avoid unnecessary trips to the emergency department.  Virtual visits and telemedicine have been introduced since the pandemic started in 2020, and can be convenient ways to receive medical care.  We offer virtual appointments as well to assist you in a variety of options to seek  medical care.   Advanced Directives: I recommend you consider completing a [place] and Living Will.   These documents respect your wishes and help alleviate burdens on your loved ones if you were to become terminally ill or be in a position to need those documents enforced.    You can complete Advanced Directives yourself, have them notarized, then have copies made for our office, for you and for anybody you feel should have them in safe keeping.  Or, you can have an attorney prepare these documents.   If you haven't updated your Last Will and Testament in a while, it may be worthwhile having an attorney prepare these documents together and save on some costs.        Specific Concerns: Hyperlipidemia-continue cholesterol medicine as usual, atorvastatin and niacin  Hypertension-continue your losartan blood pressure medicine as usual  Continue vitamin D supplement  Knee arthritis-continue Tylenol as needed or over-the-counter topical creams such as Aspercreme.  If worsening we can get updated x-rays and do steroid injection if needed   Jersie was seen today for fasting cpe.  Diagnoses and all orders for this visit:  Routine general medical examination at a health care facility -     Comprehensive metabolic panel -     CBC -     Lipid panel -     VITAMIN D 25 Hydroxy (Vit-D Deficiency, Fractures) -     Hemoglobin A1c  Vitamin D deficiency -     VITAMIN D 25 Hydroxy (Vit-D Deficiency, Fractures)  Vaccine counseling  Postmenopausal  Primary osteoarthritis involving multiple joints  Polyp of colon, unspecified part of colon, unspecified type  Mixed dyslipidemia -     Lipid panel  Mild cognitive impairment  Impaired fasting blood sugar -     Hemoglobin A1c  Essential hypertension  Gait instability  History of fall  Estrogen deficiency -     DG Bone Density; Future  Anemia, unspecified type  Arthritis of right knee  Encounter for screening  mammogram for malignant neoplasm of breast -     MM DIGITAL SCREENING BILATERAL; Future      Medicare Attestation A preventative services visit was completed today.  During the course of the visit the patient was educated and counseled about appropriate screening and preventive services.  A health risk assessment was established with the patient that included a review of current medications, allergies, social history, family history, medical and preventative health history, biometrics, and preventative screenings to identify potential safety concerns or impairments.  A personalized plan was printed today for the patient's records and use.   Personalized health advice and education was given today to reduce health risks and promote self management and wellness.  Information regarding end of life planning was discussed today.  Dorothea Ogle, PA-C   08/11/2022

## 2022-08-11 NOTE — Patient Instructions (Signed)
This visit was a preventative care visit, also known as wellness visit or routine physical.   Topics typically include healthy lifestyle, diet, exercise, preventative care, vaccinations, sick and well care, proper use of emergency dept and after hours care, as well as other concerns.     Recommendations: Continue to return yearly for your annual wellness and preventative care visits.  This gives [place] a chance to discuss healthy lifestyle, exercise, vaccinations, review your chart record, and perform screenings where appropriate.  I recommend you see your eye doctor yearly for routine vision care.  I recommend you see your dentist yearly for routine dental care including hygiene visits twice yearly.   Vaccination recommendations were reviewed Immunization History  Administered Date(s) Administered   Influenza, High Dose Seasonal PF 08/07/2015, 08/20/2016, 08/16/2018, 07/12/2019, 07/09/2020, 07/24/2022   Influenza, Seasonal, Injecte, Preservative Fre 10/10/2012   Influenza-Unspecified 09/12/2014, 07/30/2017, 08/16/2018   Moderna Covid-19 Vaccine Bivalent Booster 33yr & up 02/10/2022   Moderna Sars-Covid-2 Vaccination 12/17/2019, 01/15/2020, 09/17/2020   Pneumococcal Conjugate-13 06/20/2020   Pneumococcal Polysaccharide-23 08/07/2015, 06/09/2021   Zoster Recombinat (Shingrix) 02/24/2022, 04/28/2022   Get your Tdap and Covid vaccine at the pharmacy soon   Screening for cancer: Colon cancer screening: Referral back to GI as you are due for f/u  Breast cancer screening: You should perform a self breast exam monthly.   We reviewed recommendations for regular mammograms and breast cancer screening.   Skin cancer screening: Check your skin regularly for new changes, growing lesions, or other lesions of concern Come in for evaluation if you have skin lesions of concern.  Lung cancer screening: If you have a greater than 20 pack year history of tobacco use, then you may qualify for lung  cancer screening with a chest CT scan.   Please call your insurance company to inquire about coverage for this test.  We currently don't have screenings for other cancers besides breast, cervical, colon, and lung cancers.  If you have a strong family history of cancer or have other cancer screening concerns, please let me know.    Bone health: Get at least 150 minutes of aerobic exercise weekly Get weight bearing exercise at least once weekly Bone density test:  A bone density test is an imaging test that uses a type of X-ray to measure the amount of calcium and other minerals in your bones. The test may be used to diagnose or screen you for a condition that causes weak or thin bones (osteoporosis), predict your risk for a broken bone (fracture), or determine how well your osteoporosis treatment is working. The bone density test is recommended for females 64and older, or females or males <<16if certain risk factors such as thyroid disease, long term use of steroids such as for asthma or rheumatological issues, vitamin D deficiency, estrogen deficiency, family history of osteoporosis, self or family history of fragility fracture in first degree relative.  You can do a repeat bone density test next year 2024 at the time of your mammogram   Heart health: Get at least 150 minutes of aerobic exercise weekly Limit alcohol It is important to maintain a healthy blood pressure and healthy cholesterol numbers  Heart disease screening: Screening for heart disease includes screening for blood pressure, fasting lipids, glucose/diabetes screening, BMI height to weight ratio, reviewed of smoking status, physical activity, and diet.    Goals include blood pressure 120/80 or less, maintaining a healthy lipid/cholesterol profile, preventing diabetes or keeping diabetes numbers under good control, not  smoking or using tobacco products, exercising most days per week or at least 150 minutes per week of exercise,  and eating healthy variety of fruits and vegetables, healthy oils, and avoiding unhealthy food choices like fried food, fast food, high sugar and high cholesterol foods.    Other tests may possibly include EKG test, CT coronary calcium score, echocardiogram, exercise treadmill stress test.    Medical care options: I recommend you continue to seek care here first for routine care.  We try really hard to have available appointments Monday through Friday daytime hours for sick visits, acute visits, and physicals.  Urgent care should be used for after hours and weekends for significant issues that cannot wait till the next day.  The emergency department should be used for significant potentially life-threatening emergencies.  The emergency department is expensive, can often have long wait times for less significant concerns, so try to utilize primary care, urgent care, or telemedicine when possible to avoid unnecessary trips to the emergency department.  Virtual visits and telemedicine have been introduced since the pandemic started in 2020, and can be convenient ways to receive medical care.  We offer virtual appointments as well to assist you in a variety of options to seek medical care.   Advanced Directives: I recommend you consider completing a [place] and Living Will.   These documents respect your wishes and help alleviate burdens on your loved ones if you were to become terminally ill or be in a position to need those documents enforced.    You can complete Advanced Directives yourself, have them notarized, then have copies made for our office, for you and for anybody you feel should have them in safe keeping.  Or, you can have an attorney prepare these documents.   If you haven't updated your Last Will and Testament in a while, it may be worthwhile having an attorney prepare these documents together and save on some costs.        Specific  Concerns: Hyperlipidemia-continue cholesterol medicine as usual, atorvastatin and niacin  Hypertension-continue your losartan blood pressure medicine as usual  Continue vitamin D supplement  Knee arthritis-continue Tylenol as needed or over-the-counter topical creams such as Aspercreme.  If worsening we can get updated x-rays and do steroid injection if needed

## 2022-08-12 ENCOUNTER — Other Ambulatory Visit: Payer: Self-pay | Admitting: Medical

## 2022-08-12 DIAGNOSIS — E782 Mixed hyperlipidemia: Secondary | ICD-10-CM

## 2022-08-12 LAB — LIPID PANEL
Chol/HDL Ratio: 2.4 ratio (ref 0.0–4.4)
Cholesterol, Total: 219 mg/dL — ABNORMAL HIGH (ref 100–199)
HDL: 92 mg/dL (ref >39)
LDL Chol Calc (NIH): 115 mg/dL — ABNORMAL HIGH (ref 0–99)
Triglycerides: 69 mg/dL (ref 0–149)
VLDL Cholesterol Cal: 12 mg/dL (ref 5–40)

## 2022-08-12 LAB — COMPREHENSIVE METABOLIC PANEL
ALT: 13 IU/L (ref 0–32)
AST: 19 IU/L (ref 0–40)
Albumin/Globulin Ratio: 2.2 (ref 1.2–2.2)
Albumin: 4.9 g/dL — ABNORMAL HIGH (ref 3.8–4.8)
Alkaline Phosphatase: 73 IU/L (ref 44–121)
BUN/Creatinine Ratio: 19 (ref 12–28)
BUN: 13 mg/dL (ref 8–27)
Bilirubin Total: 0.5 mg/dL (ref 0.0–1.2)
CO2: 25 mmol/L (ref 20–29)
Calcium: 10.2 mg/dL (ref 8.7–10.3)
Chloride: 103 mmol/L (ref 96–106)
Creatinine, Ser: 0.68 mg/dL (ref 0.57–1.00)
Globulin, Total: 2.2 g/dL (ref 1.5–4.5)
Glucose: 98 mg/dL (ref 70–99)
Potassium: 4.3 mmol/L (ref 3.5–5.2)
Sodium: 141 mmol/L (ref 134–144)
Total Protein: 7.1 g/dL (ref 6.0–8.5)
eGFR: 92 mL/min/{1.73_m2} (ref >59)

## 2022-08-12 LAB — CBC
Hematocrit: 36.3 % (ref 34.0–46.6)
Hemoglobin: 12.3 g/dL (ref 11.1–15.9)
MCH: 31.7 pg (ref 26.6–33.0)
MCHC: 33.9 g/dL (ref 31.5–35.7)
MCV: 94 fL (ref 79–97)
Platelets: 298 10*3/uL (ref 150–450)
RBC: 3.88 x10E6/uL (ref 3.77–5.28)
RDW: 11.8 % (ref 11.7–15.4)
WBC: 3.4 10*3/uL (ref 3.4–10.8)

## 2022-08-12 LAB — HEMOGLOBIN A1C
Est. average glucose Bld gHb Est-mCnc: 103 mg/dL
Hgb A1c MFr Bld: 5.2 % (ref 4.8–5.6)

## 2022-08-12 LAB — VITAMIN D 25 HYDROXY (VIT D DEFICIENCY, FRACTURES): Vit D, 25-Hydroxy: 48.5 ng/mL (ref 30.0–100.0)

## 2022-08-12 MED ORDER — NIACIN 250 MG PO TABS: 500.0000 mg | ORAL_TABLET | Freq: Every day | ORAL | 3 refills | Status: DC

## 2022-08-12 MED ORDER — VITAMIN D 25 MCG (1000 UNIT) PO TABS: 1000.0000 [IU] | ORAL_TABLET | Freq: Every day | ORAL | 3 refills | Status: AC

## 2022-09-01 ENCOUNTER — Telehealth: Payer: Self-pay | Admitting: Medical

## 2022-09-01 NOTE — Telephone Encounter (Signed)
Pt called with confusion over medication list. She states that tylenol #3 was not on her medication list and then it has not showed up again. She is not sure if she is to take again. At one time she was given it due to pain in her knees at night. She thought it was a one time thing. Then it showed up again. Please advise pt at [phone].

## 2022-09-02 ENCOUNTER — Other Ambulatory Visit: Payer: Self-pay | Admitting: Medical

## 2022-09-02 MED ORDER — ACETAMINOPHEN-CODEINE 300-30 MG PO TABS: 1.0000 | ORAL_TABLET | Freq: Three times a day (TID) | ORAL | 0 refills | Status: DC | PRN

## 2022-09-02 NOTE — Telephone Encounter (Signed)
Pt was called and advised and she is now requesting a refill on this for occasional use. She states her pain was discussed at recent visit.

## 2022-09-14 ENCOUNTER — Other Ambulatory Visit: Payer: Self-pay | Admitting: Medical

## 2022-09-14 DIAGNOSIS — E782 Mixed hyperlipidemia: Secondary | ICD-10-CM

## 2022-10-22 ENCOUNTER — Other Ambulatory Visit: Payer: Self-pay | Admitting: Medical

## 2022-10-22 DIAGNOSIS — I10 Essential (primary) hypertension: Secondary | ICD-10-CM

## 2023-03-03 ENCOUNTER — Telehealth: Payer: Self-pay | Admitting: Medical

## 2023-03-03 DIAGNOSIS — E782 Mixed hyperlipidemia: Secondary | ICD-10-CM

## 2023-03-03 MED ORDER — ATORVASTATIN CALCIUM 40 MG PO TABS
ORAL_TABLET | ORAL | 0 refills | Status: DC
Start: 2023-03-03 — End: 2023-03-15

## 2023-03-03 NOTE — Telephone Encounter (Signed)
done

## 2023-03-03 NOTE — Telephone Encounter (Signed)
[facility] sent a message that pt needs a refill for her lipitor sent to the  [id number] - [place], [place] - [address] AT [facility]

## 2023-03-14 ENCOUNTER — Other Ambulatory Visit: Payer: Self-pay | Admitting: Medical

## 2023-03-14 DIAGNOSIS — E782 Mixed hyperlipidemia: Secondary | ICD-10-CM

## 2023-04-20 ENCOUNTER — Other Ambulatory Visit: Payer: Self-pay | Admitting: Medical

## 2023-04-20 DIAGNOSIS — I10 Essential (primary) hypertension: Secondary | ICD-10-CM

## 2023-04-27 DIAGNOSIS — H524 Presbyopia: Secondary | ICD-10-CM | POA: Diagnosis not present

## 2023-06-29 DIAGNOSIS — Z1231 Encounter for screening mammogram for malignant neoplasm of breast: Secondary | ICD-10-CM | POA: Diagnosis not present

## 2023-06-29 LAB — HM MAMMOGRAPHY

## 2023-07-01 ENCOUNTER — Encounter: Payer: Self-pay | Admitting: Internal Medicine

## 2023-07-12 ENCOUNTER — Telehealth: Payer: Self-pay

## 2023-07-12 DIAGNOSIS — I10 Essential (primary) hypertension: Secondary | ICD-10-CM

## 2023-07-12 MED ORDER — LOSARTAN POTASSIUM 50 MG PO TABS
50.0000 mg | ORAL_TABLET | Freq: Every day | ORAL | 0 refills | Status: DC
Start: 2023-07-12 — End: 2023-09-08

## 2023-07-12 NOTE — Telephone Encounter (Signed)
Done

## 2023-07-12 NOTE — Telephone Encounter (Signed)
Refill request faxed for losartan 50 MG by 07/18/23. Last appt. 08/11/22. Next appt. 09/07/23.

## 2023-09-07 ENCOUNTER — Ambulatory Visit (INDEPENDENT_AMBULATORY_CARE_PROVIDER_SITE_OTHER): Payer: No Typology Code available for payment source | Admitting: Medical

## 2023-09-07 ENCOUNTER — Encounter: Payer: Self-pay | Admitting: Medical

## 2023-09-07 VITALS — BP 120/76 | HR 65 | Wt 128.0 lb

## 2023-09-07 DIAGNOSIS — M150 Primary generalized (osteo)arthritis: Secondary | ICD-10-CM | POA: Diagnosis not present

## 2023-09-07 DIAGNOSIS — I10 Essential (primary) hypertension: Secondary | ICD-10-CM

## 2023-09-07 DIAGNOSIS — G3184 Mild cognitive impairment, so stated: Secondary | ICD-10-CM | POA: Diagnosis not present

## 2023-09-07 DIAGNOSIS — Z780 Asymptomatic menopausal state: Secondary | ICD-10-CM

## 2023-09-07 DIAGNOSIS — M8580 Other specified disorders of bone density and structure, unspecified site: Secondary | ICD-10-CM | POA: Insufficient documentation

## 2023-09-07 DIAGNOSIS — Z7185 Encounter for immunization safety counseling: Secondary | ICD-10-CM | POA: Diagnosis not present

## 2023-09-07 DIAGNOSIS — E782 Mixed hyperlipidemia: Secondary | ICD-10-CM

## 2023-09-07 DIAGNOSIS — H524 Presbyopia: Secondary | ICD-10-CM | POA: Diagnosis not present

## 2023-09-07 DIAGNOSIS — Z0000 Encounter for general adult medical examination without abnormal findings: Secondary | ICD-10-CM | POA: Diagnosis not present

## 2023-09-07 DIAGNOSIS — Z1211 Encounter for screening for malignant neoplasm of colon: Secondary | ICD-10-CM | POA: Diagnosis not present

## 2023-09-07 DIAGNOSIS — Z23 Encounter for immunization: Secondary | ICD-10-CM | POA: Diagnosis not present

## 2023-09-07 DIAGNOSIS — E559 Vitamin D deficiency, unspecified: Secondary | ICD-10-CM

## 2023-09-07 DIAGNOSIS — H52223 Regular astigmatism, bilateral: Secondary | ICD-10-CM | POA: Diagnosis not present

## 2023-09-07 DIAGNOSIS — E2839 Other primary ovarian failure: Secondary | ICD-10-CM | POA: Diagnosis not present

## 2023-09-07 DIAGNOSIS — R7301 Impaired fasting glucose: Secondary | ICD-10-CM

## 2023-09-07 NOTE — Patient Instructions (Signed)
This visit was a preventative care visit, also known as wellness visit or routine physical.   Topics typically include healthy lifestyle, diet, exercise, preventative care, vaccinations, sick and well care, proper use of emergency dept and after hours care, as well as other concerns.     Recommendations: Continue to return yearly for your annual wellness and preventative care visits.  This gives [place] a chance to discuss healthy lifestyle, exercise, vaccinations, review your chart record, and perform screenings where appropriate.  I recommend you see your eye doctor yearly for routine vision care.  I recommend you see your dentist yearly for routine dental care including hygiene visits twice yearly.   Vaccination recommendations were reviewed Immunization History  Administered Date(s) Administered   Influenza, High Dose Seasonal PF 08/07/2015, 08/20/2016, 08/16/2018, 07/12/2019, 07/09/2020, 07/24/2022, 06/15/2023   Influenza, Seasonal, Injecte, Preservative Fre 10/10/2012   Influenza-Unspecified 09/12/2014, 07/30/2017, 08/16/2018   Moderna Covid-19 Vaccine Bivalent Booster 99yrs & up 02/10/2022   Moderna Sars-Covid-2 Vaccination 12/17/2019, 01/15/2020, 09/17/2020   Pneumococcal Conjugate-13 06/20/2020   Pneumococcal Polysaccharide-23 08/07/2015, 06/09/2021   Zoster Recombinant(Shingrix) 02/24/2022, 04/28/2022   Please update your tetanus booster at your pharmacy soon  Counseled on the Covid virus vaccine.  Vaccine information sheet given.  Covid vaccine given after consent obtained.    Screening for cancer: Colon cancer screening: Complete the cologuard stool kit  Breast cancer screening: You should perform a self breast exam monthly.   We reviewed recommendations for regular mammograms and breast cancer screening.   Skin cancer screening: Check your skin regularly for new changes, growing lesions, or other lesions of concern Come in for evaluation if you have skin lesions of  concern.  Lung cancer screening: If you have a greater than 20 pack year history of tobacco use, then you may qualify for lung cancer screening with a chest CT scan.   Please call your insurance company to inquire about coverage for this test.  We currently don't have screenings for other cancers besides breast, cervical, colon, and lung cancers.  If you have a strong family history of cancer or have other cancer screening concerns, please let me know.    Bone health: Get at least 150 minutes of aerobic exercise weekly Get weight bearing exercise at least once weekly Bone density test:  A bone density test is an imaging test that uses a type of X-ray to measure the amount of calcium and other minerals in your bones. The test may be used to diagnose or screen you for a condition that causes weak or thin bones (osteoporosis), predict your risk for a broken bone (fracture), or determine how well your osteoporosis treatment is working. The bone density test is recommended for females 65 and older, or females or males <65 if certain risk factors such as thyroid disease, long term use of steroids such as for asthma or rheumatological issues, vitamin D deficiency, estrogen deficiency, family history of osteoporosis, self or family history of fragility fracture in first degree relative.  Call and schedule bone density test at [facility] where you get your mammograms.   Heart health: Get at least 150 minutes of aerobic exercise weekly Limit alcohol It is important to maintain a healthy blood pressure and healthy cholesterol numbers  Heart disease screening: Screening for heart disease includes screening for blood pressure, fasting lipids, glucose/diabetes screening, BMI height to weight ratio, reviewed of smoking status, physical activity, and diet.    Goals include blood pressure 120/80 or less, maintaining a healthy lipid/cholesterol profile,  preventing diabetes or keeping diabetes numbers under good  control, not smoking or using tobacco products, exercising most days per week or at least 150 minutes per week of exercise, and eating healthy variety of fruits and vegetables, healthy oils, and avoiding unhealthy food choices like fried food, fast food, high sugar and high cholesterol foods.    Other tests may possibly include EKG test, CT coronary calcium score, echocardiogram, exercise treadmill stress test.    Medical care options: I recommend you continue to seek care here first for routine care.  We try really hard to have available appointments Monday through Friday daytime hours for sick visits, acute visits, and physicals.  Urgent care should be used for after hours and weekends for significant issues that cannot wait till the next day.  The emergency department should be used for significant potentially life-threatening emergencies.  The emergency department is expensive, can often have long wait times for less significant concerns, so try to utilize primary care, urgent care, or telemedicine when possible to avoid unnecessary trips to the emergency department.  Virtual visits and telemedicine have been introduced since the pandemic started in 2020, and can be convenient ways to receive medical care.  We offer virtual appointments as well to assist you in a variety of options to seek medical care.   Advanced Directives: Get [place] a copy of your living will and health care power of attorney paperwork    Specific Concerns: Hyperlipidemia-continue cholesterol medicine as usual, atorvastatin and niacin  Hypertension-continue your losartan blood pressure medicine as usual  Continue vitamin D supplement  Arthiritis  You can use Tylenol by mouth for pain as needed You can use topical creams such as capsaicin cream or Aspercreme If your fingers get worse locking up and then we can refer you to hand surgery or orthopedics

## 2023-09-07 NOTE — Progress Notes (Signed)
Subjective:    Nancy Castro is a 72 y.o. female who presents for well visit  Primary Care Provider Javyon Fontan, Kermit Balo, PA-C here for primary care  Current Health Care Team: Dentist, Dr. Arvilla Market Eye doctor, Dr. Clare Charon Crafter Dr. Charlton Haws, cardiology Dr. Despina Arias, neurology  Exercise Current exercise habits: mowing grass, walking  Nutrition/Diet Current diet: in general, a "healthy" diet    Depression Screen    09/07/2023   10:35 AM  Depression screen PHQ 2/9  Decreased Interest 0  Down, Depressed, Hopeless 0  PHQ - 2 Score 0    Activities of Daily Living Screen/Functional Status Survey Is the patient deaf or have difficulty hearing?: No Does the patient have difficulty seeing, even when wearing glasses/contacts?: No Does the patient have difficulty concentrating, remembering, or making decisions?: No Does the patient have difficulty walking or climbing stairs?: No Does the patient have difficulty dressing or bathing?: No Does the patient have difficulty doing errands alone such as visiting a doctor's office or shopping?: No  Can patient draw a clock face showing 3:15 o'clock, had AWV already  Fall Risk Screen    09/07/2023   10:35 AM 07/17/2022    3:15 PM 07/17/2021    9:55 AM 06/11/2021   10:03 AM 06/09/2021    9:29 AM  Fall Risk   Falls in the past year? 0 0 0 0 0  Number falls in past yr: 0 0 0 0 0  Injury with Fall? 0 0 0 0 0  Risk for fall due to :  Medication side effect   No Fall Risks  Follow up  Falls prevention discussed;Education provided;Falls evaluation completed Falls evaluation completed  Falls evaluation completed     Past Medical History:  Diagnosis Date   DDD (degenerative disc disease), cervical    H/O echocardiogram 2007   History of exercise stress test 2007   Hyperlipidemia    Hypertension    Mixed dyslipidemia    Osteoarthritis    Syncope 2012   cardiac stress test    Wears glasses    Wears partial dentures    upper and  lower    Past Surgical History:  Procedure Laterality Date   COLONOSCOPY     2014, polyps, repeat 3 years   COLONOSCOPY  07/14/2018   tubular adenoma polyps, repeat 3 years, Dr. Erick Blinks   KNEE ARTHROSCOPY     right   LIPOMA EXCISION     back x 2   PARTIAL HYSTERECTOMY     fibroids; has both ovaries     Family History  Problem Relation Age of Onset   Cancer Mother        died of breast cancer   Cancer Father        father lung cancer   Cancer Sister        lung, mets to brain   Cancer Brother        lung and stomach   Stomach cancer Brother        died at age 55   Hypertension Maternal Grandmother    Cancer Brother        lung   Diabetes Brother    Heart disease Neg Hx    Stroke Neg Hx    Colon cancer Neg Hx    Rectal cancer Neg Hx      Current Outpatient Medications:    acetaminophen-codeine (TYLENOL #3) 300-30 MG tablet, Take 1 tablet by mouth at bedtime as needed  for moderate pain., Disp: 30 tablet, Rfl: 0   acetaminophen-codeine (TYLENOL #3) 300-30 MG tablet, Take 1 tablet by mouth every 8 (eight) hours as needed for moderate pain., Disp: 30 tablet, Rfl: 0   atorvastatin (LIPITOR) 40 MG tablet, TAKE 1 TABLET(40 MG) BY MOUTH DAILY, Disp: 90 tablet, Rfl: 0   cholecalciferol (VITAMIN D3) 25 MCG (1000 UNIT) tablet, Take 1 tablet (1,000 Units total) by mouth daily. (Patient taking differently: Take 1,000 Units by mouth daily. OTC), Disp: 90 tablet, Rfl: 3   losartan (COZAAR) 50 MG tablet, Take 1 tablet (50 mg total) by mouth daily., Disp: 90 tablet, Rfl: 0   MULTIPLE VITAMIN PO, Take 1 tablet by mouth daily. , Disp: , Rfl:    niacin (VITAMIN B3) 250 MG tablet, Take 2 tablets (500 mg total) by mouth at bedtime. 2 tablets OTC daily, Disp: 180 tablet, Rfl: 3  No Known Allergies  History reviewed: allergies, current medications, past family history, past medical history, past social history, past surgical history and problem list   Chronic issues  discussed: Sometimes fingers feel like they want to lock up, but not staying locked all the time.  Hyperlipidemia-compliant with medication  Hypertension-compliant with medication  Lives alone.   drives, mowing, doing all of her ADLs and keeping up with the house and    Acute issues discussed: No new issues    Objective:     Biometrics BP 120/76   Pulse 65   Wt 128 lb (58.1 kg)   SpO2 98%   BMI 24.19 kg/m   Wt Readings from Last 3 Encounters:  09/07/23 128 lb (58.1 kg)  08/11/22 133 lb 9.6 oz (60.6 kg)  07/17/22 134 lb (60.8 kg)   Gen: wd, wn, nad HEENT: normocephalic, sclerae anicteric, TMs pearly, nares patent, no discharge or erythema, pharynx normal Oral cavity: MMM, no lesions Neck: supple, no lymphadenopathy, no thyromegaly, no masses Heart: RRR, normal S1, S2, no murmurs Lungs: CTA bilaterally, no wheezes, rhonchi, or rales Abdomen: +bs, soft, non tender, non distended, no masses, no hepatomegaly, no splenomegaly Musculoskeletal: Bony arthritic significant changes of several joints of both hands and both knees, no obvious swelling, no laxity, otherwise MSK nontender, no swelling, no obvious deformity Extremities: no edema, no cyanosis, no clubbing Pulses: 2+ symmetric, upper and lower extremities, normal cap refill Neurological: alert, oriented x 3, CN2-12 intact, strength normal upper extremities and lower extremities, sensation normal throughout, DTRs 2+ throughout, no cerebellar signs, gait normal Psychiatric: normal affect, behavior normal, pleasant  Breast/gyn - deferred/declined     Assessment:   Encounter Diagnoses  Name Primary?   Routine general medical examination at a health care facility Yes   Essential hypertension    Estrogen deficiency    Impaired fasting blood sugar    Mild cognitive impairment    Mixed dyslipidemia    Postmenopausal    Vitamin D deficiency    Vaccine counseling    Primary osteoarthritis involving multiple joints     Osteopenia, unspecified location    Screening for colon cancer    Need for COVID-19 vaccine       Plan:   This visit was a preventative care visit, also known as wellness visit or routine physical.   Topics typically include healthy lifestyle, diet, exercise, preventative care, vaccinations, sick and well care, proper use of emergency dept and after hours care, as well as other concerns.     Recommendations: Continue to return yearly for your annual wellness and preventative care visits.  This gives [place] a chance to discuss healthy lifestyle, exercise, vaccinations, review your chart record, and perform screenings where appropriate.  I recommend you see your eye doctor yearly for routine vision care.  I recommend you see your dentist yearly for routine dental care including hygiene visits twice yearly.   Vaccination recommendations were reviewed Immunization History  Administered Date(s) Administered   Influenza, High Dose Seasonal PF 08/07/2015, 08/20/2016, 08/16/2018, 07/12/2019, 07/09/2020, 07/24/2022, 06/15/2023   Influenza, Seasonal, Injecte, Preservative Fre 10/10/2012   Influenza-Unspecified 09/12/2014, 07/30/2017, 08/16/2018   Moderna Covid-19 Vaccine Bivalent Booster 38yrs & up 02/10/2022   Moderna Sars-Covid-2 Vaccination 12/17/2019, 01/15/2020, 09/17/2020   Pfizer(Comirnaty)Fall Seasonal Vaccine 12 years and older 09/07/2023   Pneumococcal Conjugate-13 06/20/2020   Pneumococcal Polysaccharide-23 08/07/2015, 06/09/2021   Zoster Recombinant(Shingrix) 02/24/2022, 04/28/2022   Please update your tetanus booster at your pharmacy soon  Counseled on the Covid virus vaccine.  Vaccine information sheet given.  Covid vaccine given after consent obtained.    Screening for cancer: Colon cancer screening: Complete the cologuard stool kit  Breast cancer screening: You should perform a self breast exam monthly.   We reviewed recommendations for regular mammograms and breast  cancer screening.   Skin cancer screening: Check your skin regularly for new changes, growing lesions, or other lesions of concern Come in for evaluation if you have skin lesions of concern.  Lung cancer screening: If you have a greater than 20 pack year history of tobacco use, then you may qualify for lung cancer screening with a chest CT scan.   Please call your insurance company to inquire about coverage for this test.  We currently don't have screenings for other cancers besides breast, cervical, colon, and lung cancers.  If you have a strong family history of cancer or have other cancer screening concerns, please let me know.    Bone health: Get at least 150 minutes of aerobic exercise weekly Get weight bearing exercise at least once weekly Bone density test:  A bone density test is an imaging test that uses a type of X-ray to measure the amount of calcium and other minerals in your bones. The test may be used to diagnose or screen you for a condition that causes weak or thin bones (osteoporosis), predict your risk for a broken bone (fracture), or determine how well your osteoporosis treatment is working. The bone density test is recommended for females 65 and older, or females or males <65 if certain risk factors such as thyroid disease, long term use of steroids such as for asthma or rheumatological issues, vitamin D deficiency, estrogen deficiency, family history of osteoporosis, self or family history of fragility fracture in first degree relative.  Call and schedule bone density test at [facility] where you get your mammograms.   Heart health: Get at least 150 minutes of aerobic exercise weekly Limit alcohol It is important to maintain a healthy blood pressure and healthy cholesterol numbers  Heart disease screening: Screening for heart disease includes screening for blood pressure, fasting lipids, glucose/diabetes screening, BMI height to weight ratio, reviewed of smoking status,  physical activity, and diet.    Goals include blood pressure 120/80 or less, maintaining a healthy lipid/cholesterol profile, preventing diabetes or keeping diabetes numbers under good control, not smoking or using tobacco products, exercising most days per week or at least 150 minutes per week of exercise, and eating healthy variety of fruits and vegetables, healthy oils, and avoiding unhealthy food choices like fried food, fast food, high sugar and  high cholesterol foods.    Other tests may possibly include EKG test, CT coronary calcium score, echocardiogram, exercise treadmill stress test.    Medical care options: I recommend you continue to seek care here first for routine care.  We try really hard to have available appointments Monday through Friday daytime hours for sick visits, acute visits, and physicals.  Urgent care should be used for after hours and weekends for significant issues that cannot wait till the next day.  The emergency department should be used for significant potentially life-threatening emergencies.  The emergency department is expensive, can often have long wait times for less significant concerns, so try to utilize primary care, urgent care, or telemedicine when possible to avoid unnecessary trips to the emergency department.  Virtual visits and telemedicine have been introduced since the pandemic started in 2020, and can be convenient ways to receive medical care.  We offer virtual appointments as well to assist you in a variety of options to seek medical care.   Advanced Directives: Get [place] a copy of your living will and health care power of attorney paperwork    Specific Concerns: Hyperlipidemia-continue cholesterol medicine as usual, atorvastatin and niacin  Hypertension-continue your losartan blood pressure medicine as usual  Continue vitamin D supplement  Arthiritis  You can use Tylenol by mouth for pain as needed You can use topical creams such as capsaicin  cream or Aspercreme If your fingers get worse locking up and then we can refer you to hand surgery or orthopedics     Julien was seen today for annual exam and arthritis.  Diagnoses and all orders for this visit:  Routine general medical examination at a health care facility -     DG Bone Density; Future -     Cologuard -     Comprehensive metabolic panel -     CBC -     Lipid panel -     VITAMIN D 25 Hydroxy (Vit-D Deficiency, Fractures) -     Hemoglobin A1c  Essential hypertension  Estrogen deficiency -     DG Bone Density; Future  Impaired fasting blood sugar -     Hemoglobin A1c  Mild cognitive impairment  Mixed dyslipidemia -     Lipid panel  Postmenopausal -     DG Bone Density; Future  Vitamin D deficiency -     VITAMIN D 25 Hydroxy (Vit-D Deficiency, Fractures)  Vaccine counseling  Primary osteoarthritis involving multiple joints -     DG Bone Density; Future  Osteopenia, unspecified location -     DG Bone Density; Future  Screening for colon cancer -     Cologuard  Need for COVID-19 vaccine -     Pfizer Comirnaty Covid -19 Vaccine 68yrs and older    F/u pending labs, studies

## 2023-09-07 NOTE — Progress Notes (Signed)
Called and left voicemail with [facility] fax number

## 2023-09-08 ENCOUNTER — Other Ambulatory Visit: Payer: Self-pay | Admitting: Medical

## 2023-09-08 DIAGNOSIS — I10 Essential (primary) hypertension: Secondary | ICD-10-CM

## 2023-09-08 DIAGNOSIS — E782 Mixed hyperlipidemia: Secondary | ICD-10-CM

## 2023-09-08 LAB — LIPID PANEL
Chol/HDL Ratio: 2.4 ratio (ref 0.0–4.4)
Cholesterol, Total: 207 mg/dL — ABNORMAL HIGH (ref 100–199)
HDL: 85 mg/dL (ref >39)
LDL Chol Calc (NIH): 110 mg/dL — ABNORMAL HIGH (ref 0–99)
Triglycerides: 66 mg/dL (ref 0–149)
VLDL Cholesterol Cal: 12 mg/dL (ref 5–40)

## 2023-09-08 LAB — CBC
Hematocrit: 37.4 % (ref 34.0–46.6)
Hemoglobin: 12.4 g/dL (ref 11.1–15.9)
MCH: 31.5 pg (ref 26.6–33.0)
MCHC: 33.2 g/dL (ref 31.5–35.7)
MCV: 95 fL (ref 79–97)
Platelets: 276 10*3/uL (ref 150–450)
RBC: 3.94 x10E6/uL (ref 3.77–5.28)
RDW: 13 % (ref 11.7–15.4)
WBC: 3.2 10*3/uL — ABNORMAL LOW (ref 3.4–10.8)

## 2023-09-08 LAB — COMPREHENSIVE METABOLIC PANEL
ALT: 11 [IU]/L (ref 0–32)
AST: 18 IU/L (ref 0–40)
Albumin: 4.4 g/dL (ref 3.8–4.8)
Alkaline Phosphatase: 82 IU/L (ref 44–121)
BUN/Creatinine Ratio: 11 — ABNORMAL LOW (ref 12–28)
BUN: 7 mg/dL — ABNORMAL LOW (ref 8–27)
Bilirubin Total: 0.5 mg/dL (ref 0.0–1.2)
CO2: 24 mmol/L (ref 20–29)
Calcium: 10 mg/dL (ref 8.7–10.3)
Chloride: 103 mmol/L (ref 96–106)
Creatinine, Ser: 0.64 mg/dL (ref 0.57–1.00)
Globulin, Total: 2.2 g/dL (ref 1.5–4.5)
Glucose: 96 mg/dL (ref 70–99)
Potassium: 4.2 mmol/L (ref 3.5–5.2)
Sodium: 139 mmol/L (ref 134–144)
Total Protein: 6.6 g/dL (ref 6.0–8.5)
eGFR: 93 mL/min/{1.73_m2} (ref >59)

## 2023-09-08 LAB — HEMOGLOBIN A1C
Est. average glucose Bld gHb Est-mCnc: 108 mg/dL
Hgb A1c MFr Bld: 5.4 % (ref 4.8–5.6)

## 2023-09-08 LAB — VITAMIN D 25 HYDROXY (VIT D DEFICIENCY, FRACTURES): Vit D, 25-Hydroxy: 50.9 ng/mL (ref 30.0–100.0)

## 2023-09-08 MED ORDER — NIACIN 250 MG PO TABS
500.0000 mg | ORAL_TABLET | Freq: Every day | ORAL | 3 refills | Status: AC
Start: 2023-09-08 — End: ?

## 2023-09-08 MED ORDER — LOSARTAN POTASSIUM 50 MG PO TABS: 50.0000 mg | ORAL_TABLET | Freq: Every day | ORAL | 3 refills | Status: DC

## 2023-09-08 MED ORDER — ATORVASTATIN CALCIUM 40 MG PO TABS: ORAL_TABLET | ORAL | 3 refills | Status: DC

## 2023-09-08 NOTE — Progress Notes (Signed)
Results sent through MyChart

## 2023-09-13 DIAGNOSIS — Z1211 Encounter for screening for malignant neoplasm of colon: Secondary | ICD-10-CM | POA: Diagnosis not present

## 2023-09-19 LAB — COLOGUARD: COLOGUARD: NEGATIVE

## 2023-09-20 NOTE — Progress Notes (Signed)
Results sent through MyChart

## 2023-09-23 NOTE — Progress Notes (Signed)
 Please call as they have not reviewed my chart results

## 2023-10-04 DIAGNOSIS — M8580 Other specified disorders of bone density and structure, unspecified site: Secondary | ICD-10-CM | POA: Diagnosis not present

## 2023-10-04 DIAGNOSIS — M8588 Other specified disorders of bone density and structure, other site: Secondary | ICD-10-CM | POA: Diagnosis not present

## 2023-10-04 DIAGNOSIS — M069 Rheumatoid arthritis, unspecified: Secondary | ICD-10-CM | POA: Diagnosis not present

## 2023-10-04 LAB — HM DEXA SCAN

## 2023-10-08 ENCOUNTER — Telehealth: Payer: Self-pay | Admitting: Medical

## 2023-10-08 NOTE — Telephone Encounter (Signed)
Your bone density report shows osteopenia or low bone mass.  I recommend exercise regularly including aerobic and weight bearing exercise.  Aerobic exercise includes things like hiking, swimming, jogging, aerobics, pickleball and tennis.  Weightbearing exercise includes circuit training, free weight training, body weight calisthenics such as push-ups and pull-ups and DIPs and other weightbearing exercise.   You can return to discuss this further or you could also do a consult with a fitness or exercise facility such as the [facility] if you are unsure about how to incorporate this into your routine.   Weight-bearing physical activity and exercises that improve balance and posture can strengthen bones and reduce the chance of a fracture. The more active and fit you are as you age, the less likely you are to fall and break a bone.   Good nutrition. Eat a healthy diet and make certain that you're getting 1200mg  of calcium daily in the diet from dairy (typically 4 servings) or supplements OTC.   Also they should be getting Vitamin D through eating fish, seafood, getting sun exposure, and using either OTC Vitamin D supplement such as 1000-2000 IU daily   Lets plan to repeat the bone density study in 2 years.  I have included additional information as below

## 2023-10-08 NOTE — Telephone Encounter (Signed)
Pt was notified of results

## 2024-06-26 DIAGNOSIS — H524 Presbyopia: Secondary | ICD-10-CM | POA: Diagnosis not present

## 2024-06-26 DIAGNOSIS — H52223 Regular astigmatism, bilateral: Secondary | ICD-10-CM | POA: Diagnosis not present

## 2024-06-26 DIAGNOSIS — H5203 Hypermetropia, bilateral: Secondary | ICD-10-CM | POA: Diagnosis not present

## 2024-07-04 DIAGNOSIS — Z1231 Encounter for screening mammogram for malignant neoplasm of breast: Secondary | ICD-10-CM | POA: Diagnosis not present

## 2024-07-04 LAB — HM MAMMOGRAPHY

## 2024-07-07 ENCOUNTER — Ambulatory Visit: Payer: Self-pay | Admitting: Internal Medicine

## 2024-07-12 DIAGNOSIS — R928 Other abnormal and inconclusive findings on diagnostic imaging of breast: Secondary | ICD-10-CM | POA: Diagnosis not present

## 2024-07-12 LAB — HM MAMMOGRAPHY

## 2024-07-14 ENCOUNTER — Encounter: Payer: Self-pay | Admitting: Medical

## 2024-07-14 ENCOUNTER — Ambulatory Visit: Payer: Self-pay | Admitting: Medical

## 2024-07-26 ENCOUNTER — Encounter: Payer: Self-pay | Admitting: Internal Medicine

## 2024-07-26 ENCOUNTER — Ambulatory Visit: Payer: Self-pay | Admitting: Medical

## 2024-07-26 NOTE — Progress Notes (Signed)
 abstract

## 2024-08-30 ENCOUNTER — Other Ambulatory Visit: Payer: Self-pay | Admitting: Medical

## 2024-08-30 DIAGNOSIS — E782 Mixed hyperlipidemia: Secondary | ICD-10-CM

## 2024-08-30 DIAGNOSIS — I10 Essential (primary) hypertension: Secondary | ICD-10-CM

## 2024-08-30 NOTE — Telephone Encounter (Signed)
 Pt is due for her CPE in November with [facility]. Please schedule

## 2024-09-26 ENCOUNTER — Ambulatory Visit: Payer: Self-pay

## 2024-09-27 ENCOUNTER — Telehealth: Payer: Self-pay | Admitting: Medical

## 2024-09-27 NOTE — Telephone Encounter (Signed)
 The handicap placard form.  Get her set up for yearly physical.  She is due at this time

## 2024-09-27 NOTE — Telephone Encounter (Signed)
 Pt was notified to pick up form

## 2024-10-16 ENCOUNTER — Encounter: Payer: Self-pay | Admitting: Medical

## 2024-10-16 ENCOUNTER — Ambulatory Visit: Admitting: Medical

## 2024-10-16 VITALS — BP 120/70 | HR 67 | Ht 61.0 in | Wt 129.6 lb

## 2024-10-16 DIAGNOSIS — M8580 Other specified disorders of bone density and structure, unspecified site: Secondary | ICD-10-CM | POA: Diagnosis not present

## 2024-10-16 DIAGNOSIS — Z1389 Encounter for screening for other disorder: Secondary | ICD-10-CM

## 2024-10-16 DIAGNOSIS — E2839 Other primary ovarian failure: Secondary | ICD-10-CM

## 2024-10-16 DIAGNOSIS — E782 Mixed hyperlipidemia: Secondary | ICD-10-CM | POA: Diagnosis not present

## 2024-10-16 DIAGNOSIS — R7301 Impaired fasting glucose: Secondary | ICD-10-CM | POA: Diagnosis not present

## 2024-10-16 DIAGNOSIS — D179 Benign lipomatous neoplasm, unspecified: Secondary | ICD-10-CM | POA: Insufficient documentation

## 2024-10-16 DIAGNOSIS — Z0000 Encounter for general adult medical examination without abnormal findings: Secondary | ICD-10-CM | POA: Diagnosis not present

## 2024-10-16 DIAGNOSIS — K635 Polyp of colon: Secondary | ICD-10-CM

## 2024-10-16 DIAGNOSIS — G3184 Mild cognitive impairment, so stated: Secondary | ICD-10-CM | POA: Diagnosis not present

## 2024-10-16 DIAGNOSIS — Z7185 Encounter for immunization safety counseling: Secondary | ICD-10-CM | POA: Diagnosis not present

## 2024-10-16 DIAGNOSIS — E559 Vitamin D deficiency, unspecified: Secondary | ICD-10-CM

## 2024-10-16 DIAGNOSIS — I10 Essential (primary) hypertension: Secondary | ICD-10-CM | POA: Diagnosis not present

## 2024-10-16 DIAGNOSIS — M150 Primary generalized (osteo)arthritis: Secondary | ICD-10-CM

## 2024-10-16 NOTE — Patient Instructions (Signed)
 A preventative services visit was completed today.  During the course of the visit today, we discussed and counseled about appropriate screening and preventive services.  A health risk assessment was established today that included a review of current medications, allergies, social history, family history, medical and preventative health history, biometrics, and preventative screenings to identify potential safety concerns or impairments.  A personalized plan was printed today for your records and use.   Personalized health advice and education was given today to reduce health risks and promote self management and wellness.  Information regarding end of life planning was discussed today.   This visit was a preventative care visit, also known as wellness visit or routine physical.   Topics typically include healthy lifestyle, diet, exercise, preventative care, vaccinations, sick and well care, proper use of emergency dept and after hours care, as well as other concerns.     Recommendations: Continue to return yearly for your annual wellness and preventative care visits.  This gives us  a chance to discuss healthy lifestyle, exercise, vaccinations, review your chart record, and perform screenings where appropriate.  I recommend you see your eye doctor yearly for routine vision care.  I recommend you see your dentist yearly for routine dental care including hygiene visits twice yearly.   Vaccination recommendations were reviewed Immunization History  Administered Date(s) Administered   INFLUENZA, HIGH DOSE SEASONAL PF 08/07/2015, 08/20/2016, 08/16/2018, 07/12/2019, 07/09/2020, 07/24/2022, 06/15/2023, 07/03/2024   Influenza, Seasonal, Injecte, Preservative Fre 10/10/2012   Influenza-Unspecified 09/12/2014, 07/30/2017, 08/16/2018   Moderna Covid-19 Vaccine Bivalent Booster 62yrs & up 02/10/2022   Moderna Sars-Covid-2 Vaccination 12/17/2019, 01/15/2020, 09/17/2020   Pfizer(Comirnaty)Fall Seasonal  Vaccine 12 years and older 09/07/2023   Pneumococcal Conjugate-13 06/20/2020   Pneumococcal Polysaccharide-23 08/07/2015, 06/09/2021   Zoster Recombinant(Shingrix) 02/24/2022, 04/28/2022    Get your Tdap vaccine at your pharmacy   Screening for cancer: Colon cancer screening: I reviewed your colonoscopy on file that is up to date from 2019 and negative Cologuard last year 2024.  Breast cancer screening: It is up to you if you decide to continue or not continue mammograms after age 69  Skin cancer screening: Check your skin regularly for new changes, growing lesions, or other lesions of concern Come in for evaluation if you have skin lesions of concern.  Lung cancer screening: If you have a greater than 20 pack year history of tobacco use, then you may qualify for lung cancer screening with a chest CT scan.   Please call your insurance company to inquire about coverage for this test.  We currently don't have screenings for other cancers besides breast, cervical, colon, and lung cancers.  If you have a strong family history of cancer or have other cancer screening concerns, please let me know.    Bone health: Get at least 150 minutes of aerobic exercise weekly Get weight bearing exercise at least once weekly Bone density test:  A bone density test is an imaging test that uses a type of X-ray to measure the amount of calcium  and other minerals in your bones. The test may be used to diagnose or screen you for a condition that causes weak or thin bones (osteoporosis), predict your risk for a broken bone (fracture), or determine how well your osteoporosis treatment is working. The bone density test is recommended for females 65 and older, or females or males <65 if certain risk factors such as thyroid  disease, long term use of steroids such as for asthma or rheumatological issues, vitamin D   deficiency, estrogen deficiency, family history of osteoporosis, self or family history of fragility  fracture in first degree relative.  Your Bone density showed osteopenia 2024.   Heart health: Get at least 150 minutes of aerobic exercise weekly Limit alcohol It is important to maintain a healthy blood pressure and healthy cholesterol numbers  Heart disease screening: Screening for heart disease includes screening for blood pressure, fasting lipids, glucose/diabetes screening, BMI height to weight ratio, reviewed of smoking status, physical activity, and diet.    Goals include blood pressure 120/80 or less, maintaining a healthy lipid/cholesterol profile, preventing diabetes or keeping diabetes numbers under good control, not smoking or using tobacco products, exercising most days per week or at least 150 minutes per week of exercise, and eating healthy variety of fruits and vegetables, healthy oils, and avoiding unhealthy food choices like fried food, fast food, high sugar and high cholesterol foods.    Other tests may possibly include EKG test, CT coronary calcium  score, echocardiogram, exercise treadmill stress test.     Medical care options: I recommend you continue to seek care here first for routine care.  We try really hard to have available appointments Monday through Friday daytime hours for sick visits, acute visits, and physicals.  Urgent care should be used for after hours and weekends for significant issues that cannot wait till the next day.  The emergency department should be used for significant potentially life-threatening emergencies.  The emergency department is expensive, can often have long wait times for less significant concerns, so try to utilize primary care, urgent care, or telemedicine when possible to avoid unnecessary trips to the emergency department.  Virtual visits and telemedicine have been introduced since the pandemic started in 2020, and can be convenient ways to receive medical care.  We offer virtual appointments as well to assist you in a variety of  options to seek medical care.   Advanced Directives: I recommend you consider completing a Health Care Power of Attorney and Living Will.   These documents respect your wishes and help alleviate burdens on your loved ones if you were to become terminally ill or be in a position to need those documents enforced.    You can complete Advanced Directives yourself, have them notarized, then have copies made for our office, for you and for anybody you feel should have them in safe keeping.  Or, you can have an attorney prepare these documents.   If you haven't updated your Last Will and Testament in a while, it may be worthwhile having an attorney prepare these documents together and save on some costs.      Separate significant issues: Hyperlipidemia-continue atorvastatin  Lipitor 40 mg daily, continue over-the-counter niacin  to 50 mg, 2 tablets daily.  Updated labs today  Hypertension-continue losartan  50 mg daily  Continue vitamin D  supplement

## 2024-10-16 NOTE — Progress Notes (Signed)
 Subjective:    Nancy Castro is a 73 y.o. female who presents for Preventative Services visit and chronic medical problems/med check visit.    Primary Care Provider Leighanna Kirn, Alm RAMAN, PA-C here for primary care  Current Health Care Team: Dr. Charlie Crete, neurology prior Dr. Gordy Starch, GI Dentist, Eye doctor  Medical Services you may have received from other than [facility] providers in the past year (date may be approximate) Eye doctor and dentist  Nancy Castro is a 73 year old female who presents for a wellness visit.   She feels generally well with no specific concerns. Her daughter has recently moved back to the area and staying with her some.  She continues to live independently, managing her own bills, and exercises weekly at the [facility]. Her diet is healthy, consisting of two meals a day and a small evening snack, avoiding fried foods and takeout. She prefers cooking her own meals.  She is up to date with dental and eye check-ups  Her current medications include Lipitor for cholesterol, vitamin D , losartan  50 mg for blood pressure, and a multivitamin. She no longer takes over-the-counter niacin  and has no known allergies.  No issues with sleep, mood, or memory. She does not use a cane or walker but has handrails in her bathroom and a shower chair available. She mentions a persistent small knot on her arm from a previous COVID-19 vaccination, which influences her decision not to receive another COVID shot.  Exercise Current exercise habits: walking, [facility] at least once week   Nutrition/Diet Current diet: in general, a healthy diet    Depression Screen    10/16/2024    2:09 PM  Depression screen PHQ 2/9  Decreased Interest 0  Down, Depressed, Hopeless 0  PHQ - 2 Score 0    Activities of Daily Living Screen/Functional Status Survey    Can patient draw a clock face showing 3:15 oclock, yes  Fall Risk Screen    10/16/2024    2:11 PM 10/16/2024     2:09 PM 09/07/2023   10:35 AM 07/17/2022    3:15 PM 07/17/2021    9:55 AM  Fall Risk   Falls in the past year? 0 0 0 0 0  Number falls in past yr: 0 0 0 0 0  Injury with Fall? 0 0 0  0  0   Risk for fall due to : No Fall Risks No Fall Risks  Medication side effect   Follow up Falls evaluation completed Falls evaluation completed  Falls prevention discussed;Education provided;Falls evaluation completed  Falls evaluation completed      Data saved with a previous flowsheet row definition    Gait Assessment: Normal gait observed yes  Advanced directives Does patient have a Health Care Power of Attorney? No Does patient have a Living Will? No  Past Medical History:  Diagnosis Date   DDD (degenerative disc disease), cervical    H/O echocardiogram 2007   History of exercise stress test 2007   Hyperlipidemia    Hypertension    Mixed dyslipidemia    Osteoarthritis    Syncope 2012   cardiac stress test    Wears glasses    Wears partial dentures    upper and lower    Past Surgical History:  Procedure Laterality Date   COLONOSCOPY     2014, polyps, repeat 3 years   COLONOSCOPY  07/14/2018   tubular adenoma polyps, repeat 3 years, Dr. Gordy Starch   KNEE  ARTHROSCOPY     right   LIPOMA EXCISION     back x 2   PARTIAL HYSTERECTOMY     fibroids; has both ovaries    Social History   Socioeconomic History   Marital status: Divorced    Spouse name: Not on file   Number of children: 2   Years of education: 11   Highest education level: Not on file  Occupational History   Occupation: Retired  Tobacco Use   Smoking status: Former    Current packs/day: 0.00    Average packs/day: 0.3 packs/day for 5.0 years (1.3 ttl pk-yrs)    Types: Cigarettes    Start date: 02/07/1982    Quit date: 02/08/1987    Years since quitting: 37.7   Smokeless tobacco: Former    Types: Chew   Tobacco comments:    Stopped chewing years  ago.  Vaping Use   Vaping status: Never Used  Substance and  Sexual Activity   Alcohol use: No    Alcohol/week: 0.0 standard drinks of alcohol   Drug use: No   Sexual activity: Not Currently    Birth control/protection: Surgical    Comment: 1986  Other Topics Concern   Not on file  Social History Narrative   ** Merged History Encounter ** Divorced, has 2 children, exercise - treadmill, most days of the week, retired, oceanographer prior.  No grandchildren.   Sews - hobby.   Right handed   1 cup per day of caffeine use:    Sister Sharlet Schimke in [place] checks in on her regularly as well as her daughter Ricka   09/2023   Social Drivers of Health   Tobacco Use: Medium Risk (10/16/2024)   Patient History    Smoking Tobacco Use: Former    Smokeless Tobacco Use: Former    Passive Exposure: Not on Actuary Strain: Low Risk (07/17/2022)   Overall Financial Resource Strain (CARDIA)    Difficulty of Paying Living Expenses: Not hard at all  Food Insecurity: No Food Insecurity (10/16/2024)   Epic    Worried About Programme Researcher, Broadcasting/film/video in the Last Year: Never true    Ran Out of Food in the Last Year: Never true  Transportation Needs: No Transportation Needs (10/16/2024)   Epic    Lack of Transportation (Medical): No    Lack of Transportation (Non-Medical): No  Physical Activity: Insufficiently Active (10/16/2024)   Exercise Vital Sign    Days of Exercise per Week: 2 days    Minutes of Exercise per Session: 60 min  Stress: No Stress Concern Present (10/16/2024)   [employer] - Occupational Stress Questionnaire    Feeling of Stress: Not at all  Social Connections: Moderately Isolated (10/16/2024)   Social Connection and Isolation Panel    Frequency of Communication with Friends and Family: More than three times a week    Frequency of Social Gatherings with Friends and Family: Once a week    Attends Religious Services: More than 4 times per year    Active Member of [employer] or  Organizations: No    Attends Banker Meetings: Never    Marital Status: Divorced  Catering Manager Violence: Not At Risk (10/16/2024)   Epic    Fear of Current or Ex-Partner: No    Emotionally Abused: No    Physically Abused: No    Sexually Abused: No  Depression (PHQ2-9): Low Risk (10/16/2024)   Depression (PHQ2-9)  PHQ-2 Score: 0  Alcohol Screen: Not on file  Housing: Low Risk (10/16/2024)   Epic    Unable to Pay for Housing in the Last Year: No    Number of Times Moved in the Last Year: 0    Homeless in the Last Year: No  Utilities: Not At Risk (10/16/2024)   Epic    Threatened with loss of utilities: No  Health Literacy: Adequate Health Literacy (10/16/2024)   B1300 Health Literacy    Frequency of need for help with medical instructions: Never    Family History  Problem Relation Age of Onset   Cancer Mother        died of breast cancer   Cancer Father        father lung cancer   Cancer Sister        lung, mets to brain   Cancer Brother        lung and stomach   Stomach cancer Brother        died at age 49   Hypertension Maternal Grandmother    Cancer Brother        lung   Diabetes Brother    Heart disease Neg Hx    Stroke Neg Hx    Colon cancer Neg Hx    Rectal cancer Neg Hx     Current Medications[1]  Allergies[2]  History reviewed: allergies, current medications, past family history, past medical history, past social history, past surgical history and problem list  Acute issues discussed: none  Objective:      Biometrics BP 120/70   Pulse 67   Ht 5' 1 (1.549 m)   Wt 129 lb 9.6 oz (58.8 kg)   SpO2 98%   BMI 24.49 kg/m   Gen: wd, wn, nad, A&O x 3 Skin: Right deltoid superior laterally with about a 2 cm diameter fatty lump subcutaneous suggestive of lipoma, right low back paraspinal with a large approximately 6 to 7 cm diameter fatty lump subcutaneous suggestive of lipoma unchanged for years.  No other worrisome  lesions HEENT: normocephalic, sclerae anicteric,  nares patent, no discharge or erythema, pharynx normal Oral cavity: MMM, no lesions Neck: supple, no lymphadenopathy, no thyromegaly, no masses, no bruits Heart: RRR, normal S1, S2, no murmurs Lungs: CTA bilaterally, no wheezes, rhonchi, or rales Abdomen: +bs, soft, non tender, non distended, no masses, no hepatomegaly, no splenomegaly Musculoskeletal: nontender, no swelling, no obvious deformity Extremities: no edema, no cyanosis, no clubbing Pulses: 2+ symmetric, upper and lower extremities, normal cap refill Neurological: alert, oriented x 3, CN2-12 intact, strength normal upper extremities and lower extremities, sensation normal throughout, DTRs 2+ throughout, no cerebellar signs, gait normal Psychiatric: normal affect, behavior normal, pleasant  Breats/gyn - deferred    Assessment:   Encounter Diagnoses  Name Primary?   Routine general medical examination at a health care facility Yes   Vitamin D  deficiency    Vaccine counseling    Osteopenia, unspecified location    Polyp of colon, unspecified part of colon, unspecified type    Mixed dyslipidemia    Mild cognitive impairment    Essential hypertension    Estrogen deficiency    Impaired fasting blood sugar    Primary osteoarthritis involving multiple joints    Encounter for subsequent annual wellness visit (AWV) in Medicare patient    Screening for hematuria or proteinuria      Plan:   A preventative services visit was completed today.  During the course of the visit today,  we discussed and counseled about appropriate screening and preventive services.  A health risk assessment was established today that included a review of current medications, allergies, social history, family history, medical and preventative health history, biometrics, and preventative screenings to identify potential safety concerns or impairments.  A personalized plan was printed today for your  records and use.   Personalized health advice and education was given today to reduce health risks and promote self management and wellness.  Information regarding end of life planning was discussed today.   This visit was a preventative care visit, also known as wellness visit or routine physical.   Topics typically include healthy lifestyle, diet, exercise, preventative care, vaccinations, sick and well care, proper use of emergency dept and after hours care, as well as other concerns.     Recommendations: Continue to return yearly for your annual wellness and preventative care visits.  This gives us  a chance to discuss healthy lifestyle, exercise, vaccinations, review your chart record, and perform screenings where appropriate.  I recommend you see your eye doctor yearly for routine vision care.  I recommend you see your dentist yearly for routine dental care including hygiene visits twice yearly.   Vaccination recommendations were reviewed Immunization History  Administered Date(s) Administered   INFLUENZA, HIGH DOSE SEASONAL PF 08/07/2015, 08/20/2016, 08/16/2018, 07/12/2019, 07/09/2020, 07/24/2022, 06/15/2023, 07/03/2024   Influenza, Seasonal, Injecte, Preservative Fre 10/10/2012   Influenza-Unspecified 09/12/2014, 07/30/2017, 08/16/2018   Moderna Covid-19 Vaccine Bivalent Booster 65yrs & up 02/10/2022   Moderna Sars-Covid-2 Vaccination 12/17/2019, 01/15/2020, 09/17/2020   Pfizer(Comirnaty)Fall Seasonal Vaccine 12 years and older 09/07/2023   Pneumococcal Conjugate-13 06/20/2020   Pneumococcal Polysaccharide-23 08/07/2015, 06/09/2021   Zoster Recombinant(Shingrix) 02/24/2022, 04/28/2022    Get your Tdap vaccine at your pharmacy   Screening for cancer: Colon cancer screening: I reviewed your colonoscopy on file that is up to date from 2019 and negative Cologuard last year 2024.  Breast cancer screening: It is up to you if you decide to continue or not continue mammograms after  age 30  Skin cancer screening: Check your skin regularly for new changes, growing lesions, or other lesions of concern Come in for evaluation if you have skin lesions of concern.  Lung cancer screening: If you have a greater than 20 pack year history of tobacco use, then you may qualify for lung cancer screening with a chest CT scan.   Please call your insurance company to inquire about coverage for this test.  We currently don't have screenings for other cancers besides breast, cervical, colon, and lung cancers.  If you have a strong family history of cancer or have other cancer screening concerns, please let me know.    Bone health: Get at least 150 minutes of aerobic exercise weekly Get weight bearing exercise at least once weekly Bone density test:  A bone density test is an imaging test that uses a type of X-ray to measure the amount of calcium  and other minerals in your bones. The test may be used to diagnose or screen you for a condition that causes weak or thin bones (osteoporosis), predict your risk for a broken bone (fracture), or determine how well your osteoporosis treatment is working. The bone density test is recommended for females 65 and older, or females or males <65 if certain risk factors such as thyroid  disease, long term use of steroids such as for asthma or rheumatological issues, vitamin D  deficiency, estrogen deficiency, family history of osteoporosis, self or family history of fragility fracture  in first degree relative.  Your Bone density showed osteopenia 2024.   Heart health: Get at least 150 minutes of aerobic exercise weekly Limit alcohol It is important to maintain a healthy blood pressure and healthy cholesterol numbers  Heart disease screening: Screening for heart disease includes screening for blood pressure, fasting lipids, glucose/diabetes screening, BMI height to weight ratio, reviewed of smoking status, physical activity, and diet.    Goals include  blood pressure 120/80 or less, maintaining a healthy lipid/cholesterol profile, preventing diabetes or keeping diabetes numbers under good control, not smoking or using tobacco products, exercising most days per week or at least 150 minutes per week of exercise, and eating healthy variety of fruits and vegetables, healthy oils, and avoiding unhealthy food choices like fried food, fast food, high sugar and high cholesterol foods.    Other tests may possibly include EKG test, CT coronary calcium  score, echocardiogram, exercise treadmill stress test.     Medical care options: I recommend you continue to seek care here first for routine care.  We try really hard to have available appointments Monday through Friday daytime hours for sick visits, acute visits, and physicals.  Urgent care should be used for after hours and weekends for significant issues that cannot wait till the next day.  The emergency department should be used for significant potentially life-threatening emergencies.  The emergency department is expensive, can often have long wait times for less significant concerns, so try to utilize primary care, urgent care, or telemedicine when possible to avoid unnecessary trips to the emergency department.  Virtual visits and telemedicine have been introduced since the pandemic started in 2020, and can be convenient ways to receive medical care.  We offer virtual appointments as well to assist you in a variety of options to seek medical care.   Advanced Directives: I recommend you consider completing a Health Care Power of Attorney and Living Will.   These documents respect your wishes and help alleviate burdens on your loved ones if you were to become terminally ill or be in a position to need those documents enforced.    You can complete Advanced Directives yourself, have them notarized, then have copies made for our office, for you and for anybody you feel should have them in safe keeping.  Or,  you can have an attorney prepare these documents.   If you haven't updated your Last Will and Testament in a while, it may be worthwhile having an attorney prepare these documents together and save on some costs.      Separate significant issues: Hyperlipidemia-continue atorvastatin  Lipitor 40 mg daily, continue over-the-counter niacin  to 50 mg, 2 tablets daily.  Updated labs today  Hypertension-continue losartan  50 mg daily  Continue vitamin D  supplement      Raini was seen today for annual exam.  Diagnoses and all orders for this visit:  Routine general medical examination at a health care facility -     Comprehensive metabolic panel with GFR -     CBC -     Lipid panel -     TSH -     Urinalysis, Routine w reflex microscopic -     VITAMIN D  25 Hydroxy (Vit-D Deficiency, Fractures) -     Hemoglobin A1c  Vitamin D  deficiency -     VITAMIN D  25 Hydroxy (Vit-D Deficiency, Fractures)  Vaccine counseling  Osteopenia, unspecified location  Polyp of colon, unspecified part of colon, unspecified type  Mixed dyslipidemia -  Lipid panel  Mild cognitive impairment  Essential hypertension -     Urinalysis, Routine w reflex microscopic  Estrogen deficiency  Impaired fasting blood sugar -     Hemoglobin A1c  Primary osteoarthritis involving multiple joints  Encounter for subsequent annual wellness visit (AWV) in Medicare patient  Screening for hematuria or proteinuria -     Urinalysis, Routine w reflex microscopic     Medicare Attestation A preventative services visit was completed today.  During the course of the visit the patient was educated and counseled about appropriate screening and preventive services.  A health risk assessment was established with the patient that included a review of current medications, allergies, social history, family history, medical and preventative health history, biometrics, and preventative screenings to identify potential safety  concerns or impairments.  A personalized plan was printed today for the patient's records and use.   Personalized health advice and education was given today to reduce health risks and promote self management and wellness.  Information regarding end of life planning was discussed today.  Ludie Gent, PA-C   10/16/2024      [1]  Current Outpatient Medications:    atorvastatin  (LIPITOR) 40 MG tablet, TAKE 1 TABLET(40 MG) BY MOUTH DAILY, Disp: 90 tablet, Rfl: 0   cholecalciferol  (VITAMIN D3) 25 MCG (1000 UNIT) tablet, Take 1 tablet (1,000 Units total) by mouth daily., Disp: 90 tablet, Rfl: 3   losartan  (COZAAR ) 50 MG tablet, TAKE 1 TABLET(50 MG) BY MOUTH DAILY, Disp: 90 tablet, Rfl: 0   MULTIPLE VITAMIN PO, Take 1 tablet by mouth daily. , Disp: , Rfl:    niacin  (VITAMIN B3) 250 MG tablet, Take 2 tablets (500 mg total) by mouth at bedtime. 2 tablets OTC daily, Disp: 180 tablet, Rfl: 3 [2] No Known Allergies

## 2024-10-17 ENCOUNTER — Other Ambulatory Visit: Payer: Self-pay | Admitting: Medical

## 2024-10-17 ENCOUNTER — Ambulatory Visit: Payer: Self-pay | Admitting: Medical

## 2024-10-17 LAB — VITAMIN D 25 HYDROXY (VIT D DEFICIENCY, FRACTURES): Vit D, 25-Hydroxy: 37.6 ng/mL (ref 30.0–100.0)

## 2024-10-17 LAB — COMPREHENSIVE METABOLIC PANEL WITH GFR
ALT: 15 IU/L (ref 0–32)
AST: 21 IU/L (ref 0–40)
Albumin: 4.6 g/dL (ref 3.8–4.8)
Alkaline Phosphatase: 90 IU/L (ref 49–135)
BUN/Creatinine Ratio: 12 (ref 12–28)
BUN: 9 mg/dL (ref 8–27)
Bilirubin Total: 0.4 mg/dL (ref 0.0–1.2)
CO2: 22 mmol/L (ref 20–29)
Calcium: 10.4 mg/dL — ABNORMAL HIGH (ref 8.7–10.3)
Chloride: 101 mmol/L (ref 96–106)
Creatinine, Ser: 0.76 mg/dL (ref 0.57–1.00)
Globulin, Total: 2.3 g/dL (ref 1.5–4.5)
Glucose: 86 mg/dL (ref 70–99)
Potassium: 4.2 mmol/L (ref 3.5–5.2)
Sodium: 138 mmol/L (ref 134–144)
Total Protein: 6.9 g/dL (ref 6.0–8.5)
eGFR: 82 mL/min/1.73 (ref >59)

## 2024-10-17 LAB — CBC
Hematocrit: 39.4 % (ref 34.0–46.6)
Hemoglobin: 12.7 g/dL (ref 11.1–15.9)
MCH: 31.5 pg (ref 26.6–33.0)
MCHC: 32.2 g/dL (ref 31.5–35.7)
MCV: 98 fL — ABNORMAL HIGH (ref 79–97)
Platelets: 257 x10E3/uL (ref 150–450)
RBC: 4.03 x10E6/uL (ref 3.77–5.28)
RDW: 11.6 % — ABNORMAL LOW (ref 11.7–15.4)
WBC: 5.3 x10E3/uL (ref 3.4–10.8)

## 2024-10-17 LAB — LIPID PANEL
Chol/HDL Ratio: 2.5 ratio (ref 0.0–4.4)
Cholesterol, Total: 208 mg/dL — ABNORMAL HIGH (ref 100–199)
HDL: 82 mg/dL (ref >39)
LDL Chol Calc (NIH): 112 mg/dL — ABNORMAL HIGH (ref 0–99)
Triglycerides: 81 mg/dL (ref 0–149)
VLDL Cholesterol Cal: 14 mg/dL (ref 5–40)

## 2024-10-17 LAB — URINALYSIS, ROUTINE W REFLEX MICROSCOPIC
Bilirubin, UA: NEGATIVE
Glucose, UA: NEGATIVE
Ketones, UA: NEGATIVE
Leukocytes,UA: NEGATIVE
Nitrite, UA: NEGATIVE
Protein,UA: NEGATIVE
RBC, UA: NEGATIVE
Specific Gravity, UA: 1.01 (ref 1.005–1.030)
Urobilinogen, Ur: 0.2 mg/dL (ref 0.2–1.0)
pH, UA: 6.5 (ref 5.0–7.5)

## 2024-10-17 LAB — TSH: TSH: 2.52 u[IU]/mL (ref 0.450–4.500)

## 2024-10-17 LAB — HEMOGLOBIN A1C
Est. average glucose Bld gHb Est-mCnc: 108 mg/dL
Hgb A1c MFr Bld: 5.4 % (ref 4.8–5.6)

## 2024-10-17 NOTE — Telephone Encounter (Signed)
 Copied from CRM #[id number]. Topic: Clinical - Lab/Test Results >> Oct 17, 2024  1:17 PM Selinda RAMAN wrote: Reason for CRM: The patient called in for her lab results as a message was left from Mairen Wallenstein. I relayed what the provider stated and she was very appreciative.

## 2024-10-17 NOTE — Progress Notes (Signed)
 Results through MyChart

## 2024-11-29 ENCOUNTER — Other Ambulatory Visit: Payer: Self-pay | Admitting: Medical

## 2024-11-29 DIAGNOSIS — I10 Essential (primary) hypertension: Secondary | ICD-10-CM

## 2024-11-29 DIAGNOSIS — E782 Mixed hyperlipidemia: Secondary | ICD-10-CM

## 2025-10-22 ENCOUNTER — Encounter: Admitting: Medical
